# Patient Record
Sex: Female | Born: 1970 | Race: Black or African American | Hispanic: No | Marital: Single | Smoking: Never smoker
Health system: Southern US, Community
[De-identification: ages and names within clinical notes are randomized; demographics above are authoritative.]

## PROBLEM LIST (undated history)

## (undated) ENCOUNTER — Ambulatory Visit: Source: Home / Self Care

## (undated) DIAGNOSIS — I219 Acute myocardial infarction, unspecified: Secondary | ICD-10-CM

## (undated) DIAGNOSIS — C50911 Malignant neoplasm of unspecified site of right female breast: Secondary | ICD-10-CM

## (undated) DIAGNOSIS — Z803 Family history of malignant neoplasm of breast: Secondary | ICD-10-CM

## (undated) DIAGNOSIS — Z171 Estrogen receptor negative status [ER-]: Secondary | ICD-10-CM

## (undated) HISTORY — DX: Family history of malignant neoplasm of breast: Z80.3

## (undated) HISTORY — DX: Estrogen receptor negative status (ER-): Z17.1

## (undated) HISTORY — DX: Acute myocardial infarction, unspecified: I21.9

## (undated) HISTORY — DX: Malignant neoplasm of unspecified site of right female breast: C50.911

---

## 2006-04-20 ENCOUNTER — Emergency Department: Payer: Self-pay | Admitting: Internal Medicine

## 2009-11-18 ENCOUNTER — Emergency Department: Payer: Self-pay | Admitting: Unknown Physician Specialty

## 2015-08-19 DIAGNOSIS — C50911 Malignant neoplasm of unspecified site of right female breast: Secondary | ICD-10-CM

## 2015-08-19 HISTORY — DX: Malignant neoplasm of unspecified site of right female breast: C50.911

## 2017-05-19 ENCOUNTER — Ambulatory Visit
Admission: RE | Admit: 2017-05-19 | Discharge: 2017-05-19 | Disposition: A | Discharge status: Home / Self Care | Payer: Self-pay | Source: Ambulatory Visit | Attending: Oncology | Admitting: Oncology

## 2017-05-19 ENCOUNTER — Other Ambulatory Visit: Payer: Self-pay | Admitting: *Deleted

## 2017-05-19 ENCOUNTER — Ambulatory Visit: Payer: Self-pay | Attending: Oncology | Admitting: *Deleted

## 2017-05-19 ENCOUNTER — Encounter (INDEPENDENT_AMBULATORY_CARE_PROVIDER_SITE_OTHER): Payer: Self-pay

## 2017-05-19 ENCOUNTER — Encounter: Payer: Self-pay | Admitting: *Deleted

## 2017-05-19 VITALS — BP 117/68 | HR 72 | Temp 98.6°F | Ht 65.0 in | Wt 143.0 lb

## 2017-05-19 DIAGNOSIS — N63 Unspecified lump in unspecified breast: Secondary | ICD-10-CM

## 2017-05-19 NOTE — Progress Notes (Signed)
Subjective:     Patient ID: Kelsey Klein, female   DOB: 11/29/1970, 46 y.o.   MRN: [id number]  HPI   Review of Systems     Objective:   Physical Exam  Pulmonary/Chest: Right breast exhibits tenderness. Right breast exhibits no inverted nipple, no mass, no nipple discharge and no skin change. Left breast exhibits mass and tenderness. Left breast exhibits no inverted nipple, no nipple discharge and no skin change. Breasts are asymmetrical.         Assessment:     46 year old Black female referred to [facility] by Glenford Bayley, PA at the [facility] for further evaluation of a left breast mass.  Patient states she found the mass about 2 months ago on self exam and went to the clinic to have the area examined.  Denies any tenderness at the site of the lump, but states she has always had tender breast.  No family history of breast cancer.  On clinical breast exam bilateral breast are tender to palpation.  I can visually see a raised area at 9:00 left breast on visual inspection.  Bilateral breast are very dense to palpation.  I can palpate an approximate 2X2 cm non-mobile, non-tender nodule at 9:00 left breast. Taught self breast awareness.  Last pap on 03/25/17 was negative, with no HPV co-testing.  Next pap due in 3 years.  Patient has been screened for eligibility.  She does not have any insurance, Medicare or Medicaid.  She also meets financial eligibility.  Hand-out given on the Affordable Care Act.    Plan:     Bilateral diagnostic mammogram and ultrasound ordered.  If no finding on imaging will refer for surgical consultation.  Patient is agreeable to the plan.  Will follow-up per [facility] protocol.

## 2017-05-19 NOTE — Patient Instructions (Signed)
Gave patient hand-out, Women Staying Healthy, Active and Well from [facility], with education on breast health, pap smears, heart and colon health. 

## 2017-05-27 ENCOUNTER — Ambulatory Visit
Admission: RE | Admit: 2017-05-27 | Discharge: 2017-05-27 | Disposition: A | Discharge status: Home / Self Care | Payer: Self-pay | Source: Ambulatory Visit | Attending: Oncology | Admitting: Oncology

## 2017-05-27 ENCOUNTER — Other Ambulatory Visit: Payer: Self-pay | Admitting: *Deleted

## 2017-05-27 DIAGNOSIS — N63 Unspecified lump in unspecified breast: Secondary | ICD-10-CM

## 2017-05-27 HISTORY — PX: BREAST BIOPSY: SHX20

## 2017-05-28 ENCOUNTER — Telehealth: Payer: Self-pay | Admitting: *Deleted

## 2017-05-28 NOTE — Telephone Encounter (Signed)
Called patient and informed her biopsy results were in and that I'd like for her to come in tomorrow to discuss them.  She is scheduled to come in at 8:00 in the morning.  I will have her complete [facility] Medicaid application at that time.

## 2017-05-29 ENCOUNTER — Encounter: Payer: Self-pay | Admitting: *Deleted

## 2017-05-29 NOTE — Progress Notes (Signed)
  Oncology Nurse Navigator Documentation  Navigator Location: (P) [facility] (05/29/17 0900) Referral date to RadOnc/MedOnc: (P) 05/26/17 (05/29/17 0900) )Navigator Encounter Type: (P) Other (05/29/17 0900)   Abnormal Finding Date: (P) 05/19/17 (05/29/17 0900) Confirmed Diagnosis Date: (P) 05/27/17 (05/29/17 0900)                   Barriers/Navigation Needs: (P) Coordination of Care (05/29/17 0900)   Interventions: (P) Coordination of Care;Education (05/29/17 0900)   Coordination of Care: (P) Appts (05/29/17 0900) Education Method: (P) Verbal;Written (05/29/17 0900)                Time Spent with Patient: (P) 45 (05/29/17 0900)   Met with patient today to discuss her biopsy results.  Informed her of her positive results.  Offered support.  Gave patient breast cancer educational literature, "My Breast Cancer Treatment Handbook" by Josephine Igo, RN.  I have scheduled her to see Dr. Grayland Ormond on Monday 06/02/17 @ 10:00 and Dr. Bary Castilla at 1:45.  She is to call if she has any questions or needs.

## 2017-05-30 LAB — SURGICAL PATHOLOGY

## 2017-05-31 DIAGNOSIS — C50512 Malignant neoplasm of lower-outer quadrant of left female breast: Secondary | ICD-10-CM | POA: Insufficient documentation

## 2017-05-31 NOTE — Progress Notes (Signed)
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  INTERVAL HISTORY: Patient is a 46 year old female who palpated a lump in her breast approximately 3 months ago.  Patient noted it was not resolving and may be slightly larger and proceeded to get mammogram and ultrasound.  Subsequent biopsy revealed the above-stated breast cancer.  She is currently anxious, but otherwise feels well.  She has no neurologic complaints.  She denies any recent fevers.  She has a good appetite and denies weight loss.  She has no chest pain or shortness of breath.  She denies any nausea, vomiting, constipation, or diarrhea.  She has no urinary complaints.  Patient otherwise feels well and offers no further specific complaints.  REVIEW OF SYSTEMS:   Review of Systems  Constitutional: Negative.  Negative for fever, malaise/fatigue and weight loss.  Respiratory: Negative.  Negative for cough and shortness of breath.   Cardiovascular: Negative.  Negative for chest pain and leg swelling.  Gastrointestinal: Negative for abdominal pain.  Genitourinary: Negative.   Musculoskeletal: Negative.   Skin: Negative.   Neurological: Negative.  Negative for weakness.  Psychiatric/Behavioral: The patient is nervous/anxious.     As per HPI. Otherwise, a complete review of systems is negative.  PAST MEDICAL HISTORY: History reviewed. No pertinent past medical history.  PAST SURGICAL HISTORY: Past Surgical History:  Procedure Laterality Date  . BREAST BIOPSY Left 05/27/2017   U/S core Bx- path pending  . BREAST BIOPSY Left 05/27/2017   Affirm Bx- path pending    FAMILY HISTORY: Family History    Problem Relation Age of Onset  . Arthritis Mother   . Cirrhosis Father   . Hypertension Father   . Hypertension Sister   . Alzheimer's disease Maternal Grandmother   . Breast cancer Neg Hx     ADVANCED DIRECTIVES (Y/N):  N  HEALTH MAINTENANCE: Social History   Tobacco Use  . Smoking status: Never Smoker  . Smokeless tobacco: Never Used  Substance Use Topics  . Alcohol use: Yes  . Drug use: No     Colonoscopy:  PAP:  Bone density:  Lipid panel:  No Known Allergies  Current Outpatient Medications  Medication Sig Dispense Refill  . Norgestim-Eth Estrad Triphasic (ORTHO TRI-CYCLEN, 28, PO) Take by mouth.     No current facility-administered medications for this visit.     OBJECTIVE: Vitals:   06/02/17 1013  BP: 124/87  Pulse: 69  Resp: 18  Temp: 97.9 F (36.6 C)     Body mass index is 24.55 kg/m.    ECOG FS:0 - Asymptomatic  General: Well-developed, well-nourished, no acute distress. Eyes: Pink conjunctiva, anicteric sclera. HEENT: Normocephalic, moist mucous membranes, clear oropharnyx. Breast: Deferred today. Lungs: Clear to auscultation bilaterally. Heart: Regular rate and rhythm. No rubs, murmurs, or gallops. Abdomen: Soft, nontender, nondistended. No organomegaly noted, normoactive bowel sounds. Musculoskeletal: No edema, cyanosis, or clubbing. Neuro: Alert, answering all questions appropriately. Cranial nerves grossly intact. Skin: No rashes or petechiae noted. Psych: Normal affect. Lymphatics: No cervical, calvicular, axillary or inguinal LAD.   LAB RESULTS:  No results found for: NA, K, CL, CO2, GLUCOSE, BUN, CREATININE, CALCIUM, PROT, ALBUMIN, AST, ALT, ALKPHOS, BILITOT, GFRNONAA, GFRAA  No results  found for: WBC, NEUTROABS, HGB, HCT, MCV, PLT   STUDIES: US Breast Ltd Uni Left Inc Axilla  Result Date: 05/19/2017 CLINICAL DATA:  46 year old female with palpable left breast lump for approximately 2 months. EXAM: 2D DIGITAL DIAGNOSTIC  BILATERAL MAMMOGRAM WITH CAD AND ADJUNCT TOMO ULTRASOUND BILATERAL BREAST COMPARISON:  None. ACR Breast Density Category d: The breast tissue is extremely dense, which lowers the sensitivity of mammography. FINDINGS: A radiopaque BB was placed at the site of the patient's palpable abnormality in the lower inner left breast at middle depth. Pleomorphic calcifications in a linear distribution are seen deep to the radiopaque BB. They span a total of 5 cm posterior to anterior. Additionally, there is a spiculated mass and focal distortion on 3D views within the center of the calcifications. Further evaluation with ultrasound was performed. An asymmetry in the superior right breast at posterior depth on the MLO projection likely represents fibroglandular tissue on additional views. A precautionary ultrasound was performed. Coarse calcifications in the bilateral axillary lesions localized to the skin on tomosynthesis views. No other focal mammographic abnormalities are identified in either breast. Mammographic images were processed with CAD. On physical exam, I palpate an irregular, firm immobile lump in the medial left breast. Targeted ultrasound is performed, showing an irregular, spiculated hypoechoic mass with internal calcifications at the 9 o'clock position 6 cm from the nipple on the left. It measures 2.0 x 1.9 x 1.1 cm. Note is made of internal vascularity. Evaluation of the left axilla demonstrates a large vessel with slow but measurable blood flow. No suspicious lymph nodes are identified. Evaluation of the right upper outer quadrant demonstrates normal fibroglandular tissue without suspicious sonographic findings. IMPRESSION: 1. Highly suspicious left breast mass corresponding with the patient's palpable abnormality. Pleomorphic calcifications in a linear distribution extent anterior and posterior to the mass spanning approximately 5 cm. Recommendation is for a 2 area biopsy to demonstrate extent of disease. 2.  No suspicious left axillary lymphadenopathy. 3. No evidence of malignancy on the right. RECOMMENDATION: 1. Ultrasound-guided biopsy of the suspicious left breast mass at the 9 o'clock position. 2. Stereotactic biopsy of an additional group of anterior calcifications to document extent of disease. 3. If findings are consistent with malignancy, consider further evaluation with MRI for additional extent of disease given the patient's extreme breast density. If additional target is not found by MRI, and breast conservation therapy is considered, recommendation is to bracket the entire anterior and posterior extent of calcifications at the time of localization. Alternatively, additional stereotactic biopsy of posterior extent of calcifications can be considered. I have discussed the findings and recommendations with the patient. Results were also provided in writing at the conclusion of the visit. If applicable, a reminder letter will be sent to the patient regarding the next appointment. BI-RADS CATEGORY  5: Highly suggestive of malignancy. Electronically Signed   By: Kristopher Oppenheim M.D.   On: 05/19/2017 13:37   US Breast Ltd Uni Right Inc Axilla  Result Date: 05/19/2017 CLINICAL DATA:  46 year old female with palpable left breast lump for approximately 2 months. EXAM: 2D DIGITAL DIAGNOSTIC BILATERAL MAMMOGRAM WITH CAD AND ADJUNCT TOMO ULTRASOUND BILATERAL BREAST COMPARISON:  None. ACR Breast Density Category d: The breast tissue is extremely dense, which lowers the sensitivity of mammography. FINDINGS: A radiopaque BB was placed at the site of the patient's palpable abnormality in the lower inner left breast at middle depth. Pleomorphic calcifications in a linear distribution are seen deep to the radiopaque BB. They span a  total of 5 cm posterior to anterior. Additionally, there is a spiculated mass and focal distortion on 3D views within the center of the calcifications. Further evaluation with ultrasound was  performed. An asymmetry in the superior right breast at posterior depth on the MLO projection likely represents fibroglandular tissue on additional views. A precautionary ultrasound was performed. Coarse calcifications in the bilateral axillary lesions localized to the skin on tomosynthesis views. No other focal mammographic abnormalities are identified in either breast. Mammographic images were processed with CAD. On physical exam, I palpate an irregular, firm immobile lump in the medial left breast. Targeted ultrasound is performed, showing an irregular, spiculated hypoechoic mass with internal calcifications at the 9 o'clock position 6 cm from the nipple on the left. It measures 2.0 x 1.9 x 1.1 cm. Note is made of internal vascularity. Evaluation of the left axilla demonstrates a large vessel with slow but measurable blood flow. No suspicious lymph nodes are identified. Evaluation of the right upper outer quadrant demonstrates normal fibroglandular tissue without suspicious sonographic findings. IMPRESSION: 1. Highly suspicious left breast mass corresponding with the patient's palpable abnormality. Pleomorphic calcifications in a linear distribution extent anterior and posterior to the mass spanning approximately 5 cm. Recommendation is for a 2 area biopsy to demonstrate extent of disease. 2. No suspicious left axillary lymphadenopathy. 3. No evidence of malignancy on the right. RECOMMENDATION: 1. Ultrasound-guided biopsy of the suspicious left breast mass at the 9 o'clock position. 2. Stereotactic biopsy of an additional group of anterior calcifications to document extent of disease. 3. If findings are consistent with malignancy, consider further evaluation with MRI for additional extent of disease given the patient's extreme breast density. If additional target is not found by MRI, and breast conservation therapy is considered, recommendation is to bracket the entire anterior and posterior extent of  calcifications at the time of localization. Alternatively, additional stereotactic biopsy of posterior extent of calcifications can be considered. I have discussed the findings and recommendations with the patient. Results were also provided in writing at the conclusion of the visit. If applicable, a reminder letter will be sent to the patient regarding the next appointment. BI-RADS CATEGORY  5: Highly suggestive of malignancy. Electronically Signed   By: Kristopher Oppenheim M.D.   On: 05/19/2017 13:37   Ms Digital Diag Tomo Bilat  Result Date: 05/19/2017 CLINICAL DATA:  46 year old female with palpable left breast lump for approximately 2 months. EXAM: 2D DIGITAL DIAGNOSTIC BILATERAL MAMMOGRAM WITH CAD AND ADJUNCT TOMO ULTRASOUND BILATERAL BREAST COMPARISON:  None. ACR Breast Density Category d: The breast tissue is extremely dense, which lowers the sensitivity of mammography. FINDINGS: A radiopaque BB was placed at the site of the patient's palpable abnormality in the lower inner left breast at middle depth. Pleomorphic calcifications in a linear distribution are seen deep to the radiopaque BB. They span a total of 5 cm posterior to anterior. Additionally, there is a spiculated mass and focal distortion on 3D views within the center of the calcifications. Further evaluation with ultrasound was performed. An asymmetry in the superior right breast at posterior depth on the MLO projection likely represents fibroglandular tissue on additional views. A precautionary ultrasound was performed. Coarse calcifications in the bilateral axillary lesions localized to the skin on tomosynthesis views. No other focal mammographic abnormalities are identified in either breast. Mammographic images were processed with CAD. On physical exam, I palpate an irregular, firm immobile lump in the medial left breast. Targeted ultrasound is performed, showing an irregular, spiculated hypoechoic  mass with internal calcifications at the 9  o'clock position 6 cm from the nipple on the left. It measures 2.0 x 1.9 x 1.1 cm. Note is made of internal vascularity. Evaluation of the left axilla demonstrates a large vessel with slow but measurable blood flow. No suspicious lymph nodes are identified. Evaluation of the right upper outer quadrant demonstrates normal fibroglandular tissue without suspicious sonographic findings. IMPRESSION: 1. Highly suspicious left breast mass corresponding with the patient's palpable abnormality. Pleomorphic calcifications in a linear distribution extent anterior and posterior to the mass spanning approximately 5 cm. Recommendation is for a 2 area biopsy to demonstrate extent of disease. 2. No suspicious left axillary lymphadenopathy. 3. No evidence of malignancy on the right. RECOMMENDATION: 1. Ultrasound-guided biopsy of the suspicious left breast mass at the 9 o'clock position. 2. Stereotactic biopsy of an additional group of anterior calcifications to document extent of disease. 3. If findings are consistent with malignancy, consider further evaluation with MRI for additional extent of disease given the patient's extreme breast density. If additional target is not found by MRI, and breast conservation therapy is considered, recommendation is to bracket the entire anterior and posterior extent of calcifications at the time of localization. Alternatively, additional stereotactic biopsy of posterior extent of calcifications can be considered. I have discussed the findings and recommendations with the patient. Results were also provided in writing at the conclusion of the visit. If applicable, a reminder letter will be sent to the patient regarding the next appointment. BI-RADS CATEGORY  5: Highly suggestive of malignancy. Electronically Signed   By: Kristopher Oppenheim M.D.   On: 05/19/2017 13:37   Ms Digital Diag Uni Left  Result Date: 05/27/2017 CLINICAL DATA:  Post stereotactic and ultrasound-guided core needle biopsy of  left breast 9 o'clock mass and an area of calcifications in the lower inner quadrant of the left breast EXAM: DIAGNOSTIC LEFT MAMMOGRAM POST ULTRASOUND IN STEREOTACTIC CORE NEEDLE BIOPSY COMPARISON:  Previous exam(s). FINDINGS: Mammographic images were obtained following ultrasound-guided and stereotactic guided biopsy of left breast. Ultrasound-guided biopsy of left breast 9 o'clock mass containing calcifications was performed, with the wing shaped marker corresponding to the biopsy site. Stereotactic core needle biopsy of suspicious calcifications in the lower inner quadrant of the left breast was performed, corresponding to the X shaped marker. The 2 markers are 2.4 cm apart, with the biopsy sites representing the anterior and posterior aspect of malignant appearing process. There is however a separate group of indeterminate calcifications in the slightly upper inner left breast, posterior depth, which measures 1.9 cm in AP dimension. Stereotactic core needle biopsy for this group of calcifications is recommended, should breast conservation therapy is chosen. IMPRESSION: Expected post biopsy changes, post ultrasound-guided core needle biopsy of the left breast 9 o'clock mass -wing shaped marker, and a group of calcifications in the left breast lower inner quadrant -X shaped marker. A separate group of calcifications in the left breast slightly upper inner quadrant, posterior depth, for which stereotactic core needle biopsy is recommended, should breast conservation therapy is chosen. Final Assessment: Post Procedure Mammograms for Marker Placement Electronically Signed   By: Fidela Salisbury M.D.   On: 05/27/2017 14:43   Mm Lt Breast Bx W Loc Dev 1st Lesion Image Bx Spec Stereo Guide  Addendum Date: 05/30/2017   ADDENDUM REPORT: 05/29/2017 17:13 ADDENDUM: Pathology of the left breast stereotactic guided biopsy revealed B. 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 Comment: These findings were communicated to [place] in the [facility] on 05/28/17 at 2:27 PM. Read-back was performed. This was found to be concordant with Dr. Kateri Plummer impression and notes. Recommendation: Surgical and oncology referrals. Please note, clips are 2.4 cm apart, likely within the same malignancy, and represent the anterior and posterior portion of it. Bracketed NL will be needed if excision is chosen. There is a separate group of calcifications in the slightly UIQ of the left breast. A stereotactic biopsy for it is recommended, if breast conservation treatment is considered. Results were relayed to Tanya Nones, RN, nurse navigator and [facility] coordinator for [facility]. Results were given to the patient in person by Tanya Nones, RN. A surgical appointment was made for the patient with Dr. Bary Castilla for 06/02/17 at 2:00 PM and oncology appointment with Dr. Grayland Ormond for 06/02/17 at 10:00 AM. The patient was encouraged to contact Tanya Nones, RN with any further questions or concerns. Addendum by Jetta Lout, RRA on 05/29/17. Electronically Signed   By: Fidela Salisbury M.D.   On: 05/29/2017 17:13   Result Date: 05/30/2017 CLINICAL DATA:  Left breast lower inner quadrant group of calcifications. EXAM: LEFT BREAST STEREOTACTIC CORE NEEDLE BIOPSY COMPARISON:  Previous exams. FINDINGS: The patient and I discussed the procedure of stereotactic-guided biopsy including benefits and alternatives. We discussed the high likelihood of a successful procedure. We discussed the risks of the procedure including infection, bleeding, tissue injury, clip migration, and inadequate sampling. Informed written consent was given. The usual time out protocol was performed immediately prior to the procedure. Using sterile technique and 1% Lidocaine as  local anesthetic, under stereotactic guidance, a 9 gauge vacuum assisted device was used to perform core needle biopsy of calcifications in the lower inner quadrant of the left breast using a medial approach. Specimen radiograph was performed showing presence of calcifications. Specimens with calcifications are identified for pathology. Lesion quadrant: Lower inner quadrant At the conclusion of the procedure, a X shaped tissue marker clip was deployed into the biopsy cavity. Follow-up 2-view mammogram was performed and dictated separately. IMPRESSION: Stereotactic-guided biopsy of left breast lower inner quadrant calcifications. No apparent complications. Electronically Signed: By: Fidela Salisbury M.D. On: 05/27/2017 16:11   [place] Lt Breast Bx W Loc Dev 1st Lesion Img Bx Spec US Guide  Addendum Date: 05/30/2017   ADDENDUM REPORT: 05/29/2017 17:21 ADDENDUM: Pathology of the left breast ultrasound guided biopsy revealed A. BREAST, LEFT, 9 O'CLOCK 6 CM FROM NIPPLE; ULTRASOUND-GUIDED CORE BIOPSY: INVASIVE MAMMARY CARCINOMA, NO SPECIAL TYPE. Size of invasive carcinoma: 11 mm in this sample Histologic grade of invasive carcinoma: Grade 1. Ductal carcinoma in situ: Present, high nuclear grade with comedonecrosis and calcifications. Lymphovascular invasion: Not identified. ER/PR/HER2: Immunohistochemistry will be performed on block A1, with reflex to [place] for HER2 2+. The results will be reported in an addendum. Comment: These findings were communicated to [place] in the [facility] on 05/28/17 at 2:27 PM. Read-back was performed. This was found to be concordant with Dr. Kateri Plummer impression and notes. Recommendation: Surgical and oncology referrals. Please note, clips are 2.4 cm apart, likely within the same malignancy, and represent the anterior and posterior portion of it. Bracketed NL will be needed if excision is chosen. There is a separate group of calcifications in the slightly UIQ  of the left breast. A stereotactic biopsy for it is recommended, if breast conservation treatment is considered. Results were  relayed to Tanya Nones, RN, nurse navigator and [facility] coordinator. Results were given to the patient by Tanya Nones, RN in person on 05/29/17. A surgical referral was made with Dr. Bary Castilla on 06/02/17 at 2:00 PM and an oncology referral with Dr. Grayland Ormond on 06/02/17 at 10:00 AM by Tanya Nones, RN. The patient was encouraged to contact Tanya Nones, RN with any further questions or concerns. Addendum by Jetta Lout, RRA on 05/29/17. Electronically Signed   By: Fidela Salisbury M.D.   On: 05/29/2017 17:21   Result Date: 05/30/2017 CLINICAL DATA:  Left breast 9 o'clock suspicious calcifications containing mass. EXAM: ULTRASOUND GUIDED LEFT BREAST CORE NEEDLE BIOPSY COMPARISON:  Previous exam(s). FINDINGS: I met with the patient and we discussed the procedure of ultrasound-guided biopsy, including benefits and alternatives. We discussed the high likelihood of a successful procedure. We discussed the risks of the procedure, including infection, bleeding, tissue injury, clip migration, and inadequate sampling. Informed written consent was given. The usual time-out protocol was performed immediately prior to the procedure. Lesion quadrant: Lower inner quadrant. Using sterile technique and 1% Lidocaine as local anesthetic, under direct ultrasound visualization, a 14 gauge spring-loaded device was used to perform biopsy of left breast 9 o'clock mass using a inferior approach. At the conclusion of the procedure a wing shaped tissue marker clip was deployed into the biopsy cavity. Follow up 2 view mammogram was performed and dictated separately. IMPRESSION: Ultrasound guided biopsy of left breast mass. No apparent complications. Electronically Signed: By: Fidela Salisbury M.D. On: 05/27/2017 16:10    ASSESSMENT: Clinical stage Ib ER PR positive, HER-2 negative invasive lower  outer quadrant the left breast.  PLAN:    1. 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  Given the extent of DCIS, have ordered a breast MRI for further evaluation.  Patient has consultation with surgery later today to discuss treatment options, although have mentioned to the patient that she likely will require neoadjuvant chemotherapy with Adriamycin, Cytoxan, and Taxol.  Also briefly discussed possible plastic surgery for reconstruction if patient chooses mastectomy.  Prior to initiating treatment, she will require port placement.  Have also recommended genetic counseling, which patient wishes to defer until later date.  A MUGA scan has been ordered for pretreatment evaluation of her cardiac function.  Adjuvant XRT was briefly discussed today, but this will depend on whether she has a lumpectomy or mastectomy.  Finally, patient will benefit from tamoxifen for a total of 5-10 years at the completion of all her treatments.  No follow-up was scheduled today, will await surgical input as well as MRI results to determine if patient should receive adjuvant or neoadjuvant chemotherapy.  Approximately 60 minutes was spent in discussion of which greater than 50% was consultation.  Patient expressed understanding and was in agreement with this plan. She also understands that She can call clinic at any time with any questions, concerns, or complaints.   Cancer Staging Primary cancer of lower-outer quadrant of left breast [facility]) Staging form: Breast, AJCC 8th Edition - Clinical stage from 05/31/2017: Stage IB (cT2, cN0, cM0, G1, ER: Positive, PR: Positive, HER2: Negative) - Signed by Lloyd Huger, MD on 05/31/2017   Lloyd Huger, MD   06/02/2017 1:37 PM

## 2017-06-02 ENCOUNTER — Encounter: Payer: Self-pay | Admitting: Oncology

## 2017-06-02 ENCOUNTER — Telehealth: Payer: Self-pay | Admitting: *Deleted

## 2017-06-02 ENCOUNTER — Other Ambulatory Visit: Payer: Self-pay | Admitting: *Deleted

## 2017-06-02 ENCOUNTER — Encounter: Payer: Self-pay | Admitting: General Surgery

## 2017-06-02 ENCOUNTER — Inpatient Hospital Stay: Payer: Medicaid Other | Attending: Oncology | Admitting: Oncology

## 2017-06-02 ENCOUNTER — Ambulatory Visit (INDEPENDENT_AMBULATORY_CARE_PROVIDER_SITE_OTHER): Payer: Self-pay | Admitting: General Surgery

## 2017-06-02 VITALS — BP 128/72 | HR 78 | Resp 14 | Ht 64.0 in | Wt 141.0 lb

## 2017-06-02 DIAGNOSIS — Z170 Estrogen receptor positive status [ER+]: Secondary | ICD-10-CM

## 2017-06-02 DIAGNOSIS — Z793 Long term (current) use of hormonal contraceptives: Secondary | ICD-10-CM | POA: Diagnosis not present

## 2017-06-02 DIAGNOSIS — C50512 Malignant neoplasm of lower-outer quadrant of left female breast: Secondary | ICD-10-CM | POA: Insufficient documentation

## 2017-06-02 DIAGNOSIS — F419 Anxiety disorder, unspecified: Secondary | ICD-10-CM | POA: Diagnosis not present

## 2017-06-02 DIAGNOSIS — C50312 Malignant neoplasm of lower-inner quadrant of left female breast: Secondary | ICD-10-CM

## 2017-06-02 NOTE — Progress Notes (Signed)
Patient ID: Kelsey Klein, female   DOB: 09/19/1970, 46 y.o.   MRN: [id number]  Chief Complaint  Patient presents with  . Other    HPI Kelsey Klein is a 46 y.o. female who presents for a breast evaluation. The most recent mammogram was done on 05/19/2017 and biopsy on 05/27/2017.Patient does perform regular self breast checks. This was her first mammogram. She had felt a knot in her left breast 3 months prior to this. She states that the area has not changed. She denies any pain or nipple discharge. She is here today with her friend [name].    The patient is a hair stylist. She has a 42 year old daughter whose setting to be a Psychologist, sport and exercise at [employer].     HPI  History reviewed. No pertinent past medical history.  Past Surgical History:  Procedure Laterality Date  . BREAST BIOPSY Left 05/27/2017   U/S core Bx- path pending  . BREAST BIOPSY Left 05/27/2017   Affirm Bx- path pending    Family History  Problem Relation Age of Onset  . Arthritis Mother   . Cirrhosis Father   . Hypertension Father   . Hypertension Sister   . Alzheimer's disease Maternal Grandmother   . Breast cancer Neg Hx     Social History Social History   Tobacco Use  . Smoking status: Never Smoker  . Smokeless tobacco: Never Used  Substance Use Topics  . Alcohol use: Yes  . Drug use: No    No Known Allergies  Current Outpatient Medications  Medication Sig Dispense Refill  . Norgestim-Eth Estrad Triphasic (ORTHO TRI-CYCLEN, 28, PO) Take by mouth.     No current facility-administered medications for this visit.     Review of Systems Review of Systems  Constitutional: Negative.   Respiratory: Negative.   Cardiovascular: Negative.     Blood pressure 128/72, pulse 78, resp. rate 14, height 5' 4"  (1.626 m), weight 141 lb (64 kg), last menstrual period 05/06/2017.  Physical Exam Physical Exam  Constitutional: She is oriented to person, place, and time. She appears well-developed and  well-nourished.  Eyes: Conjunctivae are normal. No scleral icterus.  Neck: Neck supple.  Cardiovascular: Normal rate, regular rhythm and normal heart sounds.  Pulmonary/Chest: Effort normal and breath sounds normal. Right breast exhibits no inverted nipple, no mass, no nipple discharge, no skin change and no tenderness. Left breast exhibits mass (little thickening at 8:00 o'clk at the areola). Left breast exhibits no inverted nipple, no nipple discharge, no skin change and no tenderness.    The patient reports that her right breast is smaller than the left, "A" cup on the right, "B" cup on the left.  Lymphadenopathy:    She has no cervical adenopathy.    She has no axillary adenopathy.  Neurological: She is alert and oriented to person, place, and time.  Skin: Skin is warm and dry.  Psychiatric: She has a normal mood and affect.    Data Reviewed DIAGNOSIS:  A. BREAST, LEFT, 9 O'CLOCK 6 CM FROM NIPPLE; ULTRASOUND-GUIDED CORE  BIOPSY:  - INVASIVE MAMMARY CARCINOMA, NO SPECIAL TYPE.  Size of invasive carcinoma: 11 mm in this sample  Histologic grade of invasive carcinoma: Grade 1  Ductal carcinoma in situ: Present, high nuclear grade with  comedonecrosis and calcifications  Lymphovascular invasion: Not identified   B. BREAST, LEFT, LOWER INNER QUADRANT; STEREOTACTIC-GUIDED CORE BIOPSY:  - DUCTAL CARCINOMA IN SITU (DCIS), HIGH NUCLEAR GRADE WITH  COMEDONECROSIS AND CALCIFICATIONS, IN MULTIPLE CORE  SAMPLES.   BREAST BIOMARKER TEST RESULTS:  Estrogen Receptor (ER) Status: POSITIVE, >90% of cells with nuclear  positivity     Average intensity of staining: Moderate  Progesterone Receptor (PgR) Status: POSITIVE, >90% of cells with nuclear  positivity     Average intensity of staining: Strong  HER2 (by immunohistochemistry): NEGATIVE (Score 1+)   Mammograms an ultrasound of 05/19/2017, postprocedural mammogram of 05/27/2017 reviewed. There is however a separate group of  indeterminate calcifications in the slightly upper inner left breast, posterior depth, which measures 1.9 cm in AP dimension. Stereotactic core needle biopsy for this group of calcifications is recommended, should breast conservation therapy is chosen.  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    Plan     The majority of the visit was spent reviewing the options for breast cancer treatment. Breast conservation with lumpectomy and radiation therapy  was presented as equivalent to mastectomy for long-term control. The pros and cons of each treatment regimen were reviewed. The indications for additional therapy such as chemotherapy were touched on briefly,as the patient had met with medical oncology earlier today.  I spoke with Dr. Grayland Ormond after the patient's visit. MRIs not really required at this time as it will not necessarily change our options but would raise her likelihood of mastectomy.  The patient's tumor is just under 2 cm, and I'm not convinced that neoadjuvant chemotherapy would make her break the potential for breast conservation. I spoke with Dr. Grayland Ormond about whether Mammoprint or Oncotype testing would be appropriate, but he reported based on her age he would likely offer and encourage adjuvant chemotherapy regardless of any scoring platform.  The additional foci of microcalcifications can likely be included in the wide excision(if the patient chooses breast conservation) and I would mandate a separate biopsy of this area.  The potential for plastic surgery consultation should she choose simple mastectomy with or without immediate reconstruction was discussed.  The importance of taking adequate time to consider all of her options was reviewed. Several pertinent website addresses were provided if she is interested in pursuing that route.  The risks associated with central venous access including arterial, pulmonary and  venous injury were reviewed. The possible need for additional treatment if pulmonary injury occurs (chest tube placement) was discussed. This would be completed at the time of any surgical intervention if the patient was amenable to adjuvant chemotherapy. An informational brochure was provided.  The patient was encouraged to call should she have any questions.  The patient is presently using oral contraceptives, and this would  be contraindicated with an estrogen positive tumor.     HPI, Physical Exam, Assessment and Plan have been scribed under the direction and in the presence of Robert Bellow, MD  Concepcion Living, LPN  I have completed the exam and reviewed the above documentation for accuracy and completeness.  I agree with the above.  Haematologist has been used and any errors in dictation or transcription are unintentional.  Hervey Ard, M.D., F.A.C.S.  Robert Bellow 06/02/2017, 7:15 PM

## 2017-06-02 NOTE — Telephone Encounter (Signed)
Saw patient this morning during her initial medical oncology consult with Dr. Grayland Ormond.  He would like for the patient to have a breast MRI.  Since the patient does not have insurance or Medicaid at this time, I have called and faxed approval for breast MRI through our [employer].  I left the patient a message to return my call to inform her that [place] will call her to schedule the appointment.

## 2017-06-03 ENCOUNTER — Telehealth: Payer: Self-pay | Admitting: *Deleted

## 2017-06-03 NOTE — Telephone Encounter (Signed)
Left message for patient to call the office back

## 2017-06-03 NOTE — Telephone Encounter (Signed)
-----   Message from Robert Bellow, MD sent at 06/02/2017  7:26 PM EST ----- Please notify the patient that I spoke with Dr. Grayland Ormond, and a breast MRI is not required before any surgical intervention.

## 2017-06-03 NOTE — Telephone Encounter (Signed)
Patient called back and is aware that you spoke with Dr.Finnegan and a breast MRI is not required before any surgical intervention

## 2017-06-04 ENCOUNTER — Telehealth: Payer: Self-pay | Admitting: General Surgery

## 2017-06-04 NOTE — Telephone Encounter (Signed)
Contacted the patient see if she had any questions about the options for management of her recently diagnosed breast cancer after meeting with Dr. Grayland Ormond and myself on Monday. She was informed that an MRI was not necessary for medical or surgical management, and was no longer recommended.  Leaning toward breast conservation at present, but still considering mastectomy.  Opportunity to meet with plastic surgeon offered, even if she does not go in that direction.  Encouraged to call if she has any questions, or if she wants to move forward with surgery (partial w/ RT to follow) or mastectomy (w/o immediate reconstruction).

## 2017-06-11 ENCOUNTER — Encounter
Admission: RE | Admit: 2017-06-11 | Discharge: 2017-06-11 | Disposition: A | Discharge status: Home / Self Care | Payer: Medicaid Other | Source: Ambulatory Visit | Attending: Oncology | Admitting: Oncology

## 2017-06-11 DIAGNOSIS — C50512 Malignant neoplasm of lower-outer quadrant of left female breast: Secondary | ICD-10-CM | POA: Insufficient documentation

## 2017-06-11 MED ORDER — TECHNETIUM TC 99M-LABELED RED BLOOD CELLS IV KIT
25.0000 | PACK | Freq: Once | INTRAVENOUS | Status: AC | PRN
  Administered 2017-06-11: 22.513 via INTRAVENOUS

## 2017-06-18 ENCOUNTER — Encounter: Payer: Self-pay | Admitting: *Deleted

## 2017-06-18 NOTE — Progress Notes (Signed)
  Oncology Nurse Navigator Documentation  Navigator Location: [facility] (06/18/17 1000) Referral date to RadOnc/MedOnc: 06/18/17 (06/18/17 1000) )Navigator Encounter Type: Telephone (06/18/17 1000) Telephone: Lahoma Crocker Call (06/18/17 1000)                     Treatment Phase: Pre-Tx/Tx Discussion (06/18/17 1000)     Interventions: Referrals (06/18/17 1000)                      Time Spent with Patient: 30 (06/18/17 1000)   Patient called and wanted to discuss her birth control options.  States she needs to refill her birth control pills.  Referred to her her gyn to discuss birth control options.  I did inform her that her breast cancer is estrogen and progesterone positive and her cancer is fueled by those hormones.  Stressed importance of telling her gyn that information.  States she has a follow-up appointment with Dr. Bary Castilla tomorrow to discuss surgery options again.  She is to call if she has any questions or needs.

## 2017-06-19 ENCOUNTER — Encounter: Payer: Self-pay | Admitting: General Surgery

## 2017-06-19 ENCOUNTER — Other Ambulatory Visit: Payer: Self-pay | Admitting: *Deleted

## 2017-06-19 ENCOUNTER — Ambulatory Visit (INDEPENDENT_AMBULATORY_CARE_PROVIDER_SITE_OTHER): Payer: Self-pay | Admitting: General Surgery

## 2017-06-19 VITALS — BP 128/74 | HR 70 | Resp 12 | Ht 64.0 in | Wt 143.0 lb

## 2017-06-19 DIAGNOSIS — Z170 Estrogen receptor positive status [ER+]: Secondary | ICD-10-CM

## 2017-06-19 DIAGNOSIS — C50512 Malignant neoplasm of lower-outer quadrant of left female breast: Secondary | ICD-10-CM

## 2017-06-19 NOTE — Progress Notes (Signed)
Patient ID: Kelsey Klein, female   DOB: 1970/07/22, 47 y.o.   MRN: [id number]  Chief Complaint  Patient presents with  . Follow-up    HPI Kelsey Klein is a 47 y.o. female.  Here to discuss breast surgery. She is currently off her birth control but plans on going back on them at the end of the month.  She is here with her friend, Kelsey Klein.  HPI  No past medical history on file.  Past Surgical History:  Procedure Laterality Date  . BREAST BIOPSY Left 05/27/2017   U/S core Bx- path pending  . BREAST BIOPSY Left 05/27/2017   Affirm Bx- path pending    Family History  Problem Relation Age of Onset  . Arthritis Mother   . Cirrhosis Father   . Hypertension Father   . Hypertension Sister   . Alzheimer's disease Maternal Grandmother   . Breast cancer Neg Hx     Social History Social History   Tobacco Use  . Smoking status: Never Smoker  . Smokeless tobacco: Never Used  Substance Use Topics  . Alcohol use: Yes  . Drug use: No    No Known Allergies  No current outpatient medications on file.   No current facility-administered medications for this visit.     Review of Systems Review of Systems  Constitutional: Negative.   Respiratory: Negative.   Cardiovascular: Negative.     Blood pressure 128/74, pulse 70, resp. rate 12, height 5\' 4"  (1.626 m), weight 143 lb (64.9 kg), last menstrual period 06/05/2017.       Assessment    Left breast cancer    Plan    The patient open today's conversation bringing up the potential for bilateral mastectomy with immediate non-prosthetic-based reconstruction.  The impetus for contralateral mastectomy was based on conversation with friends and the perceived risk of contralateral cancer.  We discussed the recommendation from the American Society of breast surgeons that this does not result in prolonged life as the risk of contralateral cancer is modest at best.  We briefly touched on the complex the of the simple  mastectomy versus mastectomy with prosthetic reconstruction versus autologous reconstruction unilateral or bilateral.  Surgery risks are certainly increased with more extensive procedures.  When I spoke with Dr. Grayland Ormond for medical oncology he was planning to recommend and encourage adjuvant chemotherapy regardless of nodal status.  He has listed her as a T2 lesion based on the mammographic microcalcifications, which measured 5 cm, but the ultrasound abnormality measures only 2.0 cm and no distinct mammographic abnormality is appreciated.  This would still be a T1c lesion.  The patient and her friend raised the question if she had a mastectomy if this would remove the recommendation for chemotherapy.  I discussed with her the chemotherapy is designed to deal with micrometastatic disease that is not clinically detectable and the recommendation would be regardless of whether she chose breast conservation or mastectomy (unilateral or bilateral).  The potential for delayed initiation of chemotherapy if this were to be administered should she undertake major breast reconstruction was briefly touched upon.  The potential advantage of mastectomy as it would certainly include the tiny area of microcalcifications for the which the radiologist recommended biopsy prior to breast conservation, which I believe can be encompassed in a wide excision should she go this route.  The left breast is approximately 1/2-1 cup size larger than the right and would likely result in symmetry more than anything else.  I think she  would do well to meet with the plastic surgery service and arrangements will be made for her to speak with Audelia Hives, DO in regards to her surgical options for breast reconstruction.  The patient plans on contacting Dr. Grayland Ormond in regards to his recommendations for adjuvant chemotherapy as they were left with the impression that this was still an open question rather than a firm  recommendation.  The patient is asking the right questions and I anticipate she will do well after her meeting with plastic surgery coming up with the treatment plan..     We spent about 40 minutes reviewing surgical options.  Kelsey Klein 06/19/2017, 6:51 PM

## 2017-06-22 NOTE — Progress Notes (Signed)
[place]  [phone] [phone]  ID: [name] OB: [birth year]  MR#: [id number]  XTG#:[id number]  Patient Care Team: [facility] [place] as PCP - General (General Practice) Rico Junker, RN as Registered Nurse  CHIEF COMPLAINT: Clinical stage Ib ER PR positive, HER-2 negative invasive lower outer quadrant the left breast.  INTERVAL HISTORY: Patient returns to clinic today as an add-on to further discuss her treatment options.  She currently feels well and is asymptomatic. She has no neurologic complaints.  She denies any recent fevers.  She has a good appetite and denies weight loss.  She has no chest pain or shortness of breath.  She denies any nausea, vomiting, constipation, or diarrhea.  She has no urinary complaints.  Patient offers no specific complaints today.  REVIEW OF SYSTEMS:   Review of Systems  Constitutional: Negative.  Negative for fever, malaise/fatigue and weight loss.  Respiratory: Negative.  Negative for cough and shortness of breath.   Cardiovascular: Negative.  Negative for chest pain and leg swelling.  Gastrointestinal: Negative for abdominal pain.  Genitourinary: Negative.   Musculoskeletal: Negative.   Skin: Negative.   Neurological: Negative.  Negative for weakness.  Psychiatric/Behavioral: The patient is nervous/anxious.     As per HPI. Otherwise, a complete review of systems is negative.  PAST MEDICAL HISTORY: History reviewed. No pertinent past medical history.  PAST SURGICAL HISTORY: Past Surgical History:  Procedure Laterality Date  . BREAST BIOPSY Left 05/27/2017   U/S core Bx- path pending  . BREAST BIOPSY Left 05/27/2017   Affirm Bx- path pending    FAMILY HISTORY: Family History  Problem Relation Age of Onset  . Arthritis Mother   . Cirrhosis Father   . Hypertension Father   . Hypertension Sister   . Alzheimer's disease Maternal Grandmother   . Breast cancer Neg Hx      ADVANCED DIRECTIVES (Y/N):  N  HEALTH MAINTENANCE: Social History   Tobacco Use  . Smoking status: Never Smoker  . Smokeless tobacco: Never Used  Substance Use Topics  . Alcohol use: Yes  . Drug use: No     Colonoscopy:  PAP:  Bone density:  Lipid panel:  No Known Allergies  No current outpatient medications on file.   No current facility-administered medications for this visit.     OBJECTIVE: Vitals:   06/25/17 1207  BP: 121/81  Pulse: 71  Resp: 20  Temp: 98.6 F (37 C)     Body mass index is 24.48 kg/m.    ECOG FS:0 - Asymptomatic  General: Well-developed, well-nourished, no acute distress. Eyes: Pink conjunctiva, anicteric sclera. Breast: Deferred today. Lungs: Clear to auscultation bilaterally. Heart: Regular rate and rhythm. No rubs, murmurs, or gallops. Abdomen: Soft, nontender, nondistended. No organomegaly noted, normoactive bowel sounds. Musculoskeletal: No edema, cyanosis, or clubbing. Neuro: Alert, answering all questions appropriately. Cranial nerves grossly intact. Skin: No rashes or petechiae noted. Psych: Normal affect.   LAB RESULTS:  No results found for: NA, K, CL, CO2, GLUCOSE, BUN, CREATININE, CALCIUM, PROT, ALBUMIN, AST, ALT, ALKPHOS, BILITOT, GFRNONAA, GFRAA  No results found for: WBC, NEUTROABS, HGB, HCT, MCV, PLT   STUDIES: Nm Cardiac Muga Rest  Result Date: 06/11/2017 CLINICAL DATA:  LEFT breast cancer, pre cardiotoxic chemotherapy EXAM: NUCLEAR MEDICINE CARDIAC BLOOD POOL IMAGING (MUGA) TECHNIQUE: Cardiac multi-gated acquisition was performed at rest following intravenous injection of Tc-44mlabeled red blood cells. RADIOPHARMACEUTICALS:  22.513 mCi Tc-924mertechnetate in-vitro labeled red blood cells IV COMPARISON:  None FINDINGS: Calculated LEFT ventricular ejection fraction is 61.2%, which is within the normal range. Study was obtained at a cardiac rate of 76 beats per minute. Patient was rhythmic during imaging. No focal  wall motion abnormalities identified. IMPRESSION: Normal LEFT ventricular ejection fraction of 61.2%. Normal LV wall motion. Electronically Signed   By: Lavonia Dana M.D.   On: 06/11/2017 13:36    ASSESSMENT: Clinical stage Ib ER PR positive, HER-2 negative invasive lower outer quadrant the left breast.  PLAN:    1. 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  Given the extent of DCIS, have ordered a breast MRI for further evaluation although this is not been completed.  After discussion with her surgeon, patient does not wish to undergo neoadjuvant chemotherapy is likely going to proceed with lumpectomy or possible mastectomy with reconstruction.  Chemotherapy options were discussed once again.  Patient's final pathology is staged as a T1c, will send Oncotype to assess if chemotherapy is necessary.  If her final pathology was T2, will recommend adjuvant Adriamycin, Cytoxan, and Taxol.  Patient has already had a MUGA scan on June 11, 2017 revealed an EF of 61%.  Previously, genetic counseling was discussed, but patient wishes to defer this at this time.  If she has a lumpectomy, she also will require adjuvant XRT.  Finally, patient will benefit from tamoxifen for a total of 5-10 years at the completion of all her treatments.  Patient will follow-up 1-2 weeks after her surgery to discuss the final pathology results and treatment planning.  Approximately 30 minutes was spent in discussion of which greater than 50% was consultation.  Patient expressed understanding and was in agreement with this plan. She also understands that She can call clinic at any time with any questions, concerns, or complaints.   Cancer Staging Primary cancer of lower-outer quadrant of left breast [facility]) Staging form: Breast, AJCC 8th Edition - Clinical stage from 05/31/2017: Stage IB (cT2, cN0, cM0, G1, ER: Positive,  PR: Positive, HER2: Negative) - Signed by Lloyd Huger, MD on 05/31/2017   Lloyd Huger, MD   06/29/2017 8:18 AM

## 2017-06-25 ENCOUNTER — Other Ambulatory Visit: Payer: Self-pay

## 2017-06-25 ENCOUNTER — Encounter: Payer: Self-pay | Admitting: Oncology

## 2017-06-25 ENCOUNTER — Inpatient Hospital Stay: Payer: Medicaid Other | Attending: Oncology | Admitting: Oncology

## 2017-06-25 VITALS — BP 121/81 | HR 71 | Temp 98.6°F | Resp 20 | Wt 142.6 lb

## 2017-06-25 DIAGNOSIS — C50512 Malignant neoplasm of lower-outer quadrant of left female breast: Secondary | ICD-10-CM

## 2017-06-25 DIAGNOSIS — Z170 Estrogen receptor positive status [ER+]: Secondary | ICD-10-CM

## 2017-06-25 NOTE — Progress Notes (Signed)
Patient denies any concerns today.  

## 2017-06-27 ENCOUNTER — Telehealth: Payer: Self-pay | Admitting: Genetic Counselor

## 2017-06-27 NOTE — Telephone Encounter (Addendum)
Kelsey Klein called to schedule an appointment: Tuesday 07/08/17 @ 10:30am  -------------------------------------------------------------------------  Another message to schedule was left today, 07/01/17  -------------------------------------------------------------------------  Dr. Grayland Ormond is referring Kelsey Klein for genetic counseling due to a personal history of breast cancer. I left her a message to call and schedule this telegenetics visit to be done by phone at her convenience.   Steele Berg, [place], [place] Genetic Counselor Phone: [phone]

## 2017-07-01 DIAGNOSIS — C50812 Malignant neoplasm of overlapping sites of left female breast: Secondary | ICD-10-CM | POA: Insufficient documentation

## 2017-07-03 ENCOUNTER — Telehealth: Payer: Self-pay | Admitting: *Deleted

## 2017-07-03 NOTE — Progress Notes (Signed)
  Oncology Nurse Navigator Documentation  Navigator Location: [facility] (07/03/17 1000)   )Navigator Encounter Type: Telephone (07/03/17 1000) Telephone: Lahoma Crocker Call;Appt Confirmation/Clarification;Education(Research) (07/03/17 1000)                     Treatment Phase: Pre-Tx/Tx Discussion (07/03/17 1000) Barriers/Navigation Needs: Coordination of Care;Education (07/03/17 1000)   Interventions: Referrals;Coordination of Care (07/03/17 1000) Referrals: Genetics(Research) (07/03/17 1000) Coordination of Care: Appts (07/03/17 1000)                  Time Spent with Patient: 30 (07/03/17 1000)  Phoned patient to follow-up on treatment plan.  Patient states she spoke to Dr. Marla Roe regarding reconstruction.  Patient is phoning Dr. Dwyane Luo office today to coordinate.  She states she still needs to now about chemotherapy, and genetics.  Given number to [employer], [employer].  Explained Oncotype sent by Dr. Grayland Ormond.  Also introduced UPBEAT clinical trial, and patient would like for [name] to contact her to review this study.

## 2017-07-03 NOTE — Telephone Encounter (Signed)
T/C made to Kelsey Klein following a conversation with [place], who has spoken to the patient and found that the patient was interested in learning more about the UPBEAT study. Patient had also called and left a message for me voicing her interest. Kelsey Klein was given an overview of the study and reported she is interested, but is not sure whether she will be receiving chemotherapy for her breast cancer at this time. Patient states that she will call Dr. Bary Castilla this afternoon and schedule her next appointment, and that she plans to have surgery, and then determine with her physician whether she needs chemotherapy. I offered to email patient a copy of the informed consent form to review in the meantime, and she declined stating that she prefers that I touch base with her at her next [place] appointment following surgery.  [name], BSN, MHA, OCN 07/03/2017 2:10 PM

## 2017-07-08 ENCOUNTER — Encounter: Payer: Self-pay | Admitting: Genetic Counselor

## 2017-07-08 ENCOUNTER — Telehealth: Payer: Self-pay | Admitting: Genetic Counselor

## 2017-07-08 DIAGNOSIS — Z803 Family history of malignant neoplasm of breast: Secondary | ICD-10-CM

## 2017-07-08 NOTE — Telephone Encounter (Signed)
error 

## 2017-07-08 NOTE — Telephone Encounter (Signed)
Cancer Genetics            Telegenetics Initial Visit    Patient Name: Kelsey Klein Patient DOB: Nov 12, 1970 Patient Age: 47 y.o. Phone Call Date: 07/08/2017  Referring Provider: Delight Hoh, MD  Reason for Visit: Evaluate for hereditary susceptibility to cancer    Assessment and Plan:  . Kelsey Klein history is not highly suggestive of a hereditary predisposition to cancer, but given her age at diagnosis and new information about 2 paternal aunts with breast cancer, a genetics evaluation is warranted.   . Testing is recommended to determine whether she has a pathogenic mutation that will impact her screening and risk-reduction for cancer. A negative result will be reassuring.  . Kelsey Klein wished to pursue genetic testing and will schedule a lab visit for a blood sample. Analysis will include the 47 genes on [facility]'s Common Cancers panel (APC, ATM, AXIN2, BARD1, BMPR1A, BRCA1, BRCA2, BRIP1, CDH1, CDK4, CDKN2A, CHEK2, CTNNA1, DICER1, EPCAM, GREM1, HOXB13, KIT, MEN1, MLH1, MSH2, MSH3, MSH6, MUTYH, NBN, NF1, NTHL1, PALB2, PDGFRA, PMS2, POLD1, POLE, PTEN, RAD50, RAD51C, RAD51D, SDHA, SDHB, SDHC, SDHD, SMAD4, SMARCA4, STK11, TP53, TSC1, TSC2, VHL).   . Results should be available in approximately 2-3 weeks, at which point we will contact her and address implications for her as well as address genetic testing for at-risk family members, if needed.     Dr. Grayland Ormond was available for questions concerning this case. Total time spent by counseling by phone was approximately 25 minutes.   _____________________________________________________________________   History of Present Illness: Kelsey Klein, a 47 y.o. female, was referred for genetic counseling to discuss the possibility of a hereditary predisposition to cancer and discuss whether genetic testing is warranted. This was a telegenetics visit via phone.  Kelsey Klein was diagnosed with breast cancer  at the age of 63. She stated that she will be pursuing bilateral mastectomies, but that the surgery has not yet been scheduled.   Past Medical History:  Diagnosis Date  . Family history of breast cancer     Past Surgical History:  Procedure Laterality Date  . BREAST BIOPSY Left 05/27/2017   U/S core Bx- path pending  . BREAST BIOPSY Left 05/27/2017   Affirm Bx- path pending    Family History: Significant diagnoses include the following:  Family History  Problem Relation Age of Onset  . Arthritis Mother   . Cirrhosis Father        deceased 72; adopted  . Hypertension Father   . Hypertension Sister   . Alzheimer's disease Maternal Grandmother   . Breast cancer Paternal Aunt        age at dx unknown  . Breast cancer Paternal Aunt        age at dx unknown    Additionally, Kelsey Klein has one daughter (age 78). She has one full sister (age 44) and one maternal half-brother. Her mother (age 72) is cancer-free. Her mother had 2 brothers and 3 sisters. Her father (noted above) was adopted so she has limited family history information. She did find out, however, that he had 2 sisters with breast cancer.  Kelsey Klein ancestry is African American. There is no known Jewish ancestry and no consanguinity.  Discussion: We reviewed the characteristics, features and inheritance patterns of hereditary cancer syndromes. We discussed her risk of harboring a mutation in the context of her personal and family history. We discussed that her  somewhat unknown paternal family makes risk assessment challenging. We discussed the process of genetic testing, insurance coverage and implications of results: positive, negative and variant of unknown significance (VUS).   Kelsey Klein questions were answered to her satisfaction today and she is welcome to call with any additional questions or concerns. Thank you for the referral and allowing [place] to share in the care of your patient.    Steele Berg, MS,  [place] Certified Genetic Counselor phone: [phone]

## 2017-07-09 ENCOUNTER — Ambulatory Visit: Payer: Self-pay | Admitting: General Surgery

## 2017-07-15 ENCOUNTER — Inpatient Hospital Stay: Payer: Medicaid Other

## 2017-07-15 ENCOUNTER — Encounter: Payer: Self-pay | Admitting: *Deleted

## 2017-07-15 ENCOUNTER — Encounter: Payer: Self-pay | Admitting: Oncology

## 2017-07-15 ENCOUNTER — Ambulatory Visit: Payer: Medicaid Other | Admitting: General Surgery

## 2017-07-15 VITALS — BP 124/68 | HR 80 | Resp 12 | Ht 64.0 in | Wt 143.0 lb

## 2017-07-15 DIAGNOSIS — Z170 Estrogen receptor positive status [ER+]: Secondary | ICD-10-CM

## 2017-07-15 DIAGNOSIS — C50512 Malignant neoplasm of lower-outer quadrant of left female breast: Secondary | ICD-10-CM

## 2017-07-15 NOTE — Progress Notes (Signed)
Patient ID: Kelsey Klein, female   DOB: 1971-06-13, 47 y.o.   MRN: [id number]  Chief Complaint  Patient presents with  . Follow-up    HPI Kelsey Klein is a 47 y.o. female.  Here for follow up left breast cancer and discuss surgery. She did consult with Dr Kelsey Klein but she is not a candidate for what she wanted, she originally did not want implants. She has decided on having bilateral mastectomy with implants. She has seen Dr Kelsey Klein but she does not want chemotherapy. She is recovering from a cold and still has a cough. The patient is a hair stylist. She has had the same boyfriend, Kelsey Klein since she was 50 years old.  HPI  Past Medical History:  Diagnosis Date  . Family history of breast cancer     Past Surgical History:  Procedure Laterality Date  . 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 BREAST BIOPSY Left 05/27/2017   Affirm Bx- DCIS    Family History  Problem Relation Age of Onset  . Arthritis Mother   . Cirrhosis Father        deceased 32; adopted  . Hypertension Father   . Hypertension Sister   . Alzheimer's disease Maternal Grandmother   . Breast cancer Paternal Aunt        age at dx unknown  . Breast cancer Paternal Aunt        age at dx unknown    Social History Social History   Tobacco Use  . Smoking status: Never Smoker  . Smokeless tobacco: Never Used  Substance Use Topics  . Alcohol use: Yes  . Drug use: No    No Known Allergies  No current outpatient medications on file.   No current facility-administered medications for this visit.     Review of Systems Review of Systems  Constitutional: Negative.   Respiratory: Positive for cough.   Cardiovascular: Negative.     Blood pressure 124/68, pulse 80, resp. rate 12, height 5' 4"  (1.626 m), weight 143 lb (64.9 kg), last menstrual period 07/01/2017.  Physical Exam Physical Exam  Constitutional: She is oriented to person, place,  and time. She appears well-developed and well-nourished.  HENT:  Mouth/Throat: Oropharynx is clear and moist.  Eyes: Conjunctivae are normal. No scleral icterus.  Neck: Neck supple.  Cardiovascular: Normal rate, regular rhythm and normal heart sounds.  Pulmonary/Chest: Effort normal and breath sounds normal. Right breast exhibits no inverted nipple, no mass, no nipple discharge, no skin change and no tenderness. Left breast exhibits mass. Left breast exhibits no inverted nipple, no nipple discharge, no skin change and no tenderness.  Lymphadenopathy:    She has no cervical adenopathy.    She has no axillary adenopathy.  Neurological: She is alert and oriented to person, place, and time.  Skin: Skin is warm and dry.  Psychiatric: Her behavior is normal.    Data Reviewed Plastic surgery consultation note of July 01, 2017 reviewed.  The patient's body habitus may not be one that is readily amenable for autologous tissue reconstruction.  A follow-up with plastic surgery had been recommended when the patient's mother could attend. Genetic testing was completed July 08, 2017.  Results are pending at this time. Assessment    Left breast cancer.    Plan    We once again discussed recommendations against prophylactic contralateral mastectomy, but if the patient is strongly motivated toward this procedure we can move  ahead.  The tumor in the lower inner quadrant of the left breast extends into the retroareolar area near the base of the nipple.  Preservation of the nipple areolar complex on the side may not be advisable or possible.  Options for management here include frozen section analysis with preservation of the nipple areolar complex if negative recognizing that it might require a return back to the operating room if permanent sections were positive versus sacrificing the left NAC with plans for tattoo reconstruction later.  I see no contraindication to preservation of the right NAC, but  would defer to plastics in this regard.  The fact that she will have several drains after surgery, likely experienced an overnight observation and probably would be out of work for a few weeks (as a hairstylist/cosmetologist) was discussed.    HPI, Physical Exam, Assessment and Plan have been scribed under the direction and in the presence of Kelsey Bellow, MD. Kelsey Fetch, RN  I have completed the exam and reviewed the above documentation for accuracy and completeness.  I agree with the above.  Haematologist has been used and any errors in dictation or transcription are unintentional.  Kelsey Klein, M.D., F.A.C.S.   Kelsey Klein Kelsey Klein 07/16/2017, 11:26 AM

## 2017-07-15 NOTE — Patient Instructions (Signed)
The patient is aware to call back for any questions or new concerns.  

## 2017-07-16 ENCOUNTER — Telehealth: Payer: Self-pay

## 2017-07-16 NOTE — Telephone Encounter (Signed)
Spoke with the patient about scheduling her surgery with Dr Bary Castilla and Dr Marla Roe for 08/18/17. She is amendable to this. She will pre admit by phone. We will call her with an arrival time and location as well as go over her pre surgery instructions once her surgery has been posted.

## 2017-07-16 NOTE — Telephone Encounter (Signed)
Message left for the patient to call back to discuss surgery date.

## 2017-07-17 ENCOUNTER — Encounter: Payer: Self-pay | Admitting: Oncology

## 2017-07-22 ENCOUNTER — Other Ambulatory Visit: Payer: Self-pay | Admitting: General Surgery

## 2017-07-22 ENCOUNTER — Telehealth: Payer: Self-pay

## 2017-07-22 DIAGNOSIS — C50012 Malignant neoplasm of nipple and areola, left female breast: Secondary | ICD-10-CM

## 2017-07-22 DIAGNOSIS — C50912 Malignant neoplasm of unspecified site of left female breast: Secondary | ICD-10-CM

## 2017-07-22 NOTE — Telephone Encounter (Signed)
Spoke with patient and notified her to arrive at the Radiology desk at [facility] on 08/18/17 at 7:45 am the morning of her surgery. Surgery instructions reviewed with patient.

## 2017-07-23 ENCOUNTER — Telehealth: Payer: Self-pay | Admitting: Genetic Counselor

## 2017-07-23 NOTE — Telephone Encounter (Addendum)
                     Cancer Genetics             Telegenetics Results Disclosure   Patient Name: Kelsey Klein Patient DOB: 12/18/1970 Patient Age: 47 y.o. Phone Call Date: 07/23/2017  Referring Provider: Zhou Yu, MD    Kelsey Klein was called today to discuss genetic test results. Please see the Genetics telephone note from 07/01/17 for a detailed discussion of her personal and family histories and the recommendations provided.  Genetic Testing: At the time of Kelsey Klein's telegenetics visit, she decided to pursue genetic testing of multiple genes associated with hereditary susceptibility to cancer. Testing included sequencing and deletion/duplication analysis. 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  A copy of the genetic test report will be scanned into Epic under the Media tab.  The genes analyzed were the 47 genes on [facility]'s Common Cancers panel (APC, ATM, AXIN2, BARD1, BMPR1A, BRCA1, BRCA2, BRIP1, CDH1, CDK4, CDKN2A, CHEK2, CTNNA1, DICER1, EPCAM, GREM1, HOXB13, KIT, MEN1, MLH1, MSH2, MSH3, MSH6, MUTYH, NBN, NF1, NTHL1, PALB2, PDGFRA, PMS2, POLD1, POLE, PTEN, RAD50, RAD51C, RAD51D, SDHA, SDHB, SDHC, SDHD, SMAD4, SMARCA4, STK11, TP53, TSC1, TSC2, VHL).   MUTYH Risks: We discussed 1-2% of individuals of [place] European descent are carriers of a MUTYH mutation. There is conflicting data regarding whether a single MUTYH mutation confers a moderate increased risk (up to 2-fold) for colorectal cancer. If an individual inherits two pathogenic mutations, they have a recessive form of hereditary colonic polyposis. We discussed that this result does not explain Kelsey Klein's breast cancer diagnosis and should not be over interpreted.  Medical Management: We emphasized that being a carrier of a single MUTYH mutation is not thought to significantly increase cancer risk, especially when there is no family history of colorectal cancer. Kelsey Klein  is recommended to follow cancer screenings recommended by her providers. The National Comprehensive Cancer Network Genetic/Familial High Risk Assessment: Colorectal (V1.2018) recommends the following in those with a single MUTYH mutation:  - Beginning at age 40 or 10 years younger than the earliest diagnosis of colorectal cancer in a parent, sibling, or child (whichever is earlier): Colonoscopy every 5 years. If there is no family history of colorectal cancer, data are uncertain if specialized screening is warranted.  - These recommendations may change if an individual has polyps, colorectal cancer, inflammatory bowel disease (IBD), or family history of colorectal cancer.  Family Members: It is important that Kelsey Klein informs her relatives of these results. They are recommended to speak with a genetic counselor prior to any testing. A genetic counselor can be located at www.nsgc.org.  Kelsey Klein's sister and daughter each has a 50% chance to have inherited this mutation. However, this is of no consequence to her daughter at this point. We discussed that one option would be for her daughter's father to pursue testing to see whether he is a carrier of a MUTYH mutation as well. Only then would her daughter be at risk of having MUTYH-associated polyposis.  Any relative who had cancer at a young age or had a particularly rare cancer may also wish to pursue genetic testing. Genetic counselors can be located in other cities, by visiting the website of the National Society of Genetic Counselors (www.nsgc.org) and searching for a genetic counselor by zip code.    Lastly, cancer genetics is a rapidly advancing field and it is possible that   new genetic tests will be appropriate for Kelsey Klein in the future. We encourage Kelsey Klein to remain in contact with Genetics on an annual basis so her personal and family histories can be updated.    Kelsey Leitner, MS, CGC Certified Genetic Counselor phone:  [phone]  

## 2017-07-30 ENCOUNTER — Encounter: Payer: Self-pay | Admitting: Oncology

## 2017-08-11 ENCOUNTER — Encounter
Admission: RE | Admit: 2017-08-11 | Discharge: 2017-08-11 | Disposition: A | Discharge status: Home / Self Care | Payer: Medicaid Other | Source: Ambulatory Visit | Attending: General Surgery | Admitting: General Surgery

## 2017-08-11 ENCOUNTER — Other Ambulatory Visit: Payer: Self-pay

## 2017-08-11 NOTE — Patient Instructions (Signed)
Your procedure is scheduled on: 08-18-17 Report to [facility] ON RIGHT  Remember: Instructions that are not followed completely may result in serious medical risk, up to and including death, or upon the discretion of your surgeon and anesthesiologist your surgery may need to be rescheduled.    _x___ 1. Do not eat food after midnight the night before your procedure. NO GUM OR CANDY AFTER MIDNIGHT.  You may drink clear liquids up to 2 hours before you are scheduled to arrive at the hospital for your procedure.  Do not drink clear liquids within 2 hours of your scheduled arrival to the hospital.  Clear liquids include  --Water or Apple juice without pulp  --Clear carbohydrate beverage such as ClearFast or Gatorade  --Black Coffee or Clear Tea (No milk, no creamers, do not add anything to the coffee or Tea    __x__ 2. No Alcohol for 24 hours before or after surgery.   __x__3. No Smoking or e-cigarettes for 24 prior to surgery.  Do not use any chewable tobacco products for at least 6 hour prior to surgery   ____  4. Bring all medications with you on the day of surgery if instructed.    __x__ 5. Notify your doctor if there is any change in your medical condition     (cold, fever, infections).    x___6. On the morning of surgery brush your teeth with toothpaste and water.  You may rinse your mouth with mouth wash if you wish.  Do not swallow any toothpaste or mouthwash.   Do not wear jewelry, make-up, hairpins, clips or nail polish.  Do not wear lotions, powders, or perfumes. You may wear deodorant.  Do not shave 48 hours prior to surgery. Men may shave face and neck.  Do not bring valuables to the hospital.    [facility] is not responsible for any belongings or valuables.               Contacts, dentures or bridgework may not be worn into surgery.  Leave your suitcase in the car. After surgery it may be brought to your room.  For patients admitted to the  hospital, discharge time is determined by your  treatment team.  _  Patients discharged the day of surgery will not be allowed to drive home.  You will need someone to drive you home and stay with you the night of your procedure.    Please read over the following fact sheets that you were given:   [facility] Preparing for Surgery and or MRSA Information   ____ Take anti-hypertensive listed below, cardiac, seizure, asthma,     anti-reflux and psychiatric medicines. These include:  1. NONE  2.  3.  4.  5.  6.  ____Fleets enema or Magnesium Citrate as directed.   ____ Use CHG Soap or sage wipes as directed on instruction sheet   ____ Use inhalers on the day of surgery and bring to hospital day of surgery  ____ Stop Metformin and Janumet 2 days prior to surgery.    ____ Take 1/2 of usual insulin dose the night before surgery and none on the morning surgery.   ____ Follow recommendations from Cardiologist, Pulmonologist or PCP regarding stopping Aspirin, Coumadin, Plavix ,Eliquis, Effient, or Pradaxa, and Pletal.  X____Stop Anti-inflammatories such as Advil, Aleve, Ibuprofen, Motrin, Naproxen, Naprosyn, Goodies powders or aspirin products NOW-OK to take Tylenol    ____ Stop supplements until after surgery.  ____ Bring C-Pap to the hospital.

## 2017-08-14 ENCOUNTER — Ambulatory Visit: Payer: Self-pay | Admitting: Plastic Surgery

## 2017-08-14 DIAGNOSIS — C50919 Malignant neoplasm of unspecified site of unspecified female breast: Secondary | ICD-10-CM

## 2017-08-17 MED ORDER — CEFAZOLIN SODIUM-DEXTROSE 2-4 GM/100ML-% IV SOLN
2.0000 g | INTRAVENOUS | Status: AC
  Administered 2017-08-18: 2 g via INTRAVENOUS

## 2017-08-18 ENCOUNTER — Ambulatory Visit: Payer: Medicaid Other | Admitting: Anesthesiology

## 2017-08-18 ENCOUNTER — Other Ambulatory Visit: Payer: Self-pay

## 2017-08-18 ENCOUNTER — Encounter: Payer: Self-pay | Admitting: *Deleted

## 2017-08-18 ENCOUNTER — Ambulatory Visit
Admission: RE | Admit: 2017-08-18 | Discharge: 2017-08-18 | Disposition: A | Discharge status: Home / Self Care | Payer: Medicaid Other | Source: Ambulatory Visit | Attending: General Surgery | Admitting: General Surgery

## 2017-08-18 ENCOUNTER — Observation Stay
Admission: RE | Admit: 2017-08-18 | Discharge: 2017-08-19 | Disposition: A | Discharge status: Home / Self Care | Payer: Medicaid Other | Source: Ambulatory Visit | Attending: General Surgery | Admitting: General Surgery

## 2017-08-18 ENCOUNTER — Encounter
Admission: RE | Disposition: A | Discharge status: Home / Self Care | Payer: Self-pay | Source: Ambulatory Visit | Attending: General Surgery

## 2017-08-18 DIAGNOSIS — Z803 Family history of malignant neoplasm of breast: Secondary | ICD-10-CM | POA: Insufficient documentation

## 2017-08-18 DIAGNOSIS — C50512 Malignant neoplasm of lower-outer quadrant of left female breast: Secondary | ICD-10-CM

## 2017-08-18 DIAGNOSIS — C50912 Malignant neoplasm of unspecified site of left female breast: Secondary | ICD-10-CM

## 2017-08-18 DIAGNOSIS — Z170 Estrogen receptor positive status [ER+]: Principal | ICD-10-CM

## 2017-08-18 DIAGNOSIS — N6091 Unspecified benign mammary dysplasia of right breast: Secondary | ICD-10-CM | POA: Diagnosis not present

## 2017-08-18 DIAGNOSIS — C50919 Malignant neoplasm of unspecified site of unspecified female breast: Secondary | ICD-10-CM | POA: Diagnosis present

## 2017-08-18 HISTORY — PX: BREAST RECONSTRUCTION WITH PLACEMENT OF TISSUE EXPANDER AND FLEX HD (ACELLULAR HYDRATED DERMIS): SHX6295

## 2017-08-18 HISTORY — PX: MASTECTOMY W/ SENTINEL NODE BIOPSY: SHX2001

## 2017-08-18 LAB — POCT PREGNANCY, URINE: PREG TEST UR: NEGATIVE

## 2017-08-18 SURGERY — MASTECTOMY WITH SENTINEL LYMPH NODE BIOPSY
Anesthesia: General | Laterality: Bilateral | Wound class: Clean Contaminated

## 2017-08-18 MED ORDER — LIDOCAINE HCL (PF) 2 % IJ SOLN
INTRAMUSCULAR | Status: AC
  Filled 2017-08-18: qty 10

## 2017-08-18 MED ORDER — ONDANSETRON HCL 4 MG/2ML IJ SOLN
INTRAMUSCULAR | Status: AC
  Filled 2017-08-18: qty 2

## 2017-08-18 MED ORDER — FENTANYL CITRATE (PF) 100 MCG/2ML IJ SOLN
INTRAMUSCULAR | Status: AC
  Administered 2017-08-18: 25 ug via INTRAVENOUS
  Filled 2017-08-18: qty 2

## 2017-08-18 MED ORDER — PROMETHAZINE HCL 25 MG/ML IJ SOLN: 12.5000 mg | INTRAMUSCULAR | Status: DC | PRN

## 2017-08-18 MED ORDER — FENTANYL CITRATE (PF) 100 MCG/2ML IJ SOLN
INTRAMUSCULAR | Status: AC
Start: 2017-08-18 — End: 2017-08-18
  Administered 2017-08-18: 25 ug via INTRAVENOUS
  Filled 2017-08-18: qty 2

## 2017-08-18 MED ORDER — MIDAZOLAM HCL 2 MG/2ML IJ SOLN
INTRAMUSCULAR | Status: DC | PRN
  Administered 2017-08-18: 2 mg via INTRAVENOUS

## 2017-08-18 MED ORDER — METHOCARBAMOL 750 MG PO TABS
750.0000 mg | ORAL_TABLET | Freq: Three times a day (TID) | ORAL | Status: DC
  Administered 2017-08-18 – 2017-08-19 (×2): 750 mg via ORAL
  Filled 2017-08-18 (×4): qty 1

## 2017-08-18 MED ORDER — MEPERIDINE HCL 50 MG/ML IJ SOLN: 6.2500 mg | INTRAMUSCULAR | Status: DC | PRN

## 2017-08-18 MED ORDER — CEFAZOLIN SODIUM-DEXTROSE 1-4 GM/50ML-% IV SOLN
1.0000 g | Freq: Three times a day (TID) | INTRAVENOUS | Status: DC
  Administered 2017-08-18 – 2017-08-19 (×2): 1 g via INTRAVENOUS
  Filled 2017-08-18 (×4): qty 50

## 2017-08-18 MED ORDER — PROMETHAZINE HCL 25 MG/ML IJ SOLN: 6.2500 mg | INTRAMUSCULAR | Status: DC | PRN

## 2017-08-18 MED ORDER — DEXAMETHASONE SODIUM PHOSPHATE 10 MG/ML IJ SOLN
INTRAMUSCULAR | Status: DC | PRN
  Administered 2017-08-18: 5 mg via INTRAVENOUS

## 2017-08-18 MED ORDER — FENTANYL CITRATE (PF) 100 MCG/2ML IJ SOLN
25.0000 ug | INTRAMUSCULAR | Status: AC | PRN
  Administered 2017-08-18 (×6): 25 ug via INTRAVENOUS

## 2017-08-18 MED ORDER — FAMOTIDINE 20 MG PO TABS
20.0000 mg | ORAL_TABLET | Freq: Once | ORAL | Status: AC
  Administered 2017-08-18: 20 mg via ORAL

## 2017-08-18 MED ORDER — ONDANSETRON HCL 4 MG/2ML IJ SOLN
INTRAMUSCULAR | Status: DC | PRN
  Administered 2017-08-18: 4 mg via INTRAVENOUS

## 2017-08-18 MED ORDER — LACTATED RINGERS IV SOLN
INTRAVENOUS | Status: DC
  Administered 2017-08-18 (×2): via INTRAVENOUS

## 2017-08-18 MED ORDER — DEXAMETHASONE SODIUM PHOSPHATE 10 MG/ML IJ SOLN
INTRAMUSCULAR | Status: AC
  Filled 2017-08-18: qty 1

## 2017-08-18 MED ORDER — METHYLENE BLUE 1 % INJ SOLN
INTRAMUSCULAR | Status: AC
  Filled 2017-08-18: qty 10

## 2017-08-18 MED ORDER — OXYCODONE HCL 5 MG PO TABS
5.0000 mg | ORAL_TABLET | ORAL | Status: DC | PRN
  Administered 2017-08-18 – 2017-08-19 (×2): 5 mg via ORAL
  Filled 2017-08-18 (×2): qty 1

## 2017-08-18 MED ORDER — NEOMYCIN-POLYMYXIN B GU 40-200000 IR SOLN
Status: DC | PRN
  Administered 2017-08-18: 2 mL

## 2017-08-18 MED ORDER — TECHNETIUM TC 99M SULFUR COLLOID FILTERED
1.1500 | Freq: Once | INTRAVENOUS | Status: AC | PRN
  Administered 2017-08-18: 1.15 via INTRADERMAL

## 2017-08-18 MED ORDER — METHYLENE BLUE 0.5 % INJ SOLN
INTRAVENOUS | Status: DC | PRN
  Administered 2017-08-18: 5 mL via SUBMUCOSAL

## 2017-08-18 MED ORDER — HYDROMORPHONE HCL 1 MG/ML IJ SOLN: 0.2500 mg | INTRAMUSCULAR | Status: DC | PRN

## 2017-08-18 MED ORDER — ROCURONIUM BROMIDE 50 MG/5ML IV SOLN
INTRAVENOUS | Status: AC
  Filled 2017-08-18: qty 2

## 2017-08-18 MED ORDER — PROPOFOL 10 MG/ML IV BOLUS
INTRAVENOUS | Status: DC | PRN
  Administered 2017-08-18: 120 mg via INTRAVENOUS

## 2017-08-18 MED ORDER — ACETAMINOPHEN 10 MG/ML IV SOLN
1000.0000 mg | Freq: Four times a day (QID) | INTRAVENOUS | Status: AC
  Administered 2017-08-18 – 2017-08-19 (×4): 1000 mg via INTRAVENOUS
  Filled 2017-08-18 (×4): qty 100

## 2017-08-18 MED ORDER — OXYCODONE HCL 5 MG PO TABS: 5.0000 mg | ORAL_TABLET | Freq: Once | ORAL | Status: DC | PRN

## 2017-08-18 MED ORDER — BUPIVACAINE-EPINEPHRINE (PF) 0.25% -1:200000 IJ SOLN
INTRAMUSCULAR | Status: AC
  Filled 2017-08-18: qty 30

## 2017-08-18 MED ORDER — ACETAMINOPHEN 10 MG/ML IV SOLN
INTRAVENOUS | Status: DC | PRN
  Administered 2017-08-18: 1000 mg via INTRAVENOUS

## 2017-08-18 MED ORDER — DIAZEPAM 5 MG PO TABS: 5.0000 mg | ORAL_TABLET | Freq: Four times a day (QID) | ORAL | Status: DC | PRN

## 2017-08-18 MED ORDER — ROCURONIUM BROMIDE 100 MG/10ML IV SOLN
INTRAVENOUS | Status: DC | PRN
  Administered 2017-08-18: 50 mg via INTRAVENOUS
  Administered 2017-08-18: 20 mg via INTRAVENOUS

## 2017-08-18 MED ORDER — SUGAMMADEX SODIUM 200 MG/2ML IV SOLN
INTRAVENOUS | Status: AC
  Filled 2017-08-18: qty 2

## 2017-08-18 MED ORDER — FENTANYL CITRATE (PF) 250 MCG/5ML IJ SOLN
INTRAMUSCULAR | Status: AC
  Filled 2017-08-18: qty 5

## 2017-08-18 MED ORDER — SUGAMMADEX SODIUM 200 MG/2ML IV SOLN
INTRAVENOUS | Status: DC | PRN
  Administered 2017-08-18: 150 mg via INTRAVENOUS

## 2017-08-18 MED ORDER — FENTANYL CITRATE (PF) 100 MCG/2ML IJ SOLN
INTRAMUSCULAR | Status: DC | PRN
  Administered 2017-08-18 (×4): 50 ug via INTRAVENOUS

## 2017-08-18 MED ORDER — MIDAZOLAM HCL 2 MG/2ML IJ SOLN
INTRAMUSCULAR | Status: AC
  Filled 2017-08-18: qty 2

## 2017-08-18 MED ORDER — GABAPENTIN 300 MG PO CAPS
300.0000 mg | ORAL_CAPSULE | ORAL | Status: AC
  Administered 2017-08-18: 300 mg via ORAL

## 2017-08-18 MED ORDER — MORPHINE SULFATE (PF) 2 MG/ML IV SOLN
2.0000 mg | INTRAVENOUS | Status: DC | PRN
  Administered 2017-08-18 – 2017-08-19 (×3): 2 mg via INTRAVENOUS
  Filled 2017-08-18 (×3): qty 1

## 2017-08-18 MED ORDER — LIDOCAINE HCL (CARDIAC) 20 MG/ML IV SOLN
INTRAVENOUS | Status: DC | PRN
  Administered 2017-08-18: 80 mg via INTRAVENOUS

## 2017-08-18 MED ORDER — NEOMYCIN-POLYMYXIN B GU 40-200000 IR SOLN
Status: AC
  Filled 2017-08-18: qty 4

## 2017-08-18 MED ORDER — KETOROLAC TROMETHAMINE 30 MG/ML IJ SOLN
INTRAMUSCULAR | Status: AC
  Filled 2017-08-18: qty 1

## 2017-08-18 MED ORDER — CEFAZOLIN SODIUM-DEXTROSE 1-4 GM/50ML-% IV SOLN
INTRAVENOUS | Status: DC | PRN
  Administered 2017-08-18: 1 g via INTRAVENOUS

## 2017-08-18 MED ORDER — PHENYLEPHRINE HCL 10 MG/ML IJ SOLN
INTRAMUSCULAR | Status: DC | PRN
  Administered 2017-08-18: 50 ug via INTRAVENOUS

## 2017-08-18 MED ORDER — FAMOTIDINE 20 MG PO TABS
ORAL_TABLET | ORAL | Status: AC
  Filled 2017-08-18: qty 1

## 2017-08-18 MED ORDER — OXYCODONE HCL 5 MG/5ML PO SOLN: 5.0000 mg | Freq: Once | ORAL | Status: DC | PRN

## 2017-08-18 MED ORDER — GABAPENTIN 300 MG PO CAPS
ORAL_CAPSULE | ORAL | Status: AC
  Filled 2017-08-18: qty 1

## 2017-08-18 MED ORDER — DEXTROSE IN LACTATED RINGERS 5 % IV SOLN
INTRAVENOUS | Status: DC
  Administered 2017-08-18: 17:00:00 via INTRAVENOUS

## 2017-08-18 MED ORDER — CEFAZOLIN SODIUM 1 G IJ SOLR
INTRAMUSCULAR | Status: AC
  Filled 2017-08-18: qty 10

## 2017-08-18 MED ORDER — ACETAMINOPHEN 10 MG/ML IV SOLN
INTRAVENOUS | Status: AC
  Filled 2017-08-18: qty 100

## 2017-08-18 SURGICAL SUPPLY — 90 items
APPLIER CLIP 11 MED OPEN (CLIP)
APPLIER CLIP 13 LRG OPEN (CLIP)
BAG DECANTER FOR FLEXI CONT (MISCELLANEOUS) ×3 IMPLANT
BANDAGE ELASTIC 6 LF NS (GAUZE/BANDAGES/DRESSINGS) IMPLANT
BINDER BREAST LRG (GAUZE/BANDAGES/DRESSINGS) ×3 IMPLANT
BINDER BREAST MEDIUM (GAUZE/BANDAGES/DRESSINGS) IMPLANT
BINDER BREAST XLRG (GAUZE/BANDAGES/DRESSINGS) IMPLANT
BINDER BREAST XXLRG (GAUZE/BANDAGES/DRESSINGS) IMPLANT
BLADE BOVIE TIP EXT 4 (BLADE) ×3 IMPLANT
BLADE PHOTON ILLUMINATED (MISCELLANEOUS) ×3 IMPLANT
BLADE SURG 15 STRL LF DISP TIS (BLADE) ×1 IMPLANT
BLADE SURG 15 STRL SS (BLADE) ×2
BLADE SURG 15 STRL SS SAFETY (BLADE) ×3 IMPLANT
BNDG GAUZE 4.5X4.1 6PLY STRL (MISCELLANEOUS) IMPLANT
BULB RESERV EVAC DRAIN JP 100C (MISCELLANEOUS) ×6 IMPLANT
CANISTER SUCT 1200ML W/VALVE (MISCELLANEOUS) ×6 IMPLANT
CHLORAPREP W/TINT 26ML (MISCELLANEOUS) ×9 IMPLANT
CLIP APPLIE 11 MED OPEN (CLIP) IMPLANT
CLIP APPLIE 13 LRG OPEN (CLIP) IMPLANT
CLOSURE WOUND 1/2 X4 (GAUZE/BANDAGES/DRESSINGS)
CNTNR SPEC 2.5X3XGRAD LEK (MISCELLANEOUS)
CONT SPEC 4OZ STER OR WHT (MISCELLANEOUS)
CONTAINER SPEC 2.5X3XGRAD LEK (MISCELLANEOUS) IMPLANT
DECANTER SPIKE VIAL GLASS SM (MISCELLANEOUS) IMPLANT
DERMABOND ADVANCED (GAUZE/BANDAGES/DRESSINGS) ×6
DERMABOND ADVANCED .7 DNX12 (GAUZE/BANDAGES/DRESSINGS) ×3 IMPLANT
DEVICE DUBIN SPECIMEN MAMMOGRA (MISCELLANEOUS) IMPLANT
DRAIN CHANNEL 19F RND (DRAIN) ×6 IMPLANT
DRAIN CHANNEL JP 15F RND 16 (MISCELLANEOUS) IMPLANT
DRAPE LAPAROTOMY TRNSV 106X77 (MISCELLANEOUS) ×3 IMPLANT
DRSG TELFA 3X8 NADH (GAUZE/BANDAGES/DRESSINGS) IMPLANT
ELECT CAUTERY BLADE 6.4 (BLADE) IMPLANT
ELECT CAUTERY BLADE TIP 2.5 (TIP) ×6
ELECT REM PT RETURN 9FT ADLT (ELECTROSURGICAL) ×3
ELECTRODE CAUTERY BLDE TIP 2.5 (TIP) ×2 IMPLANT
ELECTRODE REM PT RTRN 9FT ADLT (ELECTROSURGICAL) ×1 IMPLANT
GAUZE FLUFF 18X24 1PLY STRL (GAUZE/BANDAGES/DRESSINGS) IMPLANT
GAUZE SPONGE 4X4 12PLY STRL (GAUZE/BANDAGES/DRESSINGS) ×3 IMPLANT
GLOVE BIO SURGEON STRL SZ 6.5 (GLOVE) ×4 IMPLANT
GLOVE BIO SURGEON STRL SZ7.5 (GLOVE) ×3 IMPLANT
GLOVE BIO SURGEONS STRL SZ 6.5 (GLOVE) ×2
GLOVE INDICATOR 8.0 STRL GRN (GLOVE) ×3 IMPLANT
GOWN STRL REUS W/ TWL LRG LVL3 (GOWN DISPOSABLE) ×4 IMPLANT
GOWN STRL REUS W/TWL LRG LVL3 (GOWN DISPOSABLE) ×8
GRAFT FLEX HD 4X16 THICK (Tissue Mesh) ×6 IMPLANT
IMPL EXPANDER BREAST 375CC (Breast) ×2 IMPLANT
IMPLANT BREAST 375CC (Breast) ×4 IMPLANT
IMPLANT EXPANDER BREAST 375CC (Breast) ×2 IMPLANT
IV NS 1000ML (IV SOLUTION) ×2
IV NS 1000ML BAXH (IV SOLUTION) ×1 IMPLANT
IV NS 500ML (IV SOLUTION) ×2
IV NS 500ML BAXH (IV SOLUTION) ×1 IMPLANT
LABEL OR SOLS (LABEL) ×3 IMPLANT
NEEDLE 21 GA WING INFUSION (NEEDLE) IMPLANT
NEEDLE HYPO 25X1 1.5 SAFETY (NEEDLE) ×3 IMPLANT
PACK BASIN MAJOR ARMC (MISCELLANEOUS) ×3 IMPLANT
PACK BASIN MINOR ARMC (MISCELLANEOUS) ×3 IMPLANT
PACK UNIVERSAL (MISCELLANEOUS) ×3 IMPLANT
PAD ABD DERMACEA PRESS 5X9 (GAUZE/BANDAGES/DRESSINGS) ×12 IMPLANT
PAD PREP 24X41 OB/GYN DISP (PERSONAL CARE ITEMS) IMPLANT
PIN SAFETY STRL (MISCELLANEOUS) ×6 IMPLANT
SET ASEPTIC TRANSFER (MISCELLANEOUS) ×3 IMPLANT
SHEARS FOC LG CVD HARMONIC 17C (MISCELLANEOUS) IMPLANT
SLEVE PROBE SENORX GAMMA FIND (MISCELLANEOUS) ×3 IMPLANT
SPONGE LAP 18X18 5 PK (GAUZE/BANDAGES/DRESSINGS) ×3 IMPLANT
STRIP CLOSURE SKIN 1/2X4 (GAUZE/BANDAGES/DRESSINGS) IMPLANT
SUT ETHILON 3-0 FS-10 30 BLK (SUTURE) ×3
SUT MNCRL 3-0 UNDYED SH (SUTURE) ×2 IMPLANT
SUT MNCRL AB 4-0 PS2 18 (SUTURE) ×6 IMPLANT
SUT MNCRL+ 5-0 UNDYED PC-3 (SUTURE) ×2 IMPLANT
SUT MONOCRYL 3-0 UNDYED (SUTURE) ×4
SUT MONOCRYL 5-0 (SUTURE) ×4
SUT PDS AB 1 CT1 27 (SUTURE) IMPLANT
SUT PDS AB 2-0 CT1 27 (SUTURE) ×21 IMPLANT
SUT SILK 0 (SUTURE) ×2
SUT SILK 0 30XBRD TIE 6 (SUTURE) ×1 IMPLANT
SUT SILK 3 0 (SUTURE) ×2
SUT SILK 3 0 SH 30 (SUTURE) ×6 IMPLANT
SUT SILK 3-0 18XBRD TIE 12 (SUTURE) ×1 IMPLANT
SUT VIC AB 2-0 CT1 27 (SUTURE) ×8
SUT VIC AB 2-0 CT1 TAPERPNT 27 (SUTURE) ×4 IMPLANT
SUT VIC AB 3-0 SH 27 (SUTURE) ×2
SUT VIC AB 3-0 SH 27X BRD (SUTURE) ×1 IMPLANT
SUT VICRYL+ 3-0 144IN (SUTURE) ×3 IMPLANT
SUTURE EHLN 3-0 FS-10 30 BLK (SUTURE) ×1 IMPLANT
SWABSTK COMLB BENZOIN TINCTURE (MISCELLANEOUS) IMPLANT
SYR BULB IRRIG 60ML STRL (SYRINGE) IMPLANT
SYR CONTROL 10ML (SYRINGE) ×3 IMPLANT
TAPE TRANSPORE STRL 2 31045 (GAUZE/BANDAGES/DRESSINGS) ×3 IMPLANT
TOWEL OR 17X26 4PK STRL BLUE (TOWEL DISPOSABLE) ×3 IMPLANT

## 2017-08-18 NOTE — Transfer of Care (Signed)
Immediate Anesthesia Transfer of Care Note  Patient: VAIDEHI BRADDY  Procedure(s) Performed: MASTECTOMY WITH SENTINEL LYMPH NODE BIOPSY,FROZEN SECTION (Bilateral ) BREAST RECONSTRUCTION WITH PLACEMENT OF TISSUE EXPANDER AND FLEX HD (ACELLULAR HYDRATED DERMIS) (Bilateral )  Patient Location: PACU  Anesthesia Type:General  Level of Consciousness: awake, sedated and patient cooperative  Airway & Oxygen Therapy: Patient Spontanous Breathing  Post-op Assessment: Report given to RN, Post -op Vital signs reviewed and stable and Patient moving all extremities  Post vital signs: Reviewed and stable  Last Vitals:  Vitals:   08/18/17 0722 08/18/17 1425  BP: 120/80 115/76  Pulse: 69 93  Resp: 12 20  Temp: (!) 36.4 C (!) 36.3 C  SpO2: 100% 100%    Last Pain:  Vitals:   08/18/17 0722  TempSrc: Tympanic  PainSc: 6          Complications: No apparent anesthesia complications

## 2017-08-18 NOTE — Progress Notes (Signed)
Pt states that her pain level is a seven out or ten  Visual  Sleeping with no grimacing or painful expression

## 2017-08-18 NOTE — H&P (Signed)
ODYSSEY VASBINDER [id number] 03-30-1971     HPI: 47 y/o with left breast cancer.  She desires bilateral mastectomy. For immediate implant based reconstruction.  Feeling well since last visit.   Medications Prior to Admission  Medication Sig Dispense Refill Last Dose  . acetaminophen (TYLENOL) 500 MG tablet Take 500-1,000 mg by mouth daily as needed for moderate pain or headache.   Past Week at Unknown time   No Known Allergies Past Medical History:  Diagnosis Date  . Arthritis    BOTH KNEES  . Cancer ([place])   . Family history of breast cancer    Past Surgical History:  Procedure Laterality Date  . 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 BREAST BIOPSY Left 05/27/2017   Affirm Bx- DCIS   Social History   Socioeconomic History  . Marital status: Single    Spouse name: Not on file  . Number of children: Not on file  . Years of education: Not on file  . Highest education level: Not on file  Social Needs  . Financial resource strain: Not on file  . Food insecurity - worry: Not on file  . Food insecurity - inability: Not on file  . Transportation needs - medical: Not on file  . Transportation needs - non-medical: Not on file  Occupational History  . Not on file  Tobacco Use  . Smoking status: Never Smoker  . Smokeless tobacco: Never Used  Substance and Sexual Activity  . Alcohol use: Yes    Comment: OCC  . Drug use: No  . Sexual activity: Yes  Other Topics Concern  . Not on file  Social History Narrative  . Not on file   Social History   Social History Narrative  . Not on file     ROS: Negative.     PE: HEENT: Negative. Lungs: Clear. Cardio: RR. Assessment/Plan:  Proceed with planned endoscopy.  Forest Gleason [facility] 08/18/2017

## 2017-08-18 NOTE — Anesthesia Procedure Notes (Signed)
Procedure Name: Intubation Date/Time: 08/18/2017 11:01 AM Performed by: Lowry Bowl, CRNA Pre-anesthesia Checklist: Patient identified, Emergency Drugs available, Suction available and Patient being monitored Patient Re-evaluated:Patient Re-evaluated prior to induction Oxygen Delivery Method: Circle system utilized Preoxygenation: Pre-oxygenation with 100% oxygen Induction Type: IV induction Ventilation: Mask ventilation without difficulty Laryngoscope Size: Mac and 3 Grade View: Grade II Tube type: Oral Tube size: 7.0 mm Number of attempts: 1 Airway Equipment and Method: Stylet Placement Confirmation: ETT inserted through vocal cords under direct vision,  positive ETCO2 and breath sounds checked- equal and bilateral Secured at: 21 cm Tube secured with: Tape Dental Injury: Teeth and Oropharynx as per pre-operative assessment

## 2017-08-18 NOTE — H&P (Signed)
Kelsey Klein is an 47 y.o. female.   Chief Complaint: Breast cancer HPI:  The patient is a 47 yrs old bf here for treatment and reconstruction for breast cancer.  She was diagnosed with invasive lower outer quadrant of the LEFT breast.  She is clinical stage ib, ER / PR positive and HER-2 negative.  She is 5 feet 4 inches tall and weight is 142 pounds.  Her preop bra is 34 B.  After a long discussion she decided on an implant based reconstruction.  She is planning on bilateral mastectomies.   Past Medical History:  Diagnosis Date  . Arthritis    BOTH KNEES  . Cancer ([place])   . Family history of breast cancer     Past Surgical History:  Procedure Laterality Date  . 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 BREAST BIOPSY Left 05/27/2017   Affirm Bx- DCIS    Family History  Problem Relation Age of Onset  . Arthritis Mother   . Cirrhosis Father        deceased 48; adopted  . Hypertension Father   . Hypertension Sister   . Alzheimer's disease Maternal Grandmother   . Breast cancer Paternal Aunt        age at dx unknown  . Breast cancer Paternal Aunt        age at dx unknown   Social History:  reports that  has never smoked. she has never used smokeless tobacco. She reports that she drinks alcohol. She reports that she does not use drugs.  Allergies: No Known Allergies  Medications Prior to Admission  Medication Sig Dispense Refill  . acetaminophen (TYLENOL) 500 MG tablet Take 500-1,000 mg by mouth daily as needed for moderate pain or headache.      Results for orders placed or performed during the hospital encounter of 08/18/17 (from the past 48 hour(s))  Pregnancy, urine POC     Status: None   Collection Time: 08/18/17  6:59 AM  Result Value Ref Range   Preg Test, Ur NEGATIVE NEGATIVE    Comment:        THE SENSITIVITY OF THIS METHODOLOGY IS >24 mIU/mL    Nm Sentinel Node Injection  Result Date:  08/18/2017 CLINICAL DATA:  Left breast cancer. EXAM: NUCLEAR MEDICINE BREAST LYMPHOSCINTIGRAPHY TECHNIQUE: Intradermal injection of radiopharmaceutical was performed at the 12 o'clock, 3 o'clock, 6 o'clock, and 9 o'clock positions around the left nipple. The patient was then sent to the operating room where the sentinel node(s) were identified and removed by the surgeon. RADIOPHARMACEUTICALS:  Total of 1 mCi Millipore-filtered Technetium-25msulfur colloid, injected in four aliquots of 0.25 mCi each. IMPRESSION: Uncomplicated intradermal injection of a total of 1 mCi Technetium-959mulfur colloid for purposes of sentinel node identification. Electronically Signed   By: GlAletta Edouard.D.   On: 08/18/2017 09:05    Review of Systems  Constitutional: Negative.   HENT: Negative.   Eyes: Negative.   Respiratory: Negative.   Cardiovascular: Negative.   Gastrointestinal: Negative.   Genitourinary: Negative.   Musculoskeletal: Negative.   Skin: Negative.   Psychiatric/Behavioral: Negative.     Blood pressure 120/80, pulse 69, temperature (!) 97.5 F (36.4 C), temperature source Tympanic, resp. rate 12, height _0  (1.626 m), weight 64.4 kg (142 lb), last menstrual period 07/28/2017, SpO2 100 %. Physical Exam  Constitutional: She is oriented to person, place, and time. She appears well-developed and well-nourished.  HENT:  Head: Normocephalic and atraumatic.  Eyes: Conjunctivae and EOM are normal. Pupils are equal, round, and reactive to light.  Cardiovascular: Normal rate.  Respiratory: Effort normal.  Neurological: She is alert and oriented to person, place, and time.  Skin: Skin is warm.  Psychiatric: She has a normal mood and affect. Her behavior is normal. Judgment and thought content normal.     Assessment/Plan Plan for bilateral breast reconstruction with expander and FlexHD placement.    [place], DO 08/18/2017, 11:12 AM

## 2017-08-18 NOTE — Anesthesia Preprocedure Evaluation (Addendum)
Anesthesia Evaluation  Patient identified by MRN, date of birth, ID band Patient awake    Reviewed: Allergy & Precautions, NPO status , Patient's Chart, lab work & pertinent test results  History of Anesthesia Complications Negative for: history of anesthetic complications  Airway Mallampati: II  TM Distance: >3 FB Neck ROM: Full    Dental no notable dental hx.    Pulmonary neg pulmonary ROS, neg sleep apnea, neg COPD,    breath sounds clear to auscultation- rhonchi (-) wheezing      Cardiovascular Exercise Tolerance: Good (-) hypertension(-) CAD, (-) Past MI, (-) Cardiac Stents and (-) CABG  Rhythm:Regular Rate:Normal - Systolic murmurs and - Diastolic murmurs    Neuro/Psych negative neurological ROS  negative psych ROS   GI/Hepatic negative GI ROS, Neg liver ROS,   Endo/Other  negative endocrine ROSneg diabetes  Renal/GU negative Renal ROS     Musculoskeletal  (+) Arthritis ,   Abdominal (+) - obese,   Peds  Hematology negative hematology ROS (+)   Anesthesia Other Findings Past Medical History: No date: Arthritis     Comment:  BOTH KNEES No date: Cancer ([place]) No date: Family history of breast cancer   Reproductive/Obstetrics                             Anesthesia Physical Anesthesia Plan  ASA: II  Anesthesia Plan: General   Post-op Pain Management:    Induction: Intravenous  PONV Risk Score and Plan: 2 and Ondansetron, Dexamethasone and Midazolam  Airway Management Planned: Oral ETT  Additional Equipment:   Intra-op Plan:   Post-operative Plan: Extubation in OR  Informed Consent: I have reviewed the patients History and Physical, chart, labs and discussed the procedure including the risks, benefits and alternatives for the proposed anesthesia with the patient or authorized representative who has indicated his/her understanding and acceptance.   Dental advisory  given  Plan Discussed with: CRNA and Anesthesiologist  Anesthesia Plan Comments:        Anesthesia Quick Evaluation

## 2017-08-18 NOTE — Anesthesia Post-op Follow-up Note (Signed)
Anesthesia QCDR form completed.        

## 2017-08-18 NOTE — Op Note (Signed)
Preoperative diagnosis: Left breast cancer, desire for bilateral mastectomy with immediate reconstruction.  Postop diagnosis: Same.  Operative procedure: Left simple mastectomy with sentinel node biopsy; right simple mastectomy.  Bilateral expander placement.  Operating surgeons: Hervey Ard, MD.; Audelia Hives, DO.  Anesthesia: General endotracheal.  Estimated blood loss: Less than 75 cc.  Clinical note: This 47 year old woman was identified with a left breast cancer on core biopsy.  Given her options for management she desired to proceed to bilateral mastectomy.  The patient received Kefzol prior to the procedure.  Operative note: The patient underwent general endotracheal anesthesia without difficulty.  The breast chest and neck was cleansed with ChloraPrep and draped after the  introduction of 5 cc of 0.5% methylene blue in the left subareolar plexus.  The patient had previously undergone technetium sulfur colloid injection.  Attention was turned to the left breast after draping.  An elliptical incision was made staying about 3 mm away from the edge of the areola and a good distance away from the palpable tumor at the 9 o'clock position.  Flaps were raised to the sternum medially, 1 fingerbreadth below the clavicle superiorly, serratus muscle laterally and the rectus fascia inferiorly.  The breast was elevated off the underlying pectoralis muscle taking the fascia of that muscle with the specimen.  After assuring good hemostasis the axillary envelope was opened and a total of 6 sentinel nodes were identified for blue, 5 hot.  These were sent en masse for routine histology.  Good hemostasis was noted and reconstructed procedure was turned over to Dr. Marla Roe for the left breast expander placement at this time.  Attention was turned to the right breast.  A more modest elliptical incision was made on the side to preserve more skin.  Flaps were again raised as noted above.  The breast  was elevated off the underlying pectoralis muscle and the fascia of that muscle taken with the specimen.  After assuring good hemostasis the remaining procedure was turned over to Dr. Marla Roe for expander placement.  The portions of the procedure related to breast reconstruction will be dictated separately.  The patient tolerated the procedure well and was taken to the recovery room in stable condition.

## 2017-08-18 NOTE — Progress Notes (Signed)
Called Dr. Bary Castilla regarding pain medication per patient request.  Appropriate orders were placed.  Christene Slates  08/18/2017 7:44 PM

## 2017-08-18 NOTE — Op Note (Signed)
Op report    DATE OF OPERATION:  08/18/2017  LOCATION: [facility]  SURGICAL DIVISION: Plastic Surgery  PREOPERATIVE DIAGNOSES:  1. Breast cancer.    POSTOPERATIVE DIAGNOSES:  1. Breast cancer.   PROCEDURE:  1. Bilateral immediate breast reconstruction with placement of Acellular Dermal Matrix and tissue expanders.  SURGEON: Claire Sanger Dillingham, DO  ANESTHESIA:  General.   COMPLICATIONS: None.   IMPLANTS: Left - Mentor [id number]. 375 cc.  Serial Number [id number], 50 cc of saline placed Right - Mentor [id number]. 375 cc.  Serial Number [id number], 50 cc of saline placed Acellular Dermal Matrix 4 x 16 cm on each side  INDICATIONS FOR PROCEDURE:  The patient, Kelsey Klein, is a 47 y.o. female born on Jul 19, 1970, is here for  immediate first stage breast reconstruction with placement of bilateral tissue expander and Acellular dermal matrix. MRN: [id number]  CONSENT:  Informed consent was obtained directly from the patient. Risks, benefits and alternatives were fully discussed. Specific risks including but not limited to bleeding, infection, hematoma, seroma, scarring, pain, implant infection, implant extrusion, capsular contracture, asymmetry, wound healing problems, and need for further surgery were all discussed. The patient did have an ample opportunity to have her questions answered to her satisfaction.   DESCRIPTION OF PROCEDURE:  The patient was taken to the operating room by the general surgery team. SCDs were placed and IV antibiotics were given. The patient's chest was prepped and draped in a sterile fashion. A time out was performed and the implants to be used were identified.  Bilateral mastectomies were performed.  Once the general surgery team had completed their portion of the case the patient was rendered to the plastic and reconstructive surgery team.  Left:  The pectoralis major muscle was lifted from the chest wall with release of the lateral  edge and lateral inframammary fold.  The pocket was irrigated with antibiotic solution and hemostasis was achieved with electrocautery.  The ADM was then prepared according to the manufacture guidelines and slits placed to help with postoperative fluid management.  The ADM was then sutured to the inferior and lateral edge of the inframammary fold with 2-0 PDS starting with an interrupted stitch and then a running stitch.  The lateral portion was sutured to with interrupted sutures after the expander was placed.  The expander was prepared according to the manufacture guidelines, the air evacuated and then it was placed under the ADM and pectoralis major muscle.  The inferior and lateral tabs were used to secure the expander to the chest wall with 2-0 PDS.  The drain was placed at the inframammary fold over the ADM and secured to the skin with 3-0 Silk.  The deep layers were closed with 3-0 Monocryl followed by 4-0 Monocryl.  The skin was closed with 5-0 Monocryl.    Right: The pectoralis major muscle was lifted from the chest wall with release of the lateral edge and lateral inframammary fold.  The pocket was irrigated with antibiotic solution and hemostasis was achieved with electrocautery.  The ADM was then prepared according to the manufacture guidelines and slits placed to help with postoperative fluid management.  The ADM was then sutured to the inferior and lateral edge of the inframammary fold with 2-0 PDS starting with an interrupted stitch and then a running stitch.  The lateral portion was sutured to with interrupted sutures after the expander was placed.  The expander was prepared according to the manufacture guidelines, the air evacuated and then it was placed  under the ADM and pectoralis major muscle.  The inferior and lateral tabs were used to secure the expander to the chest wall with 2-0 PDS.  The drain was placed at the inframammary fold over the ADM and secured to the skin with 3-0 Silk.    The  deep layers were closed with 3-0 Monocryl followed by 4-0 Monocryl.  The skin was closed with 5-0 Monocryl and then dermabond was applied.  The ABDs and breast binder were placed.  The patient tolerated the procedure well and there were no complications.  The patient was allowed to wake from anesthesia and taken to the recovery room in satisfactory condition.

## 2017-08-19 ENCOUNTER — Encounter: Payer: Self-pay | Admitting: General Surgery

## 2017-08-19 DIAGNOSIS — C50512 Malignant neoplasm of lower-outer quadrant of left female breast: Secondary | ICD-10-CM | POA: Diagnosis not present

## 2017-08-19 MED ORDER — HYDROCODONE-ACETAMINOPHEN 7.5-325 MG PO TABS: 1.0000 | ORAL_TABLET | ORAL | 0 refills | Status: DC | PRN

## 2017-08-19 MED ORDER — DIAZEPAM 5 MG PO TABS: 5.0000 mg | ORAL_TABLET | Freq: Four times a day (QID) | ORAL | 0 refills | Status: DC | PRN

## 2017-08-19 MED ORDER — METHOCARBAMOL 750 MG PO TABS: 750.0000 mg | ORAL_TABLET | Freq: Three times a day (TID) | ORAL | 0 refills | Status: DC

## 2017-08-19 NOTE — Plan of Care (Signed)
Patient is tolerating soft diet and ambulated 160 ft around hallway.  Pain is better controlled as well.  Kelsey Klein

## 2017-08-19 NOTE — Progress Notes (Signed)
AVSS. Much better pain control. Nausea without vomiting. Has tolerated some diet. Drainage: Sero sang. Lungs: Clear. IMP: Doing well. Plan: Ambulate, probable d/c later today.

## 2017-08-19 NOTE — Progress Notes (Signed)
Discharge instructions with patient and patient's family with patient's permission. Patient and family instructed with demonstrated teachback on how to empty's and maintain patient's two JP chest drains and educated on the importance of keeping accurate drainage documentation log. Patient and family verbalized understanding. Rx at pharmacy- patient reminded to pick them up. Sedative drug warning given to patient and family. Patient reminded of follow up appointments. To be escorted to vehicle via wheelchair, family to drive patient home.

## 2017-08-20 ENCOUNTER — Telehealth: Payer: Self-pay | Admitting: *Deleted

## 2017-08-20 NOTE — Anesthesia Postprocedure Evaluation (Signed)
Anesthesia Post Note  Patient: Kelsey Klein  Procedure(s) Performed: MASTECTOMY WITH SENTINEL LYMPH NODE BIOPSY,FROZEN SECTION (Bilateral ) BREAST RECONSTRUCTION WITH PLACEMENT OF TISSUE EXPANDER AND FLEX HD (ACELLULAR HYDRATED DERMIS) (Bilateral )  Patient location during evaluation: PACU Anesthesia Type: General Level of consciousness: awake and alert Pain management: pain level controlled Vital Signs Assessment: post-procedure vital signs reviewed and stable Respiratory status: spontaneous breathing, nonlabored ventilation, respiratory function stable and patient connected to nasal cannula oxygen Cardiovascular status: blood pressure returned to baseline and stable Postop Assessment: no apparent nausea or vomiting Anesthetic complications: no     Last Vitals:  Vitals:   08/18/17 1545 08/18/17 1555  BP: 113/79 106/72  Pulse: 76 70  Resp: 13 12  Temp:  36.7 C  SpO2: 99% 100%    Last Pain:  Vitals:   08/19/17 1104  TempSrc:   PainSc: 6                  Precious Haws Almarosa Bohac

## 2017-08-20 NOTE — Telephone Encounter (Signed)
She called to ask for a stronger pain medication. She states the Norco is "not helping at all". She states that the drains are working well, left > right,  left drain 30-40 ml and the right drain at least 10 ml each time emptied. She states she could not sleep well due to the pain. She is aware that Dr Bary Castilla is not in the office until this afternoon. She uses [address].

## 2017-08-20 NOTE — Telephone Encounter (Signed)
I called her back and she states she had already called Dr Eusebio Friendly office and she was going to write for her some different pain medication but she has not picked up the prescription as of yet. Aware of follow up appointment. Appreciates call.

## 2017-08-21 LAB — SURGICAL PATHOLOGY

## 2017-08-23 ENCOUNTER — Telehealth: Payer: Self-pay | Admitting: General Surgery

## 2017-08-23 NOTE — Telephone Encounter (Signed)
Patient called to report that she noted some swelling anterior to the axilla on the left side this morning.  Describes it as the size of a small lemon.  Drains are working well.  No redness.  No particular pain.  She reports that her pain is much improved and she is been more active over the last day or 2.  Patient did have a significant number of lymph glands removed (6), and this may just be a small collection of undrained fluid.  Unless it worsens, she will not need to be seen over the weekend.  She was encouraged to call if she became symptomatic with pain, none at present in this locale, otherwise she will follow-up with Dr. Marla Roe as scheduled on March 12.

## 2017-08-26 NOTE — Telephone Encounter (Signed)
Thank you :)

## 2017-08-31 NOTE — Progress Notes (Deleted)
[place]  [phone] [phone]) [phone]  ID: Kelsey Klein OB: 23-Sep-1970  MR#: [id number]  QDI#:[id number]  Patient Care Team: [facility], [place] as PCP - General (General Practice) Rico Junker, RN as Registered Nurse  CHIEF COMPLAINT: Clinical stage Ib ER PR positive, HER-2 negative invasive lower outer quadrant the left breast.  INTERVAL HISTORY: Patient returns to clinic today as an add-on to further discuss her treatment options.  She currently feels well and is asymptomatic. She has no neurologic complaints.  She denies any recent fevers.  She has a good appetite and denies weight loss.  She has no chest pain or shortness of breath.  She denies any nausea, vomiting, constipation, or diarrhea.  She has no urinary complaints.  Patient offers no specific complaints today.  REVIEW OF SYSTEMS:   Review of Systems  Constitutional: Negative.  Negative for fever, malaise/fatigue and weight loss.  Respiratory: Negative.  Negative for cough and shortness of breath.   Cardiovascular: Negative.  Negative for chest pain and leg swelling.  Gastrointestinal: Negative for abdominal pain.  Genitourinary: Negative.   Musculoskeletal: Negative.   Skin: Negative.   Neurological: Negative.  Negative for weakness.  Psychiatric/Behavioral: The patient is nervous/anxious.     As per HPI. Otherwise, a complete review of systems is negative.  PAST MEDICAL HISTORY: Past Medical History:  Diagnosis Date  . Arthritis    BOTH KNEES  . Cancer ([place])   . Family history of breast cancer     PAST SURGICAL HISTORY: Past Surgical History:  Procedure Laterality Date  . 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 BREAST BIOPSY Left 05/27/2017   Affirm Bx- DCIS  . BREAST RECONSTRUCTION WITH PLACEMENT OF TISSUE EXPANDER AND FLEX HD (ACELLULAR HYDRATED DERMIS) Bilateral 08/18/2017   Procedure:  BREAST RECONSTRUCTION WITH PLACEMENT OF TISSUE EXPANDER AND FLEX HD (ACELLULAR HYDRATED DERMIS);  Surgeon: Wallace Going, DO;  Location: [facility] ORS;  Service: Plastics;  Laterality: Bilateral;  . MASTECTOMY W/ SENTINEL NODE BIOPSY Bilateral 08/18/2017   Procedure: MASTECTOMY WITH SENTINEL LYMPH NODE BIOPSY,FROZEN SECTION;  Surgeon: Robert Bellow, MD;  Location: [facility] ORS;  Service: General;  Laterality: Bilateral;    FAMILY HISTORY: Family History  Problem Relation Age of Onset  . Arthritis Mother   . Cirrhosis Father        deceased 59; adopted  . Hypertension Father   . Hypertension Sister   . Alzheimer's disease Maternal Grandmother   . Breast cancer Paternal Aunt        age at dx unknown  . Breast cancer Paternal Aunt        age at dx unknown    ADVANCED DIRECTIVES (Y/N):  N  HEALTH MAINTENANCE: Social History   Tobacco Use  . Smoking status: Never Smoker  . Smokeless tobacco: Never Used  Substance Use Topics  . Alcohol use: Yes    Comment: OCC  . Drug use: No     Colonoscopy:  PAP:  Bone density:  Lipid panel:  No Known Allergies  Current Outpatient Medications  Medication Sig Dispense Refill  . acetaminophen (TYLENOL) 500 MG tablet Take 500-1,000 mg by mouth daily as needed for moderate pain or headache.    . diazepam (VALIUM) 5 MG tablet Take 1 tablet (5 mg total) by mouth every 6 (six) hours as needed for muscle spasms. 15 tablet 0  . HYDROcodone-acetaminophen (NORCO) 7.5-325 MG tablet Take 1  tablet by mouth every 4 (four) hours as needed for moderate pain. 40 tablet 0  . methocarbamol (ROBAXIN) 750 MG tablet Take 1 tablet (750 mg total) by mouth 3 (three) times daily. 30 tablet 0   No current facility-administered medications for this visit.     OBJECTIVE: There were no vitals filed for this visit.   There is no height or weight on file to calculate BMI.    ECOG FS:0 - Asymptomatic  General: Well-developed, well-nourished, no acute  distress. Eyes: Pink conjunctiva, anicteric sclera. Breast: Deferred today. Lungs: Clear to auscultation bilaterally. Heart: Regular rate and rhythm. No rubs, murmurs, or gallops. Abdomen: Soft, nontender, nondistended. No organomegaly noted, normoactive bowel sounds. Musculoskeletal: No edema, cyanosis, or clubbing. Neuro: Alert, answering all questions appropriately. Cranial nerves grossly intact. Skin: No rashes or petechiae noted. Psych: Normal affect.   LAB RESULTS:  No results found for: NA, K, CL, CO2, GLUCOSE, BUN, CREATININE, CALCIUM, PROT, ALBUMIN, AST, ALT, ALKPHOS, BILITOT, GFRNONAA, GFRAA  No results found for: WBC, NEUTROABS, HGB, HCT, MCV, PLT   STUDIES: Nm Sentinel Node Injection  Result Date: 08/18/2017 CLINICAL DATA:  Left breast cancer. EXAM: NUCLEAR MEDICINE BREAST LYMPHOSCINTIGRAPHY TECHNIQUE: Intradermal injection of radiopharmaceutical was performed at the 12 o'clock, 3 o'clock, 6 o'clock, and 9 o'clock positions around the left nipple. The patient was then sent to the operating room where the sentinel node(s) were identified and removed by the surgeon. RADIOPHARMACEUTICALS:  Total of 1 mCi Millipore-filtered Technetium-61msulfur colloid, injected in four aliquots of 0.25 mCi each. IMPRESSION: Uncomplicated intradermal injection of a total of 1 mCi Technetium-934mulfur colloid for purposes of sentinel node identification. Electronically Signed   By: GlAletta Edouard.D.   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  PLAN:    1. 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  Given the extent of DCIS, have ordered a breast MRI for further evaluation although this is not been completed.  After discussion with her surgeon, patient does not wish to undergo neoadjuvant  chemotherapy is likely going to proceed with lumpectomy or possible mastectomy with reconstruction.  Chemotherapy options were discussed once again.  Patient's final pathology is staged as a T1c, will send Oncotype to assess if chemotherapy is necessary.  If her final pathology was T2, will recommend adjuvant Adriamycin, Cytoxan, and Taxol.  Patient has already had a MUGA scan on June 11, 2017 revealed an EF of 61%.  Previously, genetic counseling was discussed, but patient wishes to defer this at this time.  If she has a lumpectomy, she also will require adjuvant XRT.  Finally, patient will benefit from tamoxifen for a total of 5-10 years at the completion of all her treatments.  Patient will follow-up 1-2 weeks after her surgery to discuss the final pathology results and treatment planning.  Approximately 30 minutes was spent in discussion of which greater than 50% was consultation.  Patient expressed understanding and was in agreement with this plan. She also understands that She can call clinic at any time with any questions, concerns, or complaints.   Cancer Staging Primary cancer of lower-outer quadrant of left breast ([facility] Center[facility]aging form: Breast, AJCC 8th Edition - Clinical stage from 05/31/2017: Stage IB (cT2, cN0, cM0, G1, ER: Positive, PR: Positive, HER2: Negative) - Signed by FiLloyd Huger  MD on 05/31/2017   Lloyd Huger, MD   08/31/2017 10:12 AM

## 2017-09-02 ENCOUNTER — Ambulatory Visit (INDEPENDENT_AMBULATORY_CARE_PROVIDER_SITE_OTHER): Payer: Medicaid Other | Admitting: General Surgery

## 2017-09-02 ENCOUNTER — Encounter: Payer: Self-pay | Admitting: General Surgery

## 2017-09-02 VITALS — BP 102/76 | HR 86 | Resp 11 | Ht 64.0 in | Wt 148.0 lb

## 2017-09-02 DIAGNOSIS — Z170 Estrogen receptor positive status [ER+]: Secondary | ICD-10-CM

## 2017-09-02 DIAGNOSIS — C50512 Malignant neoplasm of lower-outer quadrant of left female breast: Secondary | ICD-10-CM

## 2017-09-02 NOTE — Progress Notes (Signed)
Patient ID: Kelsey Klein, female   DOB: 12-Aug-1970, 47 y.o.   MRN: [id number]  Chief Complaint  Patient presents with  . Routine Post Op    HPI Kelsey Klein is a 47 y.o. female.  Here for postoperative visit, bilateral mastectomy with reconstruction by Dr Marla Roe on 08-18-17. She states she has an appointment with her next week. She states she is having some sharp shooting pains, that come and go. She stats the pain has gradually improved. Drains remain in place. She states the right side hurts worse than the left.  She is here with her mother, Kelsey Klein.  HPI  Past Medical History:  Diagnosis Date  . Arthritis    BOTH KNEES  . Breast cancer, stage 1, estrogen receptor negative, right ([place]) 08/19/2015    18 mm; pT1c  pN0 (0/6nodes positive). ER/PR +; Her 2 neu not overexpressed  . Family history of breast cancer     Past Surgical History:  Procedure Laterality Date  . 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 BREAST BIOPSY Left 05/27/2017   Affirm Bx- DCIS  . BREAST RECONSTRUCTION WITH PLACEMENT OF TISSUE EXPANDER AND FLEX HD (ACELLULAR HYDRATED DERMIS) Bilateral 08/18/2017   Procedure: BREAST RECONSTRUCTION WITH PLACEMENT OF TISSUE EXPANDER AND FLEX HD (ACELLULAR HYDRATED DERMIS);  Surgeon: Wallace Going, DO;  Location: [facility] ORS;  Service: Plastics;  Laterality: Bilateral;  . MASTECTOMY W/ SENTINEL NODE BIOPSY Bilateral 08/18/2017   Procedure: MASTECTOMY WITH SENTINEL LYMPH NODE BIOPSY,FROZEN SECTION;  Surgeon: Robert Bellow, MD;  Location: [facility] ORS;  Service: General;  Laterality: Bilateral;    Family History  Problem Relation Age of Onset  . Arthritis Mother   . Cirrhosis Father        deceased 62; adopted  . Hypertension Father   . Hypertension Sister   . Alzheimer's disease Maternal Grandmother   . Breast cancer Paternal Aunt        age at dx unknown  . Breast cancer Paternal Aunt        age  at dx unknown    Social History Social History   Tobacco Use  . Smoking status: Never Smoker  . Smokeless tobacco: Never Used  Substance Use Topics  . Alcohol use: Yes    Comment: OCC  . Drug use: No    No Known Allergies  Current Outpatient Medications  Medication Sig Dispense Refill  . acetaminophen (TYLENOL) 500 MG tablet Take 500-1,000 mg by mouth daily as needed for moderate pain or headache.    . diazepam (VALIUM) 5 MG tablet Take 1 tablet (5 mg total) by mouth every 6 (six) hours as needed for muscle spasms. 15 tablet 0  . HYDROcodone-acetaminophen (NORCO) 7.5-325 MG tablet Take 1 tablet by mouth every 4 (four) hours as needed for moderate pain. 40 tablet 0  . methocarbamol (ROBAXIN) 750 MG tablet Take 1 tablet (750 mg total) by mouth 3 (three) times daily. 30 tablet 0  . oxyCODONE (OXY IR/ROXICODONE) 5 MG immediate release tablet take 1 tablet by mouth every 4 hours if needed for UP TO 5 DAYS FOR MODERATE or severe pain  0   No current facility-administered medications for this visit.     Review of Systems Review of Systems  Constitutional: Positive for chills.  Respiratory: Negative.   Cardiovascular: Negative.     Blood pressure 102/76, pulse 86, resp. rate 11, height 5' 4"  (1.626 m), weight 148 lb (  67.1 kg), last menstrual period 08/16/2017, SpO2 96 %.  Physical Exam Physical Exam  Constitutional: She is oriented to person, place, and time. She appears well-developed and well-nourished.  Pulmonary/Chest:    bilateral mastectomy incisions clean. Muscle tenderness left axilla.  Neurological: She is alert and oriented to person, place, and time.  Skin: Skin is warm and dry.  Psychiatric: Her behavior is normal.    Data Reviewed Pathology reviewed. T1c malignancy on the left, fibrocystic changes on the right.  Assessment    Doing well post bilateral mastectomy and immediate implant expander placement.    Plan    The patient is aware to use a heating  pad as needed for comfort. Follow up in 1 month     HPI, Physical Exam, Assessment and Plan have been scribed under the direction and in the presence of Robert Bellow, MD. Karie Fetch, RN  I have completed the exam and reviewed the above documentation for accuracy and completeness.  I agree with the above.  Haematologist has been used and any errors in dictation or transcription are unintentional.  Hervey Ard, M.D., F.A.C.S.  Forest Gleason Byrnett 09/03/2017, 12:12 PM

## 2017-09-02 NOTE — Patient Instructions (Addendum)
The patient is aware to call back for any questions or concerns. The patient is aware to use a heating pad as needed for comfort.  

## 2017-09-03 ENCOUNTER — Encounter: Payer: Self-pay | Admitting: General Surgery

## 2017-09-03 ENCOUNTER — Inpatient Hospital Stay: Payer: Medicaid Other | Admitting: Oncology

## 2017-09-07 NOTE — Progress Notes (Deleted)
[name]  Telephone:([phone] [phone]) [phone]  ID: Kelsey Klein OB: April 10, 1971  MR#: [id number]  TDD#:[id number]  Patient Care Team: Center, [facility] as PCP - General (General Practice) Rico Junker, RN as Registered Nurse  CHIEF COMPLAINT: Clinical stage Ib ER PR positive, HER-2 negative invasive lower outer quadrant the left breast.  INTERVAL HISTORY: Patient returns to clinic today as an add-on to further discuss her treatment options.  She currently feels well and is asymptomatic. She has no neurologic complaints.  She denies any recent fevers.  She has a good appetite and denies weight loss.  She has no chest pain or shortness of breath.  She denies any nausea, vomiting, constipation, or diarrhea.  She has no urinary complaints.  Patient offers no specific complaints today.  REVIEW OF SYSTEMS:   Review of Systems  Constitutional: Negative.  Negative for fever, malaise/fatigue and weight loss.  Respiratory: Negative.  Negative for cough and shortness of breath.   Cardiovascular: Negative.  Negative for chest pain and leg swelling.  Gastrointestinal: Negative for abdominal pain.  Genitourinary: Negative.   Musculoskeletal: Negative.   Skin: Negative.   Neurological: Negative.  Negative for weakness.  Psychiatric/Behavioral: The patient is nervous/anxious.     As per HPI. Otherwise, a complete review of systems is negative.  PAST MEDICAL HISTORY: Past Medical History:  Diagnosis Date  . Arthritis    BOTH KNEES  . Breast cancer, stage 1, estrogen receptor negative, right ([place]) 08/19/2015    18 mm; pT1c  pN0 (0/6nodes positive). ER/PR +; Her 2 neu not overexpressed  . Family history of breast cancer     PAST SURGICAL HISTORY: Past Surgical History:  Procedure Laterality Date  . 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 BREAST BIOPSY Left 05/27/2017   Affirm Bx-  DCIS  . BREAST RECONSTRUCTION WITH PLACEMENT OF TISSUE EXPANDER AND FLEX HD (ACELLULAR HYDRATED DERMIS) Bilateral 08/18/2017   Procedure: BREAST RECONSTRUCTION WITH PLACEMENT OF TISSUE EXPANDER AND FLEX HD (ACELLULAR HYDRATED DERMIS);  Surgeon: Wallace Going, DO;  Location: [facility] ORS;  Service: Plastics;  Laterality: Bilateral;  . MASTECTOMY W/ SENTINEL NODE BIOPSY Bilateral 08/18/2017   Procedure: MASTECTOMY WITH SENTINEL LYMPH NODE BIOPSY,FROZEN SECTION;  Surgeon: Robert Bellow, MD;  Location: [facility] ORS;  Service: General;  Laterality: Bilateral;    FAMILY HISTORY: Family History  Problem Relation Age of Onset  . Arthritis Mother   . Cirrhosis Father        deceased 47; adopted  . Hypertension Father   . Hypertension Sister   . Alzheimer's disease Maternal Grandmother   . Breast cancer Paternal Aunt        age at dx unknown  . Breast cancer Paternal Aunt        age at dx unknown    ADVANCED DIRECTIVES (Y/N):  N  HEALTH MAINTENANCE: Social History   Tobacco Use  . Smoking status: Never Smoker  . Smokeless tobacco: Never Used  Substance Use Topics  . Alcohol use: Yes    Comment: OCC  . Drug use: No     Colonoscopy:  PAP:  Bone density:  Lipid panel:  No Known Allergies  Current Outpatient Medications  Medication Sig Dispense Refill  . acetaminophen (TYLENOL) 500 MG tablet Take 500-1,000 mg by mouth daily as needed for moderate pain or headache.    . diazepam (VALIUM) 5 MG tablet Take 1 tablet (5 mg  total) by mouth every 6 (six) hours as needed for muscle spasms. 15 tablet 0  . HYDROcodone-acetaminophen (NORCO) 7.5-325 MG tablet Take 1 tablet by mouth every 4 (four) hours as needed for moderate pain. 40 tablet 0  . methocarbamol (ROBAXIN) 750 MG tablet Take 1 tablet (750 mg total) by mouth 3 (three) times daily. 30 tablet 0  . oxyCODONE (OXY IR/ROXICODONE) 5 MG immediate release tablet take 1 tablet by mouth every 4 hours if needed for UP TO 5 DAYS FOR MODERATE  or severe pain  0   No current facility-administered medications for this visit.     OBJECTIVE: There were no vitals filed for this visit.   There is no height or weight on file to calculate BMI.    ECOG FS:0 - Asymptomatic  General: Well-developed, well-nourished, no acute distress. Eyes: Pink conjunctiva, anicteric sclera. Breast: Deferred today. Lungs: Clear to auscultation bilaterally. Heart: Regular rate and rhythm. No rubs, murmurs, or gallops. Abdomen: Soft, nontender, nondistended. No organomegaly noted, normoactive bowel sounds. Musculoskeletal: No edema, cyanosis, or clubbing. Neuro: Alert, answering all questions appropriately. Cranial nerves grossly intact. Skin: No rashes or petechiae noted. Psych: Normal affect.   LAB RESULTS:  No results found for: NA, K, CL, CO2, GLUCOSE, BUN, CREATININE, CALCIUM, PROT, ALBUMIN, AST, ALT, ALKPHOS, BILITOT, GFRNONAA, GFRAA  No results found for: WBC, NEUTROABS, HGB, HCT, MCV, PLT   STUDIES: Nm Sentinel Node Injection  Result Date: 08/18/2017 CLINICAL DATA:  Left breast cancer. EXAM: NUCLEAR MEDICINE BREAST LYMPHOSCINTIGRAPHY TECHNIQUE: Intradermal injection of radiopharmaceutical was performed at the 12 o'clock, 3 o'clock, 6 o'clock, and 9 o'clock positions around the left nipple. The patient was then sent to the operating room where the sentinel node(s) were identified and removed by the surgeon. RADIOPHARMACEUTICALS:  Total of 1 mCi Millipore-filtered Technetium-69msulfur colloid, injected in four aliquots of 0.25 mCi each. IMPRESSION: Uncomplicated intradermal injection of a total of 1 mCi Technetium-92mulfur colloid for purposes of sentinel node identification. Electronically Signed   By: GlAletta Edouard.D.   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  PLAN:    1. 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  Given the extent of DCIS, have ordered a breast MRI for further evaluation although this is not been completed.  After discussion with her surgeon, patient does not wish to undergo neoadjuvant chemotherapy is likely going to proceed with lumpectomy or possible mastectomy with reconstruction.  Chemotherapy options were discussed once again.  Patient's final pathology is staged as a T1c, will send Oncotype to assess if chemotherapy is necessary.  If her final pathology was T2, will recommend adjuvant Adriamycin, Cytoxan, and Taxol.  Patient has already had a MUGA scan on June 11, 2017 revealed an EF of 61%.  Previously, genetic counseling was discussed, but patient wishes to defer this at this time.  If she has a lumpectomy, she also will require adjuvant XRT.  Finally, patient will benefit from tamoxifen for a total of 5-10 years at the completion of all her treatments.  Patient will follow-up 1-2 weeks after her surgery to discuss the final pathology results and treatment planning.  Approximately 30 minutes was spent in discussion of which greater than 50% was consultation.  Patient expressed understanding and was in agreement with this plan. She also understands  that She can call clinic at any time with any questions, concerns, or complaints.   Cancer Staging Primary cancer of lower-outer quadrant of left breast [facility]) Staging form: Breast, AJCC 8th Edition - Clinical stage from 05/31/2017: Stage IB (cT2, cN0, cM0, G1, ER: Positive, PR: Positive, HER2: Negative) - Signed by Lloyd Huger, MD on 05/31/2017   Lloyd Huger, MD   09/07/2017 10:19 PM

## 2017-09-08 DIAGNOSIS — Z9013 Acquired absence of bilateral breasts and nipples: Secondary | ICD-10-CM | POA: Insufficient documentation

## 2017-09-09 ENCOUNTER — Encounter: Payer: Self-pay | Admitting: *Deleted

## 2017-09-10 ENCOUNTER — Inpatient Hospital Stay: Payer: Medicaid Other | Admitting: Oncology

## 2017-09-21 NOTE — Progress Notes (Signed)
[facility]  [phone] [phone]  ID: Kelsey Klein OB: 11/07/1970  MR#: [id number]  CSN#:[id number]  Patient Care Team: Center, [facility] as PCP - General (General Practice) Lambert, Sheena M, RN as Registered Nurse  CHIEF COMPLAINT: Pathologic stage Ia ER-PR positive, HER-2 negative invasive lower outer quadrant the left breast.  INTERVAL HISTORY: Patient has now undergone bilateral mastectomy with reconstruction and tolerated the procedure well.  She continues to have follow-up with plastic surgery.  She continues to have surgical tenderness. She has no neurologic complaints.  She denies any recent fevers or illnesses.  She has a good appetite and denies weight loss.  She has no chest pain or shortness of breath.  She denies any nausea, vomiting, constipation, or diarrhea.  She has no urinary complaints.  Patient offers no specific complaints today.  REVIEW OF SYSTEMS:   Review of Systems  Constitutional: Negative.  Negative for fever, malaise/fatigue and weight loss.  Respiratory: Negative.  Negative for cough and shortness of breath.   Cardiovascular: Negative.  Negative for chest pain and leg swelling.  Gastrointestinal: Negative.  Negative for abdominal pain.  Genitourinary: Negative.  Negative for dysuria.  Musculoskeletal: Negative.   Skin: Negative.  Negative for rash.  Neurological: Negative.  Negative for weakness.  Psychiatric/Behavioral: Negative.  The patient is not nervous/anxious.     As per HPI. Otherwise, a complete review of systems is negative.  PAST MEDICAL HISTORY: Past Medical History:  Diagnosis Date  . Arthritis    BOTH KNEES  . Breast cancer, stage 1, estrogen receptor negative, right ([facility]) 08/19/2015    18 mm; pT1c  pN0 (0/6nodes positive). ER/PR +; Her 2 neu not overexpressed  . Family history of breast cancer     PAST SURGICAL HISTORY: Past Surgical History:  Procedure Laterality  Date  . 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 BREAST BIOPSY Left 05/27/2017   Affirm Bx- DCIS  . BREAST RECONSTRUCTION WITH PLACEMENT OF TISSUE EXPANDER AND FLEX HD (ACELLULAR HYDRATED DERMIS) Bilateral 08/18/2017   Procedure: BREAST RECONSTRUCTION WITH PLACEMENT OF TISSUE EXPANDER AND FLEX HD (ACELLULAR HYDRATED DERMIS);  Surgeon: Dillingham, Claire S, DO;  Location: [facility] ORS;  Service: Plastics;  Laterality: Bilateral;  . MASTECTOMY W/ SENTINEL NODE BIOPSY Bilateral 08/18/2017   Procedure: MASTECTOMY WITH SENTINEL LYMPH NODE BIOPSY,FROZEN SECTION;  Surgeon: Byrnett, Jeffrey W, MD;  Location: [facility] ORS;  Service: General;  Laterality: Bilateral;    FAMILY HISTORY: Family History  Problem Relation Age of Onset  . Arthritis Mother   . Cirrhosis Father        deceased 55; adopted  . Hypertension Father   . Hypertension Sister   . Alzheimer's disease Maternal Grandmother   . Breast cancer Paternal Aunt        age at dx unknown  . Breast cancer Paternal Aunt        age at dx unknown    ADVANCED DIRECTIVES (Y/N):  N  HEALTH MAINTENANCE: Social History   Tobacco Use  . Smoking status: Never Smoker  . Smokeless tobacco: Never Used  Substance Use Topics  . Alcohol use: Yes    Comment: OCC  . Drug use: No     Colonoscopy:  PAP:  Bone density:  Lipid panel:  No Known Allergies  Current Outpatient Medications  Medication Sig Dispense Refill  . aspirin 325 MG tablet Take 325 mg by mouth daily.    .   methocarbamol (ROBAXIN) 750 MG tablet Take 1 tablet (750 mg total) by mouth 3 (three) times daily. 30 tablet 0  . oxyCODONE (OXY IR/ROXICODONE) 5 MG immediate release tablet take 1 tablet by mouth every 4 hours if needed for UP TO 5 DAYS FOR MODERATE or severe pain  0  . acetaminophen (TYLENOL) 500 MG tablet Take 500-1,000 mg by mouth daily as needed for moderate pain or headache.    . tamoxifen (NOLVADEX) 20 MG tablet Take 1  tablet (20 mg total) by mouth daily. 90 tablet 2   No current facility-administered medications for this visit.     OBJECTIVE: Vitals:   09/24/17 1045  BP: 116/76  Pulse: 83  Resp: 18  Temp: (!) 96.3 F (35.7 C)     Body mass index is 25.2 kg/m.    ECOG FS:0 - Asymptomatic  General: Well-developed, well-nourished, no acute distress. Eyes: Pink conjunctiva, anicteric sclera. Breast: Bilateral mastectomy with reconstruction, exam deferred today. Lungs: Clear to auscultation bilaterally. Heart: Regular rate and rhythm. No rubs, murmurs, or gallops. Abdomen: Soft, nontender, nondistended. No organomegaly noted, normoactive bowel sounds. Musculoskeletal: No edema, cyanosis, or clubbing. Neuro: Alert, answering all questions appropriately. Cranial nerves grossly intact. Skin: No rashes or petechiae noted. Psych: Normal affect.  LAB RESULTS:  No results found for: NA, K, CL, CO2, GLUCOSE, BUN, CREATININE, CALCIUM, PROT, ALBUMIN, AST, ALT, ALKPHOS, BILITOT, GFRNONAA, GFRAA  No results found for: WBC, NEUTROABS, HGB, HCT, MCV, PLT   STUDIES: No results found.  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  Oncotype DX score 13, low risk of recurrence.  PLAN:    1.  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  Because of this she did not require adjuvant XRT.  Because of her low risk Oncotype score, she does not require chemotherapy.  Patient reports she is premenopausal therefore was given a prescription for tamoxifen which she will be required to take for a minimum of 5 years completing in April 2024.  No further interventions are needed at this time.  Return to clinic in 3 months for further evaluation. 2.  Breast reconstruction: Continue treatments and follow-up with plastic surgery as  indicated.  Approximately 30 minutes was spent in discussion and chart review of which greater than 50% was consultation.  Patient expressed understanding and was in agreement with this plan. She also understands that She can call clinic at any time with any questions, concerns, or complaints.   Cancer Staging Primary cancer of lower-outer quadrant of left breast [name]) Staging form: Breast, AJCC 8th Edition - Clinical stage from 05/31/2017: Stage IB (cT2, cN0, cM0, G1, ER: Positive, PR: Positive, HER2: Negative) - Signed by Lloyd Huger, MD on 05/31/2017 - Pathologic stage from 09/26/2017: Stage IA (pT1c, pN0, cM0, G2, ER+, PR+, HER2-, Oncotype DX score: 13) - Signed by Lloyd Huger, MD on 09/26/2017   Lloyd Huger, MD   09/26/2017 1:10 PM

## 2017-09-24 ENCOUNTER — Other Ambulatory Visit: Payer: Self-pay

## 2017-09-24 ENCOUNTER — Inpatient Hospital Stay: Payer: Medicaid Other | Attending: Oncology | Admitting: Oncology

## 2017-09-24 VITALS — BP 116/76 | HR 83 | Temp 96.3°F | Resp 18 | Wt 146.8 lb

## 2017-09-24 DIAGNOSIS — Z9013 Acquired absence of bilateral breasts and nipples: Secondary | ICD-10-CM | POA: Diagnosis not present

## 2017-09-24 DIAGNOSIS — Z170 Estrogen receptor positive status [ER+]: Secondary | ICD-10-CM | POA: Diagnosis not present

## 2017-09-24 DIAGNOSIS — C50512 Malignant neoplasm of lower-outer quadrant of left female breast: Secondary | ICD-10-CM | POA: Diagnosis not present

## 2017-09-24 MED ORDER — TAMOXIFEN CITRATE 20 MG PO TABS: 20.0000 mg | ORAL_TABLET | Freq: Every day | ORAL | 2 refills | Status: DC

## 2017-09-24 NOTE — Progress Notes (Signed)
Here for follow up.stated she is " doing just fine"

## 2017-09-30 ENCOUNTER — Ambulatory Visit: Payer: Medicaid Other | Admitting: General Surgery

## 2017-10-13 ENCOUNTER — Encounter: Payer: Self-pay | Admitting: General Surgery

## 2017-11-25 ENCOUNTER — Ambulatory Visit: Payer: Self-pay | Admitting: Plastic Surgery

## 2017-11-25 DIAGNOSIS — C50812 Malignant neoplasm of overlapping sites of left female breast: Secondary | ICD-10-CM

## 2017-11-25 NOTE — H&P (Signed)
Kelsey Klein is an 47 y.o. female.   Chief Complaint: Left breast cancer HPI:The patient is a 47 y.o. yrs old bf here bilateral breast reconstruction with removal of bilateral expanders and placement of implants.  She was diagnosed with Invasive lower outer quadrant LEFT breast cancer, clinical stage Ib, ER / PR positive, HER-2 negative.  She noted a lump in the breast ~ 4 months ago and it was getting larger.  She underwent a mammogram, ultrasound and then biopsy.  She is 5 feet 4 inches tall, weight is 142 pounds and preop bra 34 B.  She has A looking breast with very little upper poll fullness.  She has one child and did not breast feed.  She is thinking about having a left mastectomy.  She has had a hard time making a decision about treatment or reconstruction.  She started of with not wanting any implant based reconstruction.   Past Medical History:  Diagnosis Date  . Arthritis    BOTH KNEES  . Breast cancer, stage 1, estrogen receptor negative, right ([place]) 08/19/2015    18 mm; pT1c  pN0 (0/6nodes positive). ER/PR +; Her 2 neu not overexpressed  . Family history of breast cancer     Past Surgical History:  Procedure Laterality Date  . 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 BREAST BIOPSY Left 05/27/2017   Affirm Bx- DCIS  . BREAST RECONSTRUCTION WITH PLACEMENT OF TISSUE EXPANDER AND FLEX HD (ACELLULAR HYDRATED DERMIS) Bilateral 08/18/2017   Procedure: BREAST RECONSTRUCTION WITH PLACEMENT OF TISSUE EXPANDER AND FLEX HD (ACELLULAR HYDRATED DERMIS);  Surgeon: Wallace Going, DO;  Location: [facility] ORS;  Service: Plastics;  Laterality: Bilateral;  . MASTECTOMY W/ SENTINEL NODE BIOPSY Bilateral 08/18/2017   Procedure: MASTECTOMY WITH SENTINEL LYMPH NODE BIOPSY,FROZEN SECTION;  Surgeon: Robert Bellow, MD;  Location: [facility] ORS;  Service: General;  Laterality: Bilateral;    Family History  Problem Relation Age of Onset  .  Arthritis Mother   . Cirrhosis Father        deceased 5; adopted  . Hypertension Father   . Hypertension Sister   . Alzheimer's disease Maternal Grandmother   . Breast cancer Paternal Aunt        age at dx unknown  . Breast cancer Paternal Aunt        age at dx unknown   Social History:  reports that she has never smoked. She has never used smokeless tobacco. She reports that she drinks alcohol. She reports that she does not use drugs.  Allergies: No Known Allergies   (Not in a hospital admission)  No results found for this or any previous visit (from the past 48 hour(s)). No results found.  Review of Systems  Constitutional: Negative.   HENT: Negative.   Eyes: Negative.   Respiratory: Negative.   Cardiovascular: Negative.   Gastrointestinal: Negative.   Genitourinary: Negative.   Musculoskeletal: Negative.   Skin: Negative.   Neurological: Negative.   Psychiatric/Behavioral: Negative.     There were no vitals taken for this visit. Physical Exam  Constitutional: She is oriented to person, place, and time. She appears well-developed and well-nourished.  HENT:  Head: Normocephalic and atraumatic.  Eyes: Pupils are equal, round, and reactive to light. EOM are normal.  Cardiovascular: Normal rate.  Respiratory: Effort normal.  GI: Soft.  Musculoskeletal: Normal range of motion.  Neurological: She is alert and oriented to person, place, and time.  Skin: Skin is warm. No erythema.  Psychiatric: She has a normal mood and affect. Her behavior is normal. Judgment and thought content normal.     Assessment/Plan Plan for removal of expanders and placement of implants bilateraly. The risks that can be encountered with and after placement of a breast expander placement were discussed.  [place], DO 11/25/2017, 9:30 AM

## 2017-11-26 ENCOUNTER — Encounter: Payer: Self-pay | Admitting: *Deleted

## 2017-12-03 ENCOUNTER — Encounter (HOSPITAL_BASED_OUTPATIENT_CLINIC_OR_DEPARTMENT_OTHER): Payer: Self-pay | Admitting: *Deleted

## 2017-12-03 ENCOUNTER — Other Ambulatory Visit: Payer: Self-pay

## 2017-12-05 ENCOUNTER — Ambulatory Visit: Payer: Self-pay | Admitting: Physician Assistant

## 2017-12-10 ENCOUNTER — Ambulatory Visit (HOSPITAL_BASED_OUTPATIENT_CLINIC_OR_DEPARTMENT_OTHER): Payer: Medicaid Other | Admitting: Anesthesiology

## 2017-12-10 ENCOUNTER — Other Ambulatory Visit: Payer: Self-pay

## 2017-12-10 ENCOUNTER — Ambulatory Visit (HOSPITAL_BASED_OUTPATIENT_CLINIC_OR_DEPARTMENT_OTHER)
Admission: RE | Admit: 2017-12-10 | Discharge: 2017-12-10 | Disposition: A | Discharge status: Home / Self Care | Payer: Medicaid Other | Source: Ambulatory Visit | Attending: Plastic Surgery | Admitting: Plastic Surgery

## 2017-12-10 ENCOUNTER — Encounter (HOSPITAL_BASED_OUTPATIENT_CLINIC_OR_DEPARTMENT_OTHER)
Admission: RE | Disposition: A | Discharge status: Home / Self Care | Payer: Self-pay | Source: Ambulatory Visit | Attending: Plastic Surgery

## 2017-12-10 ENCOUNTER — Encounter (HOSPITAL_BASED_OUTPATIENT_CLINIC_OR_DEPARTMENT_OTHER): Payer: Self-pay | Admitting: Plastic Surgery

## 2017-12-10 DIAGNOSIS — Z4431 Encounter for fitting and adjustment of external right breast prosthesis: Secondary | ICD-10-CM | POA: Diagnosis present

## 2017-12-10 DIAGNOSIS — Z9013 Acquired absence of bilateral breasts and nipples: Secondary | ICD-10-CM | POA: Insufficient documentation

## 2017-12-10 DIAGNOSIS — Z803 Family history of malignant neoplasm of breast: Secondary | ICD-10-CM | POA: Insufficient documentation

## 2017-12-10 DIAGNOSIS — Z853 Personal history of malignant neoplasm of breast: Secondary | ICD-10-CM | POA: Diagnosis not present

## 2017-12-10 DIAGNOSIS — C50812 Malignant neoplasm of overlapping sites of left female breast: Secondary | ICD-10-CM

## 2017-12-10 DIAGNOSIS — Z4432 Encounter for fitting and adjustment of external left breast prosthesis: Secondary | ICD-10-CM | POA: Diagnosis not present

## 2017-12-10 HISTORY — PX: REMOVAL OF TISSUE EXPANDER AND PLACEMENT OF IMPLANT: SHX6457

## 2017-12-10 SURGERY — REMOVAL, TISSUE EXPANDER, BREAST, WITH IMPLANT INSERTION
Anesthesia: General | Site: Breast | Laterality: Bilateral

## 2017-12-10 MED ORDER — ONDANSETRON HCL 4 MG/2ML IJ SOLN
INTRAMUSCULAR | Status: DC | PRN
  Administered 2017-12-10: 4 mg via INTRAVENOUS

## 2017-12-10 MED ORDER — OXYCODONE HCL 5 MG PO TABS: 5.0000 mg | ORAL_TABLET | ORAL | Status: DC | PRN

## 2017-12-10 MED ORDER — EPHEDRINE SULFATE 50 MG/ML IJ SOLN
INTRAMUSCULAR | Status: AC
  Filled 2017-12-10: qty 1

## 2017-12-10 MED ORDER — SODIUM CHLORIDE 0.9% FLUSH: 3.0000 mL | Freq: Two times a day (BID) | INTRAVENOUS | Status: DC

## 2017-12-10 MED ORDER — ONDANSETRON HCL 4 MG/2ML IJ SOLN
INTRAMUSCULAR | Status: AC
  Filled 2017-12-10: qty 2

## 2017-12-10 MED ORDER — CEFAZOLIN SODIUM-DEXTROSE 2-4 GM/100ML-% IV SOLN
INTRAVENOUS | Status: AC
  Filled 2017-12-10: qty 100

## 2017-12-10 MED ORDER — ACETAMINOPHEN 650 MG RE SUPP: 650.0000 mg | RECTAL | Status: DC | PRN

## 2017-12-10 MED ORDER — FENTANYL CITRATE (PF) 100 MCG/2ML IJ SOLN
INTRAMUSCULAR | Status: AC
  Filled 2017-12-10: qty 2

## 2017-12-10 MED ORDER — MIDAZOLAM HCL 2 MG/2ML IJ SOLN
1.0000 mg | INTRAMUSCULAR | Status: DC | PRN
  Administered 2017-12-10: 2 mg via INTRAVENOUS

## 2017-12-10 MED ORDER — HYDROMORPHONE HCL 1 MG/ML IJ SOLN: 0.2500 mg | INTRAMUSCULAR | Status: DC | PRN

## 2017-12-10 MED ORDER — DEXAMETHASONE SODIUM PHOSPHATE 4 MG/ML IJ SOLN
INTRAMUSCULAR | Status: DC | PRN
  Administered 2017-12-10: 10 mg via INTRAVENOUS

## 2017-12-10 MED ORDER — CELECOXIB 200 MG PO CAPS
ORAL_CAPSULE | ORAL | Status: AC
Start: 2017-12-10 — End: ?
  Filled 2017-12-10: qty 1

## 2017-12-10 MED ORDER — PROMETHAZINE HCL 25 MG/ML IJ SOLN: 6.2500 mg | INTRAMUSCULAR | Status: DC | PRN

## 2017-12-10 MED ORDER — LACTATED RINGERS IV SOLN
INTRAVENOUS | Status: DC
  Administered 2017-12-10: 07:00:00 via INTRAVENOUS

## 2017-12-10 MED ORDER — BUPIVACAINE-EPINEPHRINE (PF) 0.25% -1:200000 IJ SOLN
INTRAMUSCULAR | Status: AC
Start: 2017-12-10 — End: ?
  Filled 2017-12-10: qty 60

## 2017-12-10 MED ORDER — SCOPOLAMINE 1 MG/3DAYS TD PT72
1.0000 | MEDICATED_PATCH | Freq: Once | TRANSDERMAL | Status: DC | PRN
  Administered 2017-12-10: 1.5 mg via TRANSDERMAL

## 2017-12-10 MED ORDER — EPHEDRINE SULFATE 50 MG/ML IJ SOLN
INTRAMUSCULAR | Status: DC | PRN
  Administered 2017-12-10: 10 mg via INTRAVENOUS

## 2017-12-10 MED ORDER — CELECOXIB 200 MG PO CAPS
200.0000 mg | ORAL_CAPSULE | Freq: Once | ORAL | Status: AC | PRN
  Administered 2017-12-10: 200 mg via ORAL

## 2017-12-10 MED ORDER — ACETAMINOPHEN 500 MG PO TABS: 500.0000 mg | ORAL_TABLET | ORAL | Status: DC | PRN

## 2017-12-10 MED ORDER — MIDAZOLAM HCL 2 MG/2ML IJ SOLN
INTRAMUSCULAR | Status: AC
  Filled 2017-12-10: qty 2

## 2017-12-10 MED ORDER — ACETAMINOPHEN 160 MG/5ML PO SOLN: 960.0000 mg | Freq: Once | ORAL | Status: AC

## 2017-12-10 MED ORDER — PROPOFOL 10 MG/ML IV BOLUS
INTRAVENOUS | Status: DC | PRN
  Administered 2017-12-10: 200 mg via INTRAVENOUS

## 2017-12-10 MED ORDER — CEFAZOLIN SODIUM-DEXTROSE 2-4 GM/100ML-% IV SOLN
2.0000 g | INTRAVENOUS | Status: AC
  Administered 2017-12-10 (×2): 2 g via INTRAVENOUS

## 2017-12-10 MED ORDER — SCOPOLAMINE 1 MG/3DAYS TD PT72
MEDICATED_PATCH | TRANSDERMAL | Status: AC
  Filled 2017-12-10: qty 1

## 2017-12-10 MED ORDER — FENTANYL CITRATE (PF) 100 MCG/2ML IJ SOLN
50.0000 ug | INTRAMUSCULAR | Status: AC | PRN
  Administered 2017-12-10: 50 ug via INTRAVENOUS
  Administered 2017-12-10: 100 ug via INTRAVENOUS
  Administered 2017-12-10: 50 ug via INTRAVENOUS

## 2017-12-10 MED ORDER — SODIUM CHLORIDE 0.9% FLUSH: 3.0000 mL | INTRAVENOUS | Status: DC | PRN

## 2017-12-10 MED ORDER — LIDOCAINE HCL (CARDIAC) PF 100 MG/5ML IV SOSY
PREFILLED_SYRINGE | INTRAVENOUS | Status: DC | PRN
  Administered 2017-12-10: 75 mg via INTRAVENOUS

## 2017-12-10 MED ORDER — SODIUM CHLORIDE 0.9 % IV SOLN
INTRAVENOUS | Status: DC | PRN
  Administered 2017-12-10: 500 mL

## 2017-12-10 MED ORDER — LIDOCAINE HCL (PF) 1 % IJ SOLN
INTRAMUSCULAR | Status: AC
  Filled 2017-12-10: qty 60

## 2017-12-10 MED ORDER — LIDOCAINE HCL (CARDIAC) PF 100 MG/5ML IV SOSY
PREFILLED_SYRINGE | INTRAVENOUS | Status: AC
Start: 2017-12-10 — End: ?
  Filled 2017-12-10: qty 5

## 2017-12-10 MED ORDER — BUPIVACAINE-EPINEPHRINE 0.25% -1:200000 IJ SOLN
INTRAMUSCULAR | Status: DC | PRN
  Administered 2017-12-10: 11 mL

## 2017-12-10 MED ORDER — FAMOTIDINE 20 MG PO TABS
ORAL_TABLET | ORAL | Status: AC
  Filled 2017-12-10: qty 1

## 2017-12-10 MED ORDER — SUCCINYLCHOLINE CHLORIDE 200 MG/10ML IV SOSY
PREFILLED_SYRINGE | INTRAVENOUS | Status: AC
  Filled 2017-12-10: qty 10

## 2017-12-10 MED ORDER — PHENYLEPHRINE 40 MCG/ML (10ML) SYRINGE FOR IV PUSH (FOR BLOOD PRESSURE SUPPORT)
PREFILLED_SYRINGE | INTRAVENOUS | Status: AC
  Filled 2017-12-10: qty 10

## 2017-12-10 MED ORDER — SODIUM CHLORIDE 0.9 % IV SOLN
250.0000 mL | INTRAVENOUS | Status: DC | PRN
Start: 2017-12-10 — End: 2017-12-10

## 2017-12-10 MED ORDER — ACETAMINOPHEN 500 MG PO TABS
ORAL_TABLET | ORAL | Status: AC
  Filled 2017-12-10: qty 2

## 2017-12-10 MED ORDER — FAMOTIDINE 20 MG PO TABS
20.0000 mg | ORAL_TABLET | Freq: Once | ORAL | Status: AC
  Administered 2017-12-10: 20 mg via ORAL

## 2017-12-10 MED ORDER — ACETAMINOPHEN 500 MG PO TABS
1000.0000 mg | ORAL_TABLET | Freq: Once | ORAL | Status: AC
  Administered 2017-12-10: 1000 mg via ORAL

## 2017-12-10 MED ORDER — OXYCODONE HCL 5 MG/5ML PO SOLN: 5.0000 mg | Freq: Once | ORAL | Status: DC | PRN

## 2017-12-10 MED ORDER — DEXAMETHASONE SODIUM PHOSPHATE 10 MG/ML IJ SOLN
INTRAMUSCULAR | Status: AC
  Filled 2017-12-10: qty 1

## 2017-12-10 MED ORDER — EPINEPHRINE 30 MG/30ML IJ SOLN
INTRAMUSCULAR | Status: AC
  Filled 2017-12-10: qty 1

## 2017-12-10 MED ORDER — LIDOCAINE HCL 2 % IJ SOLN
INTRAMUSCULAR | Status: AC
  Filled 2017-12-10: qty 40

## 2017-12-10 MED ORDER — OXYCODONE HCL 5 MG PO TABS: 5.0000 mg | ORAL_TABLET | Freq: Once | ORAL | Status: DC | PRN

## 2017-12-10 MED ORDER — LIDOCAINE-EPINEPHRINE 1 %-1:100000 IJ SOLN
INTRAMUSCULAR | Status: AC
  Filled 2017-12-10: qty 1

## 2017-12-10 SURGICAL SUPPLY — 70 items
BAG DECANTER FOR FLEXI CONT (MISCELLANEOUS) ×3 IMPLANT
BINDER BREAST LRG (GAUZE/BANDAGES/DRESSINGS) IMPLANT
BINDER BREAST MEDIUM (GAUZE/BANDAGES/DRESSINGS) ×3 IMPLANT
BINDER BREAST XLRG (GAUZE/BANDAGES/DRESSINGS) IMPLANT
BINDER BREAST XXLRG (GAUZE/BANDAGES/DRESSINGS) IMPLANT
BIOPATCH RED 1 DISK 7.0 (GAUZE/BANDAGES/DRESSINGS) IMPLANT
BIOPATCH RED 1IN DISK 7.0MM (GAUZE/BANDAGES/DRESSINGS)
BLADE SURG 15 STRL LF DISP TIS (BLADE) ×2 IMPLANT
BLADE SURG 15 STRL SS (BLADE) ×4
BNDG GAUZE ELAST 4 BULKY (GAUZE/BANDAGES/DRESSINGS) IMPLANT
CANISTER SUCT 1200ML W/VALVE (MISCELLANEOUS) ×3 IMPLANT
CHLORAPREP W/TINT 26ML (MISCELLANEOUS) ×3 IMPLANT
COVER BACK TABLE 60X90IN (DRAPES) ×3 IMPLANT
COVER MAYO STAND STRL (DRAPES) ×3 IMPLANT
DECANTER SPIKE VIAL GLASS SM (MISCELLANEOUS) IMPLANT
DERMABOND ADVANCED (GAUZE/BANDAGES/DRESSINGS) ×4
DERMABOND ADVANCED .7 DNX12 (GAUZE/BANDAGES/DRESSINGS) ×2 IMPLANT
DRAIN CHANNEL 19F RND (DRAIN) IMPLANT
DRAPE LAPAROSCOPIC ABDOMINAL (DRAPES) ×3 IMPLANT
DRSG PAD ABDOMINAL 8X10 ST (GAUZE/BANDAGES/DRESSINGS) ×6 IMPLANT
ELECT BLADE 4.0 EZ CLEAN MEGAD (MISCELLANEOUS) ×3
ELECT COATED BLADE 2.86 ST (ELECTRODE) ×3 IMPLANT
ELECT REM PT RETURN 9FT ADLT (ELECTROSURGICAL) ×3
ELECTRODE BLDE 4.0 EZ CLN MEGD (MISCELLANEOUS) ×1 IMPLANT
ELECTRODE REM PT RTRN 9FT ADLT (ELECTROSURGICAL) ×1 IMPLANT
EVACUATOR SILICONE 100CC (DRAIN) IMPLANT
GAUZE SPONGE 4X4 12PLY STRL LF (GAUZE/BANDAGES/DRESSINGS) IMPLANT
GLOVE BIO SURGEON STRL SZ 6.5 (GLOVE) ×6 IMPLANT
GLOVE BIO SURGEON STRL SZ7 (GLOVE) ×3 IMPLANT
GLOVE BIO SURGEONS STRL SZ 6.5 (GLOVE) ×3
GOWN STRL REUS W/ TWL LRG LVL3 (GOWN DISPOSABLE) ×1 IMPLANT
GOWN STRL REUS W/ TWL XL LVL3 (GOWN DISPOSABLE) ×1 IMPLANT
GOWN STRL REUS W/TWL LRG LVL3 (GOWN DISPOSABLE) ×2
GOWN STRL REUS W/TWL XL LVL3 (GOWN DISPOSABLE) ×2
IMPL BREAST P4.3-5XMDRT 300 (Breast) ×2 IMPLANT
IMPL BRST P4.3-5XMDRT 300CC (Breast) ×2 IMPLANT
IMPLANT BREAST SALINE 300CC (Breast) ×4 IMPLANT
IV NS 1000ML (IV SOLUTION) ×2
IV NS 1000ML BAXH (IV SOLUTION) ×1 IMPLANT
KIT FILL SYSTEM UNIVERSAL (SET/KITS/TRAYS/PACK) IMPLANT
NDL SAFETY ECLIPSE 18X1.5 (NEEDLE) ×1 IMPLANT
NEEDLE HYPO 18GX1.5 SHARP (NEEDLE) ×2
NEEDLE HYPO 25X1 1.5 SAFETY (NEEDLE) ×3 IMPLANT
PACK BASIN DAY SURGERY FS (CUSTOM PROCEDURE TRAY) ×3 IMPLANT
PENCIL BUTTON HOLSTER BLD 10FT (ELECTRODE) ×3 IMPLANT
PIN SAFETY STERILE (MISCELLANEOUS) IMPLANT
SIZER BREAST SAL SGL USE 300CC (SIZER) ×3
SIZER BRST SAL SGL USE 300CC (SIZER) ×1 IMPLANT
SLEEVE SCD COMPRESS KNEE MED (MISCELLANEOUS) ×3 IMPLANT
SPONGE LAP 18X18 RF (DISPOSABLE) ×6 IMPLANT
SUT MNCRL AB 3-0 PS2 18 (SUTURE) IMPLANT
SUT MNCRL AB 4-0 PS2 18 (SUTURE) ×9 IMPLANT
SUT MON AB 3-0 SH 27 (SUTURE) ×6
SUT MON AB 3-0 SH27 (SUTURE) ×3 IMPLANT
SUT MON AB 5-0 PS2 18 (SUTURE) ×6 IMPLANT
SUT PDS 3-0 CT2 (SUTURE)
SUT PDS AB 2-0 CT2 27 (SUTURE) IMPLANT
SUT PDS II 3-0 CT2 27 ABS (SUTURE) IMPLANT
SUT SILK 3 0 PS 1 (SUTURE) IMPLANT
SUT VIC AB 3-0 SH 27 (SUTURE)
SUT VIC AB 3-0 SH 27X BRD (SUTURE) IMPLANT
SUT VICRYL 4-0 PS2 18IN ABS (SUTURE) IMPLANT
SYR 50ML LL SCALE MARK (SYRINGE) ×3 IMPLANT
SYR BULB IRRIGATION 50ML (SYRINGE) ×3 IMPLANT
SYR CONTROL 10ML LL (SYRINGE) ×3 IMPLANT
TOWEL GREEN STERILE FF (TOWEL DISPOSABLE) ×6 IMPLANT
TUBE CONNECTING 20'X1/4 (TUBING) ×1
TUBE CONNECTING 20X1/4 (TUBING) ×2 IMPLANT
UNDERPAD 30X30 (UNDERPADS AND DIAPERS) ×6 IMPLANT
YANKAUER SUCT BULB TIP NO VENT (SUCTIONS) ×3 IMPLANT

## 2017-12-10 NOTE — Discharge Instructions (Signed)
No heavy lifting May shower tomorrow Continue binder or sports bra    Tylenol 1,000mg  given today at 0730.     Post Anesthesia Home Care Instructions  Activity: Get plenty of rest for the remainder of the day. A responsible individual must stay with you for 24 hours following the procedure.  For the next 24 hours, DO NOT: -Drive a car -Paediatric nurse -Drink alcoholic beverages -Take any medication unless instructed by your physician -Make any legal decisions or sign important papers.  Meals: Start with liquid foods such as gelatin or soup. Progress to regular foods as tolerated. Avoid greasy, spicy, heavy foods. If nausea and/or vomiting occur, drink only clear liquids until the nausea and/or vomiting subsides. Call your physician if vomiting continues.  Special Instructions/Symptoms: Your throat may feel dry or sore from the anesthesia or the breathing tube placed in your throat during surgery. If this causes discomfort, gargle with warm salt water. The discomfort should disappear within 24 hours.  If you had a scopolamine patch placed behind your ear for the management of post- operative nausea and/or vomiting:  1. The medication in the patch is effective for 72 hours, after which it should be removed.  Wrap patch in a tissue and discard in the trash. Wash hands thoroughly with soap and water. 2. You may remove the patch earlier than 72 hours if you experience unpleasant side effects which may include dry mouth, dizziness or visual disturbances. 3. Avoid touching the patch. Wash your hands with soap and water after contact with the patch.

## 2017-12-10 NOTE — Transfer of Care (Signed)
Immediate Anesthesia Transfer of Care Note  Patient: Kelsey Klein  Procedure(s) Performed: REMOVAL OF TISSUE EXPANDERS AND PLACEMENT OF IMPLANTS (Bilateral Breast)  Patient Location: PACU  Anesthesia Type:General  Level of Consciousness: sedated  Airway & Oxygen Therapy: Patient Spontanous Breathing and Patient connected to face mask oxygen  Post-op Assessment: Report given to RN and Post -op Vital signs reviewed and stable  Post vital signs: Reviewed and stable  Last Vitals:  Vitals Value Taken Time  BP 108/75 12/10/2017  9:52 AM  Temp    Pulse 65 12/10/2017  9:53 AM  Resp 13 12/10/2017  9:53 AM  SpO2 100 % 12/10/2017  9:53 AM  Vitals shown include unvalidated device data.  Last Pain:  Vitals:   12/10/17 0650  TempSrc: Oral         Complications: No apparent anesthesia complications

## 2017-12-10 NOTE — Anesthesia Preprocedure Evaluation (Addendum)
Anesthesia Evaluation  Patient identified by MRN, date of birth, ID band Patient awake    Reviewed: Allergy & Precautions, NPO status , Patient's Chart, lab work & pertinent test results  Airway Mallampati: II  TM Distance: >3 FB Neck ROM: Full    Dental no notable dental hx.    Pulmonary neg pulmonary ROS,    Pulmonary exam normal breath sounds clear to auscultation       Cardiovascular negative cardio ROS Normal cardiovascular exam Rhythm:Regular Rate:Normal     Neuro/Psych negative neurological ROS  negative psych ROS   GI/Hepatic negative GI ROS, Neg liver ROS,   Endo/Other  negative endocrine ROS  Renal/GU negative Renal ROS     Musculoskeletal negative musculoskeletal ROS (+)   Abdominal   Peds  Hematology negative hematology ROS (+)   Anesthesia Other Findings Breast cancer  Reproductive/Obstetrics hcg negative                            Anesthesia Physical Anesthesia Plan  ASA: II  Anesthesia Plan: General   Post-op Pain Management:    Induction: Intravenous  PONV Risk Score and Plan: 3 and Ondansetron, Dexamethasone, Midazolam, Treatment may vary due to age or medical condition and Scopolamine patch - Pre-op  Airway Management Planned: LMA  Additional Equipment:   Intra-op Plan:   Post-operative Plan: Extubation in OR  Informed Consent: I have reviewed the patients History and Physical, chart, labs and discussed the procedure including the risks, benefits and alternatives for the proposed anesthesia with the patient or authorized representative who has indicated his/her understanding and acceptance.   Dental advisory given  Plan Discussed with: CRNA  Anesthesia Plan Comments:        Anesthesia Quick Evaluation

## 2017-12-10 NOTE — Anesthesia Procedure Notes (Signed)
Procedure Name: LMA Insertion Date/Time: 12/10/2017 8:40 AM Performed by: Willa Frater, CRNA Pre-anesthesia Checklist: Patient identified, Emergency Drugs available, Suction available and Patient being monitored Patient Re-evaluated:Patient Re-evaluated prior to induction Oxygen Delivery Method: Circle system utilized Preoxygenation: Pre-oxygenation with 100% oxygen Induction Type: IV induction Ventilation: Mask ventilation without difficulty LMA: LMA inserted LMA Size: 4.0 Number of attempts: 1 Airway Equipment and Method: Bite block Placement Confirmation: positive ETCO2 Tube secured with: Tape Dental Injury: Teeth and Oropharynx as per pre-operative assessment

## 2017-12-10 NOTE — Op Note (Signed)
Op report Bilateral Exchange   DATE OF OPERATION: 12/10/2017  LOCATION: [place]  SURGICAL DIVISION: Plastic Surgery  PREOPERATIVE DIAGNOSES:  1. History of breast cancer.  2. Acquired absence of bilateral breast.   POSTOPERATIVE DIAGNOSES:  1. History of breast cancer.  2. Acquired absence of bilateral breast.   PROCEDURE:  1. Bilateral exchange of tissue expanders for implants.  2. Bilateral capsulotomies for implant respositioning.  SURGEON: Ivori Storr Sanger Audreyanna Butkiewicz, DO  ANESTHESIA:  General.   COMPLICATIONS: None.   IMPLANTS: Left - Mentor Smooth Round Moderate Plus Profile Saline 300cc. Ref #[id number].  Serial Number [id number], 350 cc of saline placed Right - Mentor Smooth Round Moderate Plus Profile Saline 300cc. Ref #[id number].  Serial Number [id number], 350 cc of saline placed  INDICATIONS FOR PROCEDURE:  The patient, Kelsey Klein, is a 47 y.o. female born on [birth year], is here for treatment after bilateral mastectomies.  She had tissue expanders placed at the time of mastectomies. She now presents for exchange of her expanders for saline implants.  She requires capsulotomies to better position the implants. MRN: [id number]  CONSENT:  Informed consent was obtained directly from the patient. Risks, benefits and alternatives were fully discussed. Specific risks including but not limited to bleeding, infection, hematoma, seroma, scarring, pain, implant infection, implant extrusion, capsular contracture, asymmetry, wound healing problems, and need for further surgery were all discussed. The patient did have an ample opportunity to have her questions answered to her satisfaction.   DESCRIPTION OF PROCEDURE:  The patient was taken to the operating room. SCDs were placed and IV antibiotics were given. The patient's chest was prepped and draped in a sterile fashion. A time out was performed and the implants to be used were identified.    On the  right breast: One percent Lidocaine with epinephrine was used to infiltrate at the incision site. The old mastectomy scar was excised.  The mastectomy flaps from the superior and inferior flaps were raised over the pectoralis major muscle for several centimeters to minimize tension for the closure. The pectoralis was split inferior to the skin incision to expose and remove the tissue expander.  Inspection of the pocket showed a normal healthy capsule and good integration of the biologic matrix.  The pocket was irrigated with antibiotic solution.  Circumferential capsulotomies were performed to allow for breast pocket expansion.  Measurements were made and a sizer used to confirm adequate pocket size for the implant dimensions.  Hemostasis was ensured with electrocautery. New gloves were placed. The implant was soaked in antibiotic solution and then placed in the pocket and oriented appropriately. The implant was filled with injectable saline and all the air was removed.  The tab was connected into the port site.  The pectoralis major muscle and capsule on the anterior surface were re-closed with a 3-0 Monocryl suture. The remaining skin was closed with 4-0 Monocryl deep dermal and 5-0 Monocryl subcuticular stitches.   On the left breast: The old mastectomy scar was excised.  The mastectomy flaps from the superior and inferior flaps were raised over the pectoralis major muscle for several centimeters to minimize tension for the closure. The pectoralis was split inferior to the skin incision to expose and remove the tissue expander.  Inspection of the pocket showed a normal healthy capsule and good integration of the biologic matrix.   Circumferential capsulotomies were performed to allow for breast pocket expansion.  Measurements were made and a sizer utilized to confirm adequate pocket size for  the implant dimensions.  Hemostasis was ensured with the electrocautery.  New gloves were applied. The implant was  soaked in antibiotic solution and placed in the pocket and oriented appropriately. The implant was filled with injectable saline and all the air was removed. The tab was connected into the port site. The pectoralis major muscle and capsule on the anterior surface were re-closed with a 3-0 Monocryl suture. The remaining skin was closed with 4-0 Monocryl deep dermal and 5-0 Monocryl subcuticular stitches.  Dermabond was applied to the incision site. A breast binder and ABDs were placed.  The patient was allowed to wake from anesthesia and taken to the recovery room in satisfactory condition.

## 2017-12-11 ENCOUNTER — Encounter (HOSPITAL_BASED_OUTPATIENT_CLINIC_OR_DEPARTMENT_OTHER): Payer: Self-pay | Admitting: Plastic Surgery

## 2017-12-11 NOTE — Anesthesia Postprocedure Evaluation (Signed)
Anesthesia Post Note  Patient: Kelsey Klein  Procedure(s) Performed: REMOVAL OF TISSUE EXPANDERS AND PLACEMENT OF IMPLANTS (Bilateral Breast)     Patient location during evaluation: PACU Anesthesia Type: General Level of consciousness: awake and alert Pain management: pain level controlled Vital Signs Assessment: post-procedure vital signs reviewed and stable Respiratory status: spontaneous breathing, nonlabored ventilation, respiratory function stable and patient connected to nasal cannula oxygen Cardiovascular status: blood pressure returned to baseline and stable Postop Assessment: no apparent nausea or vomiting Anesthetic complications: no    Last Vitals:  Vitals:   12/10/17 1030 12/10/17 1115  BP: 126/89 (!) 127/93  Pulse: 72 72  Resp: 19 18  Temp:  36.5 C  SpO2: 98% 100%    Last Pain:  Vitals:   12/10/17 1115  TempSrc:   PainSc: 0-No pain                 Ryan P Ellender

## 2017-12-28 NOTE — Progress Notes (Signed)
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  INTERVAL HISTORY: Patient returns to clinic today for routine six-month follow-up.  She has now completed her bilateral mastectomy with reconstruction.  She continues to tolerate tamoxifen without significant side effects.  She has no neurologic complaints.  She denies any recent fevers or illnesses.  She has a good appetite and denies weight loss.  She has no chest pain or shortness of breath.  She denies any nausea, vomiting, constipation, or diarrhea.  She has no urinary complaints.  Patient offers no specific complaints today.  REVIEW OF SYSTEMS:   Review of Systems  Constitutional: Negative.  Negative for fever, malaise/fatigue and weight loss.  Respiratory: Negative.  Negative for cough and shortness of breath.   Cardiovascular: Negative.  Negative for chest pain and leg swelling.  Gastrointestinal: Negative.  Negative for abdominal pain and constipation.  Genitourinary: Negative.  Negative for dysuria.  Musculoskeletal: Negative.  Negative for back pain.  Skin: Negative.  Negative for rash.  Neurological: Negative.  Negative for focal weakness, weakness and headaches.  Psychiatric/Behavioral: Negative.  The patient is not nervous/anxious.     As per HPI. Otherwise, a complete review of systems is negative.  PAST MEDICAL HISTORY: Past Medical History:  Diagnosis Date  . Breast cancer, stage 1, estrogen receptor negative, right ([place]) 08/19/2015    18 mm; pT1c  pN0 (0/6nodes positive). ER/PR +; Her 2 neu not overexpressed  . Family history of breast cancer     PAST SURGICAL  HISTORY: Past Surgical History:  Procedure Laterality Date  . 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 BREAST BIOPSY Left 05/27/2017   Affirm Bx- DCIS  . BREAST RECONSTRUCTION WITH PLACEMENT OF TISSUE EXPANDER AND FLEX HD (ACELLULAR HYDRATED DERMIS) Bilateral 08/18/2017   Procedure: BREAST RECONSTRUCTION WITH PLACEMENT OF TISSUE EXPANDER AND FLEX HD (ACELLULAR HYDRATED DERMIS);  Surgeon: Wallace Going, DO;  Location: [facility];  Service: Plastics;  Laterality: Bilateral;  . MASTECTOMY W/ SENTINEL NODE BIOPSY Bilateral 08/18/2017   Procedure: MASTECTOMY WITH SENTINEL LYMPH NODE BIOPSY,FROZEN SECTION;  Surgeon: Robert Bellow, MD;  Location: [facility];  Service: General;  Laterality: Bilateral;  . REMOVAL OF TISSUE EXPANDER AND PLACEMENT OF IMPLANT Bilateral 12/10/2017   Procedure: REMOVAL OF TISSUE EXPANDERS AND PLACEMENT OF IMPLANTS;  Surgeon: Wallace Going, DO;  Location: [place];  Service: Plastics;  Laterality: Bilateral;    FAMILY HISTORY: Family History  Problem Relation Age of Onset  . Arthritis Mother   . Cirrhosis Father        deceased 32; adopted  . Hypertension Father   . Hypertension Sister   . Alzheimer's disease Maternal Grandmother   . Breast cancer Paternal Aunt        age at dx unknown  . Breast cancer Paternal Aunt        age at dx unknown    ADVANCED DIRECTIVES (Y/N):  N  HEALTH MAINTENANCE: Social History   Tobacco Use  . Smoking status: Never Smoker  . Smokeless tobacco: Never Used  Substance Use Topics  . Alcohol use: Not Currently  . Drug use: No  Colonoscopy:  PAP:  Bone density:  Lipid panel:  No Known Allergies  Current Outpatient Medications  Medication Sig Dispense Refill  . Collagen Hydrolysate POWD by Does not apply route daily. Pt takes collagen protein powder     . oxycodone (OXY-IR) 5 MG capsule Take 5 mg by mouth every 4 (four) hours as needed.     . tamoxifen (NOLVADEX) 20 MG tablet Take 1 tablet (20 mg total) by mouth daily. 90 tablet 2   No current facility-administered medications for this visit.     OBJECTIVE: There were no vitals filed for this visit.   Body mass index is 24.71 kg/m.    ECOG FS:0 - Asymptomatic  General: Well-developed, well-nourished, no acute distress. Eyes: Pink conjunctiva, anicteric sclera. HEENT: Normocephalic, moist mucous membranes. Breast: Bilateral mastectomy with reconstruction.  Exam deferred today. Lungs: Clear to auscultation bilaterally. Heart: Regular rate and rhythm. No rubs, murmurs, or gallops. Abdomen: Soft, nontender, nondistended. No organomegaly noted, normoactive bowel sounds. Musculoskeletal: No edema, cyanosis, or clubbing. Neuro: Alert, answering all questions appropriately. Cranial nerves grossly intact. Skin: No rashes or petechiae noted. Psych: Normal affect.  LAB RESULTS:  No results found for: NA, K, CL, CO2, GLUCOSE, BUN, CREATININE, CALCIUM, PROT, ALBUMIN, AST, ALT, ALKPHOS, BILITOT, GFRNONAA, GFRAA  No results found for: WBC, NEUTROABS, HGB, HCT, MCV, PLT   STUDIES: No results found.  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  Oncotype DX score 13, low risk of recurrence.  PLAN:    1.  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  Because of this she did not require adjuvant XRT.  Because of her low risk Oncotype score, she did not require chemotherapy.  Continue tamoxifen for a total of 5 years completing in April 2024.  Return to clinic in 6 months for routine evaluation.   2.  Breast reconstruction: Continue treatments and follow-up with plastic surgery as indicated.  I spent a total of 20 minutes face-to-face with the patient of which greater than 50% of the visit was spent  in counseling and coordination of care as detailed above.   Patient expressed understanding and was in agreement with this plan. She also understands that She can call clinic at any time with any questions, concerns, or complaints.   Cancer Staging Primary cancer of lower-outer quadrant of left breast [facility]) Staging form: Breast, AJCC 8th Edition - Clinical stage from 05/31/2017: Stage IB (cT2, cN0, cM0, G1, ER: Positive, PR: Positive, HER2: Negative) - Signed by Lloyd Huger, MD on 05/31/2017 - Pathologic stage from 09/26/2017: Stage IA (pT1c, pN0, cM0, G2, ER+, PR+, HER2-, Oncotype DX score: 13) - Signed by Lloyd Huger, MD on 09/26/2017   Lloyd Huger, MD   01/04/2018 10:45 AM

## 2017-12-29 ENCOUNTER — Ambulatory Visit: Payer: Medicaid Other | Admitting: Oncology

## 2017-12-31 ENCOUNTER — Inpatient Hospital Stay: Payer: Medicaid Other | Attending: Oncology | Admitting: Oncology

## 2017-12-31 ENCOUNTER — Encounter: Payer: Self-pay | Admitting: Oncology

## 2017-12-31 VITALS — Ht 63.0 in | Wt 139.5 lb

## 2017-12-31 DIAGNOSIS — Z79810 Long term (current) use of selective estrogen receptor modulators (SERMs): Secondary | ICD-10-CM | POA: Insufficient documentation

## 2017-12-31 DIAGNOSIS — C50512 Malignant neoplasm of lower-outer quadrant of left female breast: Secondary | ICD-10-CM | POA: Diagnosis not present

## 2017-12-31 DIAGNOSIS — Z803 Family history of malignant neoplasm of breast: Secondary | ICD-10-CM | POA: Insufficient documentation

## 2017-12-31 DIAGNOSIS — Z9013 Acquired absence of bilateral breasts and nipples: Secondary | ICD-10-CM | POA: Diagnosis not present

## 2017-12-31 DIAGNOSIS — Z9889 Other specified postprocedural states: Secondary | ICD-10-CM | POA: Insufficient documentation

## 2017-12-31 DIAGNOSIS — Z170 Estrogen receptor positive status [ER+]: Secondary | ICD-10-CM | POA: Diagnosis not present

## 2017-12-31 MED ORDER — TAMOXIFEN CITRATE 20 MG PO TABS: 20.0000 mg | ORAL_TABLET | Freq: Every day | ORAL | 2 refills | Status: DC

## 2017-12-31 NOTE — Progress Notes (Signed)
Taking tamoxifen without side effects

## 2018-01-27 ENCOUNTER — Encounter: Payer: Self-pay | Admitting: Oncology

## 2018-03-18 ENCOUNTER — Ambulatory Visit (INDEPENDENT_AMBULATORY_CARE_PROVIDER_SITE_OTHER): Payer: Medicaid Other | Admitting: Plastic Surgery

## 2018-03-18 ENCOUNTER — Encounter: Payer: Self-pay | Admitting: Plastic Surgery

## 2018-03-18 VITALS — BP 118/70 | HR 80 | Resp 12 | Ht 63.0 in | Wt 142.0 lb

## 2018-03-18 DIAGNOSIS — N651 Disproportion of reconstructed breast: Secondary | ICD-10-CM | POA: Diagnosis not present

## 2018-03-18 DIAGNOSIS — C50919 Malignant neoplasm of unspecified site of unspecified female breast: Secondary | ICD-10-CM

## 2018-03-18 DIAGNOSIS — Z803 Family history of malignant neoplasm of breast: Secondary | ICD-10-CM

## 2018-03-18 DIAGNOSIS — C50512 Malignant neoplasm of lower-outer quadrant of left female breast: Secondary | ICD-10-CM | POA: Diagnosis not present

## 2018-03-18 NOTE — Progress Notes (Signed)
   Subjective:    Patient ID: Kelsey Klein, female    DOB: [birth year], 47 y.o.   MRN: [id number]  The patient is a 47 yrs old bf here for follow up on her breast reconstruction.  Her silicone implants are in place.  Good shape and no areas of concern.  She has Mentor smooth round moderate plus 300 cc implants. She has some firmness and thickening on the medial aspect of the left breast.  The right is softer.  She has some volume loss superior medially on both breasts.     History: Diagnosed with Invasive lower outer quadrant LEFT breast cancer, clinical stage Ib, ER / PR positive, HER-2 negative. She noted a lump in the breast ~ 4 months ago and it was getting larger. She underwent a mammogram, ultrasound and then biopsy. She is 5 feet 4 inches tall, weight is 142 pounds and preop bra 34 B. She has one child and did not breast feed.   Review of Systems  Constitutional: Negative for activity change and appetite change.  HENT: Negative.   Eyes: Negative.   Respiratory: Negative.  Negative for chest tightness and shortness of breath.   Cardiovascular: Negative.   Gastrointestinal: Negative.   Endocrine: Negative.   Genitourinary: Negative.   Musculoskeletal: Negative.   Skin: Negative.  Negative for color change and wound.  Neurological: Negative.   Hematological: Negative.   Psychiatric/Behavioral: Negative.   History of liposuction.  Current Outpatient Medications:  .  Collagen Hydrolysate POWD, by Does not apply route daily. Pt takes collagen protein powder , Disp: , Rfl:  .  oxycodone (OXY-IR) 5 MG capsule, Take 5 mg by mouth every 4 (four) hours as needed., Disp: , Rfl:  .  tamoxifen (NOLVADEX) 20 MG tablet, Take 1 tablet (20 mg total) by mouth daily., Disp: 90 tablet, Rfl: 2     Objective:   Physical Exam  Constitutional: She is oriented to person, place, and time. She appears well-developed and well-nourished.  HENT:  Head: Normocephalic and atraumatic.  Eyes: Pupils are  equal, round, and reactive to light. EOM are normal.  Cardiovascular: Normal rate.  Pulmonary/Chest: No respiratory distress. She exhibits no tenderness.  Neurological: She is alert and oriented to person, place, and time.  Skin: Skin is warm.  Psychiatric: She has a normal mood and affect. Her behavior is normal. Judgment and thought content normal.   Vitals:   03/18/18 1152  BP: 118/70  Pulse: 80  Resp: 12  SpO2: 98%  Weight: 142 lb (64.4 kg)  Height: _0  (1.6 m)        Assessment & Plan:  Malignant neoplasm of female breast, unspecified estrogen receptor status, unspecified laterality, unspecified site of breast ([place])  Family history of breast cancer  Primary cancer of lower-outer quadrant of left breast ([place])  Breast asymmetry following reconstructive surgery  Plan for fat grafting from abdomen to upper inner breasts bilaterally.

## 2018-04-08 ENCOUNTER — Telehealth: Payer: Self-pay | Admitting: Plastic Surgery

## 2018-04-08 NOTE — Telephone Encounter (Signed)
Calling about when procedure would take place. Patient had a post op 10/2.

## 2018-04-14 ENCOUNTER — Telehealth: Payer: Self-pay | Admitting: Plastic Surgery

## 2018-04-14 NOTE — Telephone Encounter (Signed)
Patient called to inquire about surgery date; wants possibly before the end of November.

## 2018-05-11 ENCOUNTER — Other Ambulatory Visit: Payer: Self-pay

## 2018-05-11 ENCOUNTER — Encounter (HOSPITAL_BASED_OUTPATIENT_CLINIC_OR_DEPARTMENT_OTHER): Payer: Self-pay | Admitting: *Deleted

## 2018-05-12 ENCOUNTER — Encounter: Payer: Self-pay | Admitting: Plastic Surgery

## 2018-05-12 ENCOUNTER — Ambulatory Visit (INDEPENDENT_AMBULATORY_CARE_PROVIDER_SITE_OTHER): Payer: Self-pay | Admitting: Plastic Surgery

## 2018-05-12 VITALS — BP 124/78 | HR 74 | Resp 12 | Ht 63.0 in | Wt 145.0 lb

## 2018-05-12 DIAGNOSIS — C50512 Malignant neoplasm of lower-outer quadrant of left female breast: Secondary | ICD-10-CM

## 2018-05-12 DIAGNOSIS — N651 Disproportion of reconstructed breast: Secondary | ICD-10-CM

## 2018-05-12 DIAGNOSIS — C50919 Malignant neoplasm of unspecified site of unspecified female breast: Secondary | ICD-10-CM

## 2018-05-12 NOTE — H&P (View-Only) (Signed)
Patient ID: Kelsey Klein, female    DOB: 04-Feb-1971, 47 y.o.   MRN: [id number]   Chief Complaint  Patient presents with  . Breast Problem    The patient is a 47 year old bf here for follow-up on her breast reconstruction with silicone implants.  She has Mentor smooth round moderate 300 cc implants in place.  At her contour and symmetry she is pleased with the results and understood the limitation of size.  She has some superior pole volume loss especially medially.  She is interested in having this revised.  I think that is reasonable and have arranged for fat filling of the area for better contour and symmetry.  She has not had any recent illnesses and has been encouraged to increase her protein intake.   Review of Systems  Constitutional: Negative.  Negative for activity change.  HENT: Negative.   Eyes: Negative.   Respiratory: Negative.   Cardiovascular: Negative.  Negative for leg swelling.  Gastrointestinal: Negative.  Negative for abdominal distention.  Endocrine: Negative.   Genitourinary: Negative.   Musculoskeletal: Negative.  Negative for gait problem.  Neurological: Negative.   Psychiatric/Behavioral: Negative.     Past Medical History:  Diagnosis Date  . Breast cancer, stage 1, estrogen receptor negative, right ([place]) 08/19/2015    18 mm; pT1c  pN0 (0/6nodes positive). ER/PR +; Her 2 neu not overexpressed  . Family history of breast cancer     Past Surgical History:  Procedure Laterality Date  . 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 BREAST BIOPSY Left 05/27/2017   Affirm Bx- DCIS  . BREAST RECONSTRUCTION WITH PLACEMENT OF TISSUE EXPANDER AND FLEX HD (ACELLULAR HYDRATED DERMIS) Bilateral 08/18/2017   Procedure: BREAST RECONSTRUCTION WITH PLACEMENT OF TISSUE EXPANDER AND FLEX HD (ACELLULAR HYDRATED DERMIS);  Surgeon: Wallace Going, DO;  Location: [facility];  Service: Plastics;  Laterality: Bilateral;    . MASTECTOMY W/ SENTINEL NODE BIOPSY Bilateral 08/18/2017   Procedure: MASTECTOMY WITH SENTINEL LYMPH NODE BIOPSY,FROZEN SECTION;  Surgeon: Robert Bellow, MD;  Location: [facility];  Service: General;  Laterality: Bilateral;  . REMOVAL OF TISSUE EXPANDER AND PLACEMENT OF IMPLANT Bilateral 12/10/2017   Procedure: REMOVAL OF TISSUE EXPANDERS AND PLACEMENT OF IMPLANTS;  Surgeon: Wallace Going, DO;  Location: [place];  Service: Plastics;  Laterality: Bilateral;      Current Outpatient Medications:  .  Collagen Hydrolysate POWD, by Does not apply route daily. Pt takes collagen protein powder , Disp: , Rfl:  .  oxycodone (OXY-IR) 5 MG capsule, Take 5 mg by mouth every 4 (four) hours as needed., Disp: , Rfl:  .  tamoxifen (NOLVADEX) 20 MG tablet, Take 1 tablet (20 mg total) by mouth daily., Disp: 90 tablet, Rfl: 2   Objective:   There were no vitals filed for this visit.  Physical Exam  Constitutional: She is oriented to person, place, and time. She appears well-developed and well-nourished.  Eyes: Pupils are equal, round, and reactive to light. EOM are normal.  Cardiovascular: Normal rate.  Pulmonary/Chest: Effort normal.  Abdominal: Soft.  Neurological: She is alert and oriented to person, place, and time.  Skin: Skin is warm.  Psychiatric: She has a normal mood and affect. Her behavior is normal. Judgment and thought content normal.    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  The risks that can be encountered with and after liposuction were discussed and include the following but no limited to these:  Asymmetry, fluid accumulation, firmness  of the area, fat necrosis with death of fat tissue, bleeding, infection, delayed healing, anesthesia risks, skin sensation changes, injury to structures including nerves, blood vessels, and muscles which may be temporary or permanent, allergies to tape, suture materials and glues, blood products, topical preparations or injected agents, skin and contour irregularities, skin discoloration and swelling, deep vein thrombosis, cardiac and pulmonary complications, pain, which may persist, persistent pain, recurrence of the lesion, poor healing of the incision, possible need for revisional surgery or staged procedures. Thiere can also be persistent swelling, poor wound healing, rippling or loose skin, worsening of cellulite, swelling, and thermal burn or heat injury from ultrasound with the ultrasound-assisted lipoplasty technique. Any change in weight fluctuations can alter the outcome.  [place], DO

## 2018-05-12 NOTE — Progress Notes (Signed)
Patient ID: Kelsey Klein, female    DOB: 04-Feb-1971, 47 y.o.   MRN: [id number]   Chief Complaint  Patient presents with  . Breast Problem    The patient is a 47 year old bf here for follow-up on her breast reconstruction with silicone implants.  She has [place] smooth round moderate 300 cc implants in place.  At her contour and symmetry she is pleased with the results and understood the limitation of size.  She has some superior pole volume loss especially medially.  She is interested in having this revised.  I think that is reasonable and have arranged for fat filling of the area for better contour and symmetry.  She has not had any recent illnesses and has been encouraged to increase her protein intake.   Review of Systems  Constitutional: Negative.  Negative for activity change.  HENT: Negative.   Eyes: Negative.   Respiratory: Negative.   Cardiovascular: Negative.  Negative for leg swelling.  Gastrointestinal: Negative.  Negative for abdominal distention.  Endocrine: Negative.   Genitourinary: Negative.   Musculoskeletal: Negative.  Negative for gait problem.  Neurological: Negative.   Psychiatric/Behavioral: Negative.     Past Medical History:  Diagnosis Date  . Breast cancer, stage 1, estrogen receptor negative, right ([place]) 08/19/2015    18 mm; pT1c  pN0 (0/6nodes positive). ER/PR +; Her 2 neu not overexpressed  . Family history of breast cancer     Past Surgical History:  Procedure Laterality Date  . 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 BREAST BIOPSY Left 05/27/2017   Affirm Bx- DCIS  . BREAST RECONSTRUCTION WITH PLACEMENT OF TISSUE EXPANDER AND FLEX HD (ACELLULAR HYDRATED DERMIS) Bilateral 08/18/2017   Procedure: BREAST RECONSTRUCTION WITH PLACEMENT OF TISSUE EXPANDER AND FLEX HD (ACELLULAR HYDRATED DERMIS);  Surgeon: Wallace Going, DO;  Location: [facility];  Service: Plastics;  Laterality: Bilateral;    . MASTECTOMY W/ SENTINEL NODE BIOPSY Bilateral 08/18/2017   Procedure: MASTECTOMY WITH SENTINEL LYMPH NODE BIOPSY,FROZEN SECTION;  Surgeon: Robert Bellow, MD;  Location: [facility];  Service: General;  Laterality: Bilateral;  . REMOVAL OF TISSUE EXPANDER AND PLACEMENT OF IMPLANT Bilateral 12/10/2017   Procedure: REMOVAL OF TISSUE EXPANDERS AND PLACEMENT OF IMPLANTS;  Surgeon: Wallace Going, DO;  Location: [place];  Service: Plastics;  Laterality: Bilateral;      Current Outpatient Medications:  .  Collagen Hydrolysate POWD, by Does not apply route daily. Pt takes collagen protein powder , Disp: , Rfl:  .  oxycodone (OXY-IR) 5 MG capsule, Take 5 mg by mouth every 4 (four) hours as needed., Disp: , Rfl:  .  tamoxifen (NOLVADEX) 20 MG tablet, Take 1 tablet (20 mg total) by mouth daily., Disp: 90 tablet, Rfl: 2   Objective:   There were no vitals filed for this visit.  Physical Exam  Constitutional: She is oriented to person, place, and time. She appears well-developed and well-nourished.  Eyes: Pupils are equal, round, and reactive to light. EOM are normal.  Cardiovascular: Normal rate.  Pulmonary/Chest: Effort normal.  Abdominal: Soft.  Neurological: She is alert and oriented to person, place, and time.  Skin: Skin is warm.  Psychiatric: She has a normal mood and affect. Her behavior is normal. Judgment and thought content normal.    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  The risks that can be encountered with and after liposuction were discussed and include the following but no limited to these:  Asymmetry, fluid accumulation, firmness  of the area, fat necrosis with death of fat tissue, bleeding, infection, delayed healing, anesthesia risks, skin sensation changes, injury to structures including nerves, blood vessels, and muscles which may be temporary or permanent, allergies to tape, suture materials and glues, blood products, topical preparations or injected agents, skin and contour irregularities, skin discoloration and swelling, deep vein thrombosis, cardiac and pulmonary complications, pain, which may persist, persistent pain, recurrence of the lesion, poor healing of the incision, possible need for revisional surgery or staged procedures. Thiere can also be persistent swelling, poor wound healing, rippling or loose skin, worsening of cellulite, swelling, and thermal burn or heat injury from ultrasound with the ultrasound-assisted lipoplasty technique. Any change in weight fluctuations can alter the outcome.  [place], DO

## 2018-05-21 ENCOUNTER — Encounter (HOSPITAL_BASED_OUTPATIENT_CLINIC_OR_DEPARTMENT_OTHER)
Admission: RE | Disposition: A | Discharge status: Home / Self Care | Payer: Self-pay | Source: Ambulatory Visit | Attending: Plastic Surgery

## 2018-05-21 ENCOUNTER — Ambulatory Visit (HOSPITAL_BASED_OUTPATIENT_CLINIC_OR_DEPARTMENT_OTHER)
Admission: RE | Admit: 2018-05-21 | Discharge: 2018-05-21 | Disposition: A | Discharge status: Home / Self Care | Payer: Medicaid Other | Source: Ambulatory Visit | Attending: Plastic Surgery | Admitting: Plastic Surgery

## 2018-05-21 ENCOUNTER — Ambulatory Visit (HOSPITAL_BASED_OUTPATIENT_CLINIC_OR_DEPARTMENT_OTHER): Payer: Medicaid Other | Admitting: Anesthesiology

## 2018-05-21 ENCOUNTER — Encounter (HOSPITAL_BASED_OUTPATIENT_CLINIC_OR_DEPARTMENT_OTHER): Payer: Self-pay

## 2018-05-21 ENCOUNTER — Other Ambulatory Visit: Payer: Self-pay

## 2018-05-21 DIAGNOSIS — Z803 Family history of malignant neoplasm of breast: Secondary | ICD-10-CM | POA: Insufficient documentation

## 2018-05-21 DIAGNOSIS — Z9013 Acquired absence of bilateral breasts and nipples: Secondary | ICD-10-CM | POA: Insufficient documentation

## 2018-05-21 DIAGNOSIS — N651 Disproportion of reconstructed breast: Secondary | ICD-10-CM | POA: Diagnosis not present

## 2018-05-21 DIAGNOSIS — C50512 Malignant neoplasm of lower-outer quadrant of left female breast: Secondary | ICD-10-CM | POA: Diagnosis not present

## 2018-05-21 DIAGNOSIS — Z79810 Long term (current) use of selective estrogen receptor modulators (SERMs): Secondary | ICD-10-CM | POA: Insufficient documentation

## 2018-05-21 DIAGNOSIS — Z171 Estrogen receptor negative status [ER-]: Secondary | ICD-10-CM | POA: Insufficient documentation

## 2018-05-21 HISTORY — PX: LIPOSUCTION WITH LIPOFILLING: SHX6436

## 2018-05-21 SURGERY — LIPOSUCTION, WITH FAT TRANSFER
Anesthesia: General | Laterality: Bilateral

## 2018-05-21 MED ORDER — FENTANYL CITRATE (PF) 100 MCG/2ML IJ SOLN
50.0000 ug | INTRAMUSCULAR | Status: AC | PRN
  Administered 2018-05-21 (×2): 50 ug via INTRAVENOUS
  Administered 2018-05-21: 100 ug via INTRAVENOUS

## 2018-05-21 MED ORDER — OXYCODONE HCL 5 MG PO TABS
ORAL_TABLET | ORAL | Status: AC
  Filled 2018-05-21: qty 1

## 2018-05-21 MED ORDER — BUPIVACAINE-EPINEPHRINE 0.25% -1:200000 IJ SOLN
INTRAMUSCULAR | Status: DC | PRN
  Administered 2018-05-21: 3 mL

## 2018-05-21 MED ORDER — MIDAZOLAM HCL 2 MG/2ML IJ SOLN
1.0000 mg | INTRAMUSCULAR | Status: DC | PRN
  Administered 2018-05-21: 2 mg via INTRAVENOUS

## 2018-05-21 MED ORDER — MIDAZOLAM HCL 2 MG/2ML IJ SOLN
INTRAMUSCULAR | Status: AC
  Filled 2018-05-21: qty 2

## 2018-05-21 MED ORDER — OXYCODONE HCL 5 MG PO TABS: 5.0000 mg | ORAL_TABLET | ORAL | Status: DC | PRN

## 2018-05-21 MED ORDER — LACTATED RINGERS IV SOLN
INTRAVENOUS | Status: DC
  Administered 2018-05-21 (×2): via INTRAVENOUS

## 2018-05-21 MED ORDER — FENTANYL CITRATE (PF) 100 MCG/2ML IJ SOLN
INTRAMUSCULAR | Status: AC
  Filled 2018-05-21: qty 2

## 2018-05-21 MED ORDER — LIDOCAINE 2% (20 MG/ML) 5 ML SYRINGE
INTRAMUSCULAR | Status: AC
  Filled 2018-05-21: qty 5

## 2018-05-21 MED ORDER — DEXAMETHASONE SODIUM PHOSPHATE 10 MG/ML IJ SOLN
INTRAMUSCULAR | Status: AC
  Filled 2018-05-21: qty 1

## 2018-05-21 MED ORDER — LIDOCAINE HCL (PF) 1 % IJ SOLN
INTRAMUSCULAR | Status: AC
  Filled 2018-05-21: qty 60

## 2018-05-21 MED ORDER — EPHEDRINE SULFATE 50 MG/ML IJ SOLN
INTRAMUSCULAR | Status: DC | PRN
  Administered 2018-05-21: 15 mg via INTRAVENOUS

## 2018-05-21 MED ORDER — PHENYLEPHRINE 40 MCG/ML (10ML) SYRINGE FOR IV PUSH (FOR BLOOD PRESSURE SUPPORT)
PREFILLED_SYRINGE | INTRAVENOUS | Status: AC
  Filled 2018-05-21: qty 10

## 2018-05-21 MED ORDER — PROMETHAZINE HCL 25 MG/ML IJ SOLN: 6.2500 mg | INTRAMUSCULAR | Status: DC | PRN

## 2018-05-21 MED ORDER — SCOPOLAMINE 1 MG/3DAYS TD PT72: 1.0000 | MEDICATED_PATCH | Freq: Once | TRANSDERMAL | Status: DC | PRN

## 2018-05-21 MED ORDER — LACTATED RINGERS IV SOLN
INTRAVENOUS | Status: AC | PRN
  Administered 2018-05-21: 1000 mL via INTRAVENOUS

## 2018-05-21 MED ORDER — CEFAZOLIN SODIUM-DEXTROSE 2-4 GM/100ML-% IV SOLN: 2.0000 g | INTRAVENOUS | Status: DC

## 2018-05-21 MED ORDER — PROPOFOL 10 MG/ML IV BOLUS
INTRAVENOUS | Status: AC
  Filled 2018-05-21: qty 20

## 2018-05-21 MED ORDER — LIDOCAINE-EPINEPHRINE 1 %-1:100000 IJ SOLN
INTRAMUSCULAR | Status: AC
  Filled 2018-05-21: qty 1

## 2018-05-21 MED ORDER — ACETAMINOPHEN 650 MG RE SUPP: 650.0000 mg | RECTAL | Status: DC | PRN

## 2018-05-21 MED ORDER — PROPOFOL 10 MG/ML IV BOLUS
INTRAVENOUS | Status: DC | PRN
  Administered 2018-05-21: 200 mg via INTRAVENOUS

## 2018-05-21 MED ORDER — PROPOFOL 500 MG/50ML IV EMUL
INTRAVENOUS | Status: AC
  Filled 2018-05-21: qty 50

## 2018-05-21 MED ORDER — SODIUM CHLORIDE 0.9 % IV SOLN: 250.0000 mL | INTRAVENOUS | Status: DC | PRN

## 2018-05-21 MED ORDER — SODIUM CHLORIDE 0.9% FLUSH: 3.0000 mL | INTRAVENOUS | Status: DC | PRN

## 2018-05-21 MED ORDER — HYDROCODONE-ACETAMINOPHEN 5-325 MG PO TABS: 1.0000 | ORAL_TABLET | Freq: Four times a day (QID) | ORAL | 0 refills | Status: AC | PRN

## 2018-05-21 MED ORDER — ONDANSETRON HCL 4 MG/2ML IJ SOLN
INTRAMUSCULAR | Status: AC
  Filled 2018-05-21: qty 2

## 2018-05-21 MED ORDER — EPINEPHRINE 30 MG/30ML IJ SOLN
INTRAMUSCULAR | Status: AC
  Filled 2018-05-21: qty 1

## 2018-05-21 MED ORDER — DEXAMETHASONE SODIUM PHOSPHATE 4 MG/ML IJ SOLN
INTRAMUSCULAR | Status: DC | PRN
  Administered 2018-05-21: 10 mg via INTRAVENOUS

## 2018-05-21 MED ORDER — LIDOCAINE HCL (CARDIAC) PF 100 MG/5ML IV SOSY
PREFILLED_SYRINGE | INTRAVENOUS | Status: DC | PRN
  Administered 2018-05-21: 80 mg via INTRAVENOUS

## 2018-05-21 MED ORDER — CEFAZOLIN SODIUM-DEXTROSE 2-4 GM/100ML-% IV SOLN
INTRAVENOUS | Status: AC
  Filled 2018-05-21: qty 100

## 2018-05-21 MED ORDER — ONDANSETRON HCL 4 MG/2ML IJ SOLN
INTRAMUSCULAR | Status: DC | PRN
  Administered 2018-05-21: 4 mg via INTRAVENOUS

## 2018-05-21 MED ORDER — SUCCINYLCHOLINE CHLORIDE 200 MG/10ML IV SOSY
PREFILLED_SYRINGE | INTRAVENOUS | Status: AC
  Filled 2018-05-21: qty 10

## 2018-05-21 MED ORDER — FENTANYL CITRATE (PF) 100 MCG/2ML IJ SOLN: 25.0000 ug | INTRAMUSCULAR | Status: DC | PRN

## 2018-05-21 MED ORDER — OXYCODONE HCL 5 MG PO TABS
5.0000 mg | ORAL_TABLET | Freq: Once | ORAL | Status: AC | PRN
  Administered 2018-05-21: 5 mg via ORAL

## 2018-05-21 MED ORDER — SODIUM CHLORIDE 0.9% FLUSH: 3.0000 mL | Freq: Two times a day (BID) | INTRAVENOUS | Status: DC

## 2018-05-21 MED ORDER — EPHEDRINE 5 MG/ML INJ
INTRAVENOUS | Status: AC
  Filled 2018-05-21: qty 10

## 2018-05-21 MED ORDER — OXYCODONE HCL 5 MG/5ML PO SOLN: 5.0000 mg | Freq: Once | ORAL | Status: AC | PRN

## 2018-05-21 MED ORDER — ACETAMINOPHEN 325 MG PO TABS: 650.0000 mg | ORAL_TABLET | ORAL | Status: DC | PRN

## 2018-05-21 SURGICAL SUPPLY — 49 items
BINDER ABDOMINAL  9 SM 30-45 (SOFTGOODS) ×2
BINDER ABDOMINAL 10 UNV 27-48 (MISCELLANEOUS) IMPLANT
BINDER ABDOMINAL 12 SM 30-45 (SOFTGOODS) IMPLANT
BINDER ABDOMINAL 9 SM 30-45 (SOFTGOODS) ×1 IMPLANT
BINDER BREAST LRG (GAUZE/BANDAGES/DRESSINGS) ×3 IMPLANT
BINDER BREAST MEDIUM (GAUZE/BANDAGES/DRESSINGS) IMPLANT
BINDER BREAST XLRG (GAUZE/BANDAGES/DRESSINGS) IMPLANT
BINDER BREAST XXLRG (GAUZE/BANDAGES/DRESSINGS) IMPLANT
BLADE HEX COATED 2.75 (ELECTRODE) ×3 IMPLANT
BLADE SURG 15 STRL LF DISP TIS (BLADE) ×1 IMPLANT
BLADE SURG 15 STRL SS (BLADE) ×2
BNDG GAUZE ELAST 4 BULKY (GAUZE/BANDAGES/DRESSINGS) ×6 IMPLANT
CHLORAPREP W/TINT 26ML (MISCELLANEOUS) ×3 IMPLANT
COVER BACK TABLE 60X90IN (DRAPES) ×3 IMPLANT
COVER MAYO STAND STRL (DRAPES) ×6 IMPLANT
COVER WAND RF STERILE (DRAPES) IMPLANT
DECANTER SPIKE VIAL GLASS SM (MISCELLANEOUS) IMPLANT
DERMABOND ADVANCED (GAUZE/BANDAGES/DRESSINGS) ×4
DERMABOND ADVANCED .7 DNX12 (GAUZE/BANDAGES/DRESSINGS) ×2 IMPLANT
DRAPE LAPAROSCOPIC ABDOMINAL (DRAPES) ×3 IMPLANT
DRSG PAD ABDOMINAL 8X10 ST (GAUZE/BANDAGES/DRESSINGS) ×9 IMPLANT
ELECT REM PT RETURN 9FT ADLT (ELECTROSURGICAL) ×3
ELECTRODE REM PT RTRN 9FT ADLT (ELECTROSURGICAL) ×1 IMPLANT
EXTRACTOR CANIST REVOLVE STRL (CANNISTER) ×3 IMPLANT
GLOVE BIO SURGEON STRL SZ 6.5 (GLOVE) ×8 IMPLANT
GLOVE BIO SURGEONS STRL SZ 6.5 (GLOVE) ×4
GOWN STRL REUS W/ TWL LRG LVL3 (GOWN DISPOSABLE) ×3 IMPLANT
GOWN STRL REUS W/TWL LRG LVL3 (GOWN DISPOSABLE) ×6
IV LACTATED RINGERS 1000ML (IV SOLUTION) ×3 IMPLANT
LINER CANISTER 1000CC FLEX (MISCELLANEOUS) ×3 IMPLANT
NDL SAFETY ECLIPSE 18X1.5 (NEEDLE) ×1 IMPLANT
NEEDLE HYPO 18GX1.5 SHARP (NEEDLE) ×2
NEEDLE HYPO 25X1 1.5 SAFETY (NEEDLE) ×3 IMPLANT
PACK BASIN DAY SURGERY FS (CUSTOM PROCEDURE TRAY) ×3 IMPLANT
PAD ALCOHOL SWAB (MISCELLANEOUS) ×3 IMPLANT
PENCIL BUTTON HOLSTER BLD 10FT (ELECTRODE) ×3 IMPLANT
SLEEVE SCD COMPRESS KNEE MED (MISCELLANEOUS) ×3 IMPLANT
SPONGE LAP 18X18 RF (DISPOSABLE) ×3 IMPLANT
SUT MNCRL AB 4-0 PS2 18 (SUTURE) ×3 IMPLANT
SUT MON AB 5-0 PS2 18 (SUTURE) ×3 IMPLANT
SYR 10ML LL (SYRINGE) ×12 IMPLANT
SYR 3ML 18GX1 1/2 (SYRINGE) IMPLANT
SYR 50ML LL SCALE MARK (SYRINGE) ×6 IMPLANT
SYR CONTROL 10ML LL (SYRINGE) ×3 IMPLANT
SYR TOOMEY 50ML (SYRINGE) ×6 IMPLANT
TOWEL GREEN STERILE FF (TOWEL DISPOSABLE) ×6 IMPLANT
TUBING INFILTRATION IT-10001 (TUBING) ×3 IMPLANT
TUBING SET GRADUATE ASPIR 12FT (MISCELLANEOUS) ×3 IMPLANT
UNDERPAD 30X30 (UNDERPADS AND DIAPERS) ×6 IMPLANT

## 2018-05-21 NOTE — Anesthesia Postprocedure Evaluation (Signed)
Anesthesia Post Note  Patient: Kelsey Klein  Procedure(s) Performed: liposuction with fat grafting and revolve (Bilateral )     Patient location during evaluation: PACU Anesthesia Type: General Level of consciousness: awake and alert Pain management: pain level controlled Vital Signs Assessment: post-procedure vital signs reviewed and stable Respiratory status: spontaneous breathing, nonlabored ventilation and respiratory function stable Cardiovascular status: blood pressure returned to baseline and stable Postop Assessment: no apparent nausea or vomiting Anesthetic complications: no    Last Vitals:  Vitals:   05/21/18 1315 05/21/18 1330  BP: 121/87 115/88  Pulse: 73 66  Resp: 20 19  Temp:    SpO2: 99% 100%    Last Pain:  Vitals:   05/21/18 1330  TempSrc:   PainSc: [place]

## 2018-05-21 NOTE — Interval H&P Note (Signed)
History and Physical Interval Note:  05/21/2018 10:20 AM  Kelsey Klein  has presented today for surgery, with the diagnosis of Malignant Neoplasm Of Female Breast, Unspecified Estrogen Receptor Status, Unspecified Laterality, Unspecified Site Of Breast  The various methods of treatment have been discussed with the patient and family. After consideration of risks, benefits and other options for treatment, the patient has consented to  Procedure(s): liposuction with fat grafting and revolve (Bilateral) as a surgical intervention .  The patient's history has been reviewed, patient examined, no change in status, stable for surgery.  I have reviewed the patient's chart and labs.  Questions were answered to the patient's satisfaction.     Loel Lofty Dillingham

## 2018-05-21 NOTE — Addendum Note (Signed)
Addended by: Wallace Going on: 05/21/2018 12:26 PM   Modules accepted: Orders

## 2018-05-21 NOTE — Anesthesia Procedure Notes (Signed)
Procedure Name: LMA Insertion Date/Time: 05/21/2018 10:59 AM Performed by: Willa Frater, CRNA Pre-anesthesia Checklist: Patient identified, Emergency Drugs available, Suction available and Patient being monitored Patient Re-evaluated:Patient Re-evaluated prior to induction Oxygen Delivery Method: Circle system utilized Preoxygenation: Pre-oxygenation with 100% oxygen Induction Type: IV induction Ventilation: Mask ventilation without difficulty LMA: LMA inserted LMA Size: 4.0 Number of attempts: 1 Airway Equipment and Method: Bite block Placement Confirmation: positive ETCO2 Tube secured with: Tape Dental Injury: Teeth and Oropharynx as per pre-operative assessment

## 2018-05-21 NOTE — Anesthesia Preprocedure Evaluation (Addendum)
Anesthesia Evaluation  Patient identified by MRN, date of birth, ID band Patient awake    Reviewed: Allergy & Precautions, NPO status , Patient's Chart, lab work & pertinent test results  History of Anesthesia Complications Negative for: history of anesthetic complications  Airway Mallampati: III  TM Distance: >3 FB Neck ROM: Full    Dental  (+) Dental Advisory Given, Teeth Intact   Pulmonary neg pulmonary ROS,    breath sounds clear to auscultation       Cardiovascular negative cardio ROS   Rhythm:Regular Rate:Normal     Neuro/Psych negative neurological ROS  negative psych ROS   GI/Hepatic negative GI ROS, Neg liver ROS,   Endo/Other  negative endocrine ROS  Renal/GU negative Renal ROS     Musculoskeletal negative musculoskeletal ROS (+)   Abdominal   Peds  Hematology negative hematology ROS (+)   Anesthesia Other Findings   Reproductive/Obstetrics  Breast cancer                             Anesthesia Physical Anesthesia Plan  ASA: II  Anesthesia Plan: General   Post-op Pain Management:    Induction: Intravenous  PONV Risk Score and Plan: 4 or greater and Treatment may vary due to age or medical condition, Ondansetron, Dexamethasone, Midazolam and Scopolamine patch - Pre-op  Airway Management Planned: LMA  Additional Equipment: None  Intra-op Plan:   Post-operative Plan: Extubation in OR  Informed Consent: I have reviewed the patients History and Physical, chart, labs and discussed the procedure including the risks, benefits and alternatives for the proposed anesthesia with the patient or authorized representative who has indicated his/her understanding and acceptance.   Dental advisory given  Plan Discussed with: CRNA and Anesthesiologist  Anesthesia Plan Comments:        Anesthesia Quick Evaluation

## 2018-05-21 NOTE — Addendum Note (Signed)
Addendum  created 05/21/18 1401 by Willa Frater, CRNA   Intraprocedure Meds edited

## 2018-05-21 NOTE — Discharge Instructions (Addendum)
INSTRUCTIONS FOR AFTER BREAST SURGERY ° ° °You are getting ready to undergo breast surgery.  You will likely have some questions about what to expect following your operation.  The following information will help you and your family understand what to expect when you are discharged from the hospital.  Following these guidelines will help ensure a smooth recovery and reduce risks of complications.   °Postoperative instructions include information on: diet, wound care, medications and physical activity. ° °AFTER SURGERY °Expect to go home after the procedure.  In some cases, you may need to spend one night in the hospital for observation. ° °DIET °Breast surgery does not require a specific diet.  However, I have to mention that the healthier you eat the better your body can start healing. It is important to increasing your protein intake.  This means limiting the foods with sugar and carbohydrates.  Focus on vegetables and some meat.  If you have any liposuction during your procedure be sure to drink water.  If your urine is bright yellow, then it is concentrated, and you need to drink more water.  As a general rule after surgery, you should have 8 ounces of water every hour while awake.  If you find you are persistently nauseated or unable to take in liquids let us know.  NO TOBACCO USE or EXPOSURE.  This will slow your healing process and increase the risk of a wound. ° °WOUND CARE °You can shower the day after surgery if you don't have a drain.  Use fragrance free soap.  Dial, Dove and Ivory are usually mild on the skin. If you have a drain clean with baby wipes until the drain is removed.  If you have steri-strips / tape directly attached to your skin leave them in place. It is OK to get these wet.  No baths, pools or hot tubs for two weeks. °We close your incision to leave the smallest and best-looking scar. No ointment or creams on your incisions until given the go ahead.  Especially not Neosporin (Too many skin  reactions with this one).  A few weeks after surgery you can use Mederma and start massaging the scar. °We ask you to wear your binder or sports bra for the first 6 weeks around the clock, including while sleeping. This provides added comfort and helps reduce the fluid accumulation at the surgery site. ° °ACTIVITY °No heavy lifting until cleared by the doctor.  This usually means no more than a half-gallon of milk.  It is OK to walk and climb stairs. In fact, moving your legs is very important to decrease your risk of a blood clot.  It will also help keep you from getting deconditioned.  Every 1 to 2 hours get up and walk for 5 minutes. This will help with a quicker recovery back to normal.  Let pain be your guide so you don't do too much.  NO, you cannot do the spring cleaning and don't plan on taking care of anyone else.  This is your time for TLC.  °You will be more comfortable if you sleep and rest with your head elevated either with a few pillows under you or in a recliner.  No stomach sleeping for a few months. ° °WORK °Everyone returns to work at different times. As a rough guide, most people take at least 1 - 2 weeks off prior to returning to work. If you need documentation for your job, bring the forms to your postoperative follow   up visit.  DRIVING Arrange for someone to bring you home from the hospital.  You may be able to drive a few days after surgery but not while taking any narcotics or valium.  BOWEL MOVEMENTS Constipation can occur after anesthesia and while taking pain medication.  It is important to stay ahead for your comfort.  We recommend taking Milk of Magnesia (2 tablespoons; twice a day) while taking the pain pills.  SEROMA This is fluid your body tried to put in the surgical site.  This is normal but if it creates tight skinny skin let us know.  It usually decreases in a few weeks.  WHEN TO CALL Call your surgeon's office if any of the following occur:  Fever 101 degrees F or  greater  Excessive bleeding or fluid from the incision site.  Pain that increases over time without aid from the medications  Redness, warmth, or pus draining from incision sites  Persistent nausea or inability to take in liquids  Severe misshapen area that underwent the operation.       Post Anesthesia Home Care Instructions  Activity: Get plenty of rest for the remainder of the day. A responsible individual must stay with you for 24 hours following the procedure.  For the next 24 hours, DO NOT: -Drive a car -Paediatric nurse -Drink alcoholic beverages -Take any medication unless instructed by your physician -Make any legal decisions or sign important papers.  Meals: Start with liquid foods such as gelatin or soup. Progress to regular foods as tolerated. Avoid greasy, spicy, heavy foods. If nausea and/or vomiting occur, drink only clear liquids until the nausea and/or vomiting subsides. Call your physician if vomiting continues.  Special Instructions/Symptoms: Your throat may feel dry or sore from the anesthesia or the breathing tube placed in your throat during surgery. If this causes discomfort, gargle with warm salt water. The discomfort should disappear within 24 hours.  If you had a scopolamine patch placed behind your ear for the management of post- operative nausea and/or vomiting:  1. The medication in the patch is effective for 72 hours, after which it should be removed.  Wrap patch in a tissue and discard in the trash. Wash hands thoroughly with soap and water. 2. You may remove the patch earlier than 72 hours if you experience unpleasant side effects which may include dry mouth, dizziness or visual disturbances. 3. Avoid touching the patch. Wash your hands with soap and water after contact with the patch.

## 2018-05-21 NOTE — Op Note (Signed)
DATE OF OPERATION: 05/21/2018  LOCATION: [facility] outpatient  PREOPERATIVE DIAGNOSIS: Breast asymmetry after breast reconstruction for cancer  POSTOPERATIVE DIAGNOSIS: Same  PROCEDURE: Lipofilling of bilateral breasts for symmetry  SURGEON: Khristi Schiller [place] Kataleena Holsapple, DO  ASSISTANT: Ronette Deter, PA  EBL: none  CONDITION: Stable  COMPLICATIONS: None  INDICATION: The patient, Kelsey Klein, is a 47 y.o. female born on 09-Aug-1970, is here for treatment of breast asymmetry after reconstruction for cancer treatment.   PROCEDURE DETAILS:  The patient was seen prior to surgery and marked.  The IV antibiotics were given. The patient was taken to the operating room and given a general anesthetic. A standard time out was performed and all information was confirmed by those in the room. SCDs were placed.   The abdominal and chest area were prepped and draped.  The umbilical and left lateral abdominal incision site was injected with local.  The medial aspect of both breast scars were injected with local.  The tumescent was placed in the abdominal fat space.  After waiting for the local to take effect the liposuction was performed.  We washed the fat in the usual sterile fashion with saline.  The Revolve was utilized according to the manufacture guidelines.  The fat was then placed via syringe and cannula into the medial superior aspect of both breast soft tissue space for better symmetry.  On the right: 20 cc was placed.  On the left 70 cc was placed. The incisions were closed with the 5-0 Monocryl.   The patient was allowed to wake up and taken to recovery room in stable condition at the end of the case. The family was notified at the end of the case.

## 2018-05-21 NOTE — Transfer of Care (Signed)
Immediate Anesthesia Transfer of Care Note  Patient: Kelsey Klein  Procedure(s) Performed: liposuction with fat grafting and revolve (Bilateral )  Patient Location: PACU  Anesthesia Type:General  Level of Consciousness: awake, alert , oriented and drowsy  Airway & Oxygen Therapy: Patient Spontanous Breathing and Patient connected to face mask oxygen  Post-op Assessment: Report given to RN and Post -op Vital signs reviewed and stable  Post vital signs: Reviewed and stable  Last Vitals:  Vitals Value Taken Time  BP 131/86 05/21/2018 12:25 PM  Temp    Pulse 99 05/21/2018 12:28 PM  Resp 23 05/21/2018 12:28 PM  SpO2 100 % 05/21/2018 12:28 PM  Vitals shown include unvalidated device data.  Last Pain:  Vitals:   05/21/18 0827  TempSrc: [place]  PainSc: 0-No pain         Complications: No apparent anesthesia complications

## 2018-05-22 ENCOUNTER — Encounter (HOSPITAL_BASED_OUTPATIENT_CLINIC_OR_DEPARTMENT_OTHER): Payer: Self-pay | Admitting: Plastic Surgery

## 2018-06-02 ENCOUNTER — Encounter: Payer: Self-pay | Admitting: Physician Assistant

## 2018-06-02 ENCOUNTER — Ambulatory Visit (INDEPENDENT_AMBULATORY_CARE_PROVIDER_SITE_OTHER): Payer: Medicaid Other | Admitting: Physician Assistant

## 2018-06-02 VITALS — BP 100/66 | HR 72 | Ht 63.0 in | Wt 146.0 lb

## 2018-06-02 DIAGNOSIS — N651 Disproportion of reconstructed breast: Secondary | ICD-10-CM

## 2018-06-02 DIAGNOSIS — Z9889 Other specified postprocedural states: Secondary | ICD-10-CM

## 2018-06-02 NOTE — Progress Notes (Signed)
   Subjective:    Patient ID: Kelsey Klein, female    DOB: 1971/04/08, 47 y.o.   MRN: [id number]  HPI Very pleasant 47 year old African-American female presents to clinic 1 week status post fat grafting.  Dr. Marla Roe did liposuction of the patient's abdomen and then used a fat grafting technique to improve breast symmetry status post breast reconstruction after cancer.  Patient reported some pain for the first couple of days for which she took hydrocodone.  Since then she has been just taking Tylenol and ibuprofen as needed.  Patient return to work on Thursday part-time and has not noted any ill effects.  Patient is pleased with her results.  No sign of infection discharge.  Mild edema noted of the left breast.   Review of Systems  Constitutional: Negative.   Respiratory: Negative.   Cardiovascular: Negative.   Gastrointestinal: Negative.   Musculoskeletal: Negative.   Skin: Negative.   Psychiatric/Behavioral: Negative.        Objective:   Physical Exam Constitutional:      Appearance: Normal appearance.  Neck:     Musculoskeletal: Normal range of motion.  Pulmonary:     Breath sounds: Normal breath sounds.  Abdominal:     Palpations: Abdomen is soft.  Skin:    General: Skin is warm and dry.  Neurological:     Mental Status: She is alert and oriented to person, place, and time.  Psychiatric:        Mood and Affect: Mood normal.        Behavior: Behavior normal.        Thought Content: Thought content normal.        Judgment: Judgment normal.           Assessment & Plan:   Patient is status post liposuction and fat grafting.  Dr. Marla Roe will order physical therapy to include massage of intermittent fat lumps to achieve a smooth appearance.  Patient also advised to massage the areas herself.  Patient will return to clinic as needed

## 2018-06-14 ENCOUNTER — Emergency Department
Admission: EM | Admit: 2018-06-14 | Discharge: 2018-06-14 | Disposition: A | Discharge status: Home / Self Care | Payer: Medicaid Other | Attending: Emergency Medicine | Admitting: Emergency Medicine

## 2018-06-14 ENCOUNTER — Emergency Department: Payer: Medicaid Other

## 2018-06-14 ENCOUNTER — Encounter: Payer: Self-pay | Admitting: Emergency Medicine

## 2018-06-14 DIAGNOSIS — M79602 Pain in left arm: Secondary | ICD-10-CM | POA: Diagnosis present

## 2018-06-14 DIAGNOSIS — Z9013 Acquired absence of bilateral breasts and nipples: Secondary | ICD-10-CM | POA: Insufficient documentation

## 2018-06-14 DIAGNOSIS — Z79899 Other long term (current) drug therapy: Secondary | ICD-10-CM | POA: Insufficient documentation

## 2018-06-14 DIAGNOSIS — Z853 Personal history of malignant neoplasm of breast: Secondary | ICD-10-CM | POA: Diagnosis not present

## 2018-06-14 NOTE — ED Notes (Signed)
E-signature not working at this time. Pt verbalized understanding of D/C instructions. No further questions at this time. Pt ambulatory to lobby in NAD.

## 2018-06-14 NOTE — ED Triage Notes (Signed)
Patient presents to the ED with left arm pain x 2 weeks.  Patient states she had a breast reconstructive surgery approx. 3 weeks ago.  Patient states she had similar pain with a prior breast surgery but this pain is worse.  Patient states her PCP suggested she come to the ED to get an [place] of her arm.  Patient states arm is painful when she stretches it.  Patient reports a lot of discomfort putting on clothes.

## 2018-06-14 NOTE — ED Provider Notes (Signed)
  [facility] Emergency Department Provider Note   ____________________________________________    I have reviewed the triage vital signs and the nursing notes.   HISTORY  Chief Complaint Arm Pain     HPI Kelsey Klein is a 47 y.o. female who presents with complaints of left arm pain.  Patient reports symptoms have been ongoing since she had breast reconstruction 3 weeks ago.  Patient reports last time she had the surgery she had similar pain possibly related to lymph node removal.  That pain resolved without intervention.  She reports she called her surgeon who recommended she come to the emergency department for ultrasound to rule out DVT.  No fevers chills, no rash redness or bruising.  Has not taken anything for this   Past Medical History:  Diagnosis Date  . Breast cancer, stage 1, estrogen receptor negative, right (HCC) 08/19/2015    18 mm; pT1c  pN0 (0/6nodes positive). ER/PR +; Her 2 neu not overexpressed  . Family history of breast cancer     Patient Active Problem List   Diagnosis Date Noted  . Breast asymmetry following reconstructive surgery 03/18/2018  . Breast cancer in female (HCC) 08/18/2017  . Family history of breast cancer   . Primary cancer of lower-outer quadrant of left breast (HCC) 05/31/2017    Past Surgical History:  Procedure Laterality Date  . 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 BREAST BIOPSY Left 05/27/2017   Affirm Bx- DCIS  . BREAST RECONSTRUCTION WITH PLACEMENT OF TISSUE EXPANDER AND FLEX HD (ACELLULAR HYDRATED DERMIS) Bilateral 08/18/2017   Procedure: BREAST RECONSTRUCTION WITH PLACEMENT OF TISSUE EXPANDER AND FLEX HD (ACELLULAR HYDRATED DERMIS);  Surgeon: Dillingham, Claire S, DO;  Location: [facility];  Service: Plastics;  Laterality: Bilateral;  . LIPOSUCTION WITH LIPOFILLING Bilateral 05/21/2018   Procedure: liposuction with fat grafting and revolve;   Surgeon: Dillingham, Claire S, DO;  Location: [facility];  Service: Plastics;  Laterality: Bilateral;  . MASTECTOMY W/ SENTINEL NODE BIOPSY Bilateral 08/18/2017   Procedure: MASTECTOMY WITH SENTINEL LYMPH NODE BIOPSY,FROZEN SECTION;  Surgeon: Byrnett, Jeffrey W, MD;  Location: [facility];  Service: General;  Laterality: Bilateral;  . REMOVAL OF TISSUE EXPANDER AND PLACEMENT OF IMPLANT Bilateral 12/10/2017   Procedure: REMOVAL OF TISSUE EXPANDERS AND PLACEMENT OF IMPLANTS;  Surgeon: Dillingham, Claire S, DO;  Location: [facility];  Service: Plastics;  Laterality: Bilateral;    Prior to Admission medications   Medication Sig Start Date End Date Taking? Authorizing Provider  Collagen Hydrolysate POWD by Does not apply route daily. Pt takes collagen protein powder     [provider]  oxycodone (OXY-IR) 5 MG capsule Take 5 mg by mouth every 4 (four) hours as needed.    [provider]  tamoxifen (NOLVADEX) 20 MG tablet Take 1 tablet (20 mg total) by mouth daily. 12/31/17   Finnegan, Timothy J, MD     Allergies Patient has no known allergies.  Family History  Problem Relation Age of Onset  . Arthritis Mother   . Cirrhosis Father        deceased 55; adopted  . Hypertension Father   . Hypertension Sister   . Alzheimer's disease Maternal Grandmother   . Breast cancer Paternal Aunt        age at dx unknown  . Breast cancer Paternal Aunt        age at dx unknown      Social History Social History   Tobacco Use  . Smoking status: Never Smoker  . Smokeless tobacco: Never Used  Substance Use Topics  . Alcohol use: Not Currently  . Drug use: No    Review of Systems  Constitutional: No fever/chills  ENT: No neck pain    Musculoskeletal: As above Skin: Negative for rash. Neurological: Negative for numbness or tingling    ____________________________________________   PHYSICAL EXAM:  VITAL SIGNS: ED Triage Vitals  Enc Vitals Group       BP 06/14/18 1807 120/70     Pulse Rate 06/14/18 1807 60     Resp 06/14/18 1807 12     Temp 06/14/18 1807 98.2 F (36.8 C)     Temp Source 06/14/18 1807 Oral     SpO2 06/14/18 1807 98 %     Weight 06/14/18 1807 65.8 kg (145 lb)     Height 06/14/18 1807 1.6 m (5' 3")     Head Circumference --      Peak Flow --      Pain Score 06/14/18 1813 7     Pain Loc --      Pain Edu? --      Excl. in GC? --      Constitutional: Alert and oriented. No acute distress. Pleasant and interactive   Cardiovascular: Normal rate, regular rhythm.  Respiratory: Normal respiratory effort.  No retractions.  Musculoskeletal: Patient with mild tenderness palpation along the proximal lateral forearm but no swelling erythema bruising or discoloration.  2+ distal pulses.  No rash. Neurologic:   No gross focal neurologic deficits are appreciated.   Skin:  Skin is warm, dry and intact. No rash noted.   ____________________________________________   LABS (all labs ordered are listed, but only abnormal results are displayed)  Labs Reviewed - No data to display ____________________________________________  EKG   ____________________________________________  RADIOLOGY  Ultrasound negative for DVT ____________________________________________   PROCEDURES  Procedure(s) performed: No  Procedures   Critical Care performed: No ____________________________________________   INITIAL IMPRESSION / ASSESSMENT AND PLAN / ED COURSE  Pertinent labs & imaging results that were available during my care of the patient were reviewed by me and considered in my medical decision making (see chart for details).  Patient with reassuring exam, negative ultrasound, normal pulses distally in the left arm.  No erythema to suggest infection.  Unclear cause of her pain, recommend Ortho follow-up if no improvement with anti-inflammatories, rice   ____________________________________________   FINAL CLINICAL  IMPRESSION(S) / ED DIAGNOSES  Final diagnoses:  Left arm pain      NEW MEDICATIONS STARTED DURING THIS VISIT:  Discharge Medication List as of 06/14/2018 10:01 PM       Note:  This document was prepared using Dragon voice recognition software and may include unintentional dictation errors.   Kinner, Robert, MD 06/14/18 2329  

## 2018-06-14 NOTE — ED Notes (Signed)
Pt currently c/o 4/10 pain in the left arm. Pt denies numbness/tingling in the left extremity. 2+ pulse present in bilateral extremities. Full sensation noted in bilateral extremities as well.

## 2018-06-15 ENCOUNTER — Telehealth: Payer: Self-pay | Admitting: Physician Assistant

## 2018-06-15 NOTE — Telephone Encounter (Signed)
Contacted pt.  She presented at the ED.  No DVT.  She will continue with ice and NSAIDs and contact us if it is not resolving.  Pain is intermittent.  Primarily with touch or stretching.

## 2018-07-01 ENCOUNTER — Telehealth: Payer: Self-pay | Admitting: Physician Assistant

## 2018-07-01 ENCOUNTER — Other Ambulatory Visit: Payer: Self-pay | Admitting: Physician Assistant

## 2018-07-01 DIAGNOSIS — Z9889 Other specified postprocedural states: Secondary | ICD-10-CM

## 2018-07-01 DIAGNOSIS — M79601 Pain in right arm: Secondary | ICD-10-CM

## 2018-07-01 DIAGNOSIS — M79602 Pain in left arm: Secondary | ICD-10-CM

## 2018-07-03 NOTE — Telephone Encounter (Signed)
Pt taken care of.

## 2018-07-05 NOTE — Progress Notes (Signed)
[place]  Telephone:(336) [phone] Fax:(336) [phone]  ID: Kelsey Klein OB: Feb 11, 1971  MR#: [id number]  XBW#:[id number]  Patient Care Team: [facility], [place] as PCP - General (General Practice) Rico Junker, RN as Registered Nurse  CHIEF COMPLAINT: Pathologic stage Ia ER-PR positive, HER-2 negative invasive lower outer quadrant the left breast.  INTERVAL HISTORY: Patient returns to clinic today for routine six-month follow-up.  She recently completed all of her surgery for her bilateral mastectomy and reconstruction.  She has persistent left arm pain and cannot extend her left elbow fully.  She otherwise feels well. She continues to tolerate tamoxifen without significant side effects.  She has no neurologic complaints.  She denies any recent fevers or illnesses.  She has a good appetite and denies weight loss.  She has no chest pain or shortness of breath.  She denies any nausea, vomiting, constipation, or diarrhea.  She has no urinary complaints.  Patient offers no further specific complaints today.  REVIEW OF SYSTEMS:   Review of Systems  Constitutional: Negative.  Negative for fever, malaise/fatigue and weight loss.  Respiratory: Negative.  Negative for cough and shortness of breath.   Cardiovascular: Negative.  Negative for chest pain and leg swelling.  Gastrointestinal: Negative.  Negative for abdominal pain and constipation.  Genitourinary: Negative.  Negative for dysuria.  Musculoskeletal: Negative.  Negative for back pain.  Skin: Negative.  Negative for rash.  Neurological: Negative.  Negative for focal weakness, weakness and headaches.  Psychiatric/Behavioral: Negative.  The patient is not nervous/anxious.     As per HPI. Otherwise, a complete review of systems is negative.  PAST MEDICAL HISTORY: Past Medical History:  Diagnosis Date  . Breast cancer, stage 1, estrogen receptor negative, right ([place]) 08/19/2015    18 mm; pT1c   pN0 (0/6nodes positive). ER/PR +; Her 2 neu not overexpressed  . Family history of breast cancer     PAST SURGICAL HISTORY: Past Surgical History:  Procedure Laterality Date  . 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 BREAST BIOPSY Left 05/27/2017   Affirm Bx- DCIS  . BREAST RECONSTRUCTION WITH PLACEMENT OF TISSUE EXPANDER AND FLEX HD (ACELLULAR HYDRATED DERMIS) Bilateral 08/18/2017   Procedure: BREAST RECONSTRUCTION WITH PLACEMENT OF TISSUE EXPANDER AND FLEX HD (ACELLULAR HYDRATED DERMIS);  Surgeon: Wallace Going, DO;  Location: [facility];  Service: Plastics;  Laterality: Bilateral;  . LIPOSUCTION WITH LIPOFILLING Bilateral 05/21/2018   Procedure: liposuction with fat grafting and revolve;  Surgeon: Wallace Going, DO;  Location: [facility];  Service: Plastics;  Laterality: Bilateral;  . MASTECTOMY W/ SENTINEL NODE BIOPSY Bilateral 08/18/2017   Procedure: MASTECTOMY WITH SENTINEL LYMPH NODE BIOPSY,FROZEN SECTION;  Surgeon: Robert Bellow, MD;  Location: [facility];  Service: General;  Laterality: Bilateral;  . REMOVAL OF TISSUE EXPANDER AND PLACEMENT OF IMPLANT Bilateral 12/10/2017   Procedure: REMOVAL OF TISSUE EXPANDERS AND PLACEMENT OF IMPLANTS;  Surgeon: Wallace Going, DO;  Location: [place];  Service: Plastics;  Laterality: Bilateral;    FAMILY HISTORY: Family History  Problem Relation Age of Onset  . Arthritis Mother   . Cirrhosis Father        deceased 62; adopted  . Hypertension Father   . Hypertension Sister   . Alzheimer's disease Maternal Grandmother   . Breast cancer Paternal Aunt        age at dx unknown  . Breast cancer Paternal  Aunt        age at dx unknown    ADVANCED DIRECTIVES (Y/N):  N  HEALTH MAINTENANCE: Social History   Tobacco Use  . Smoking status: Never Smoker  . Smokeless tobacco: Never Used  Substance Use Topics  . Alcohol use: Not Currently    . Drug use: No     Colonoscopy:  PAP:  Bone density:  Lipid panel:  No Known Allergies  Current Outpatient Medications  Medication Sig Dispense Refill  . diazepam (VALIUM) 2 MG tablet Take 1 tablet by mouth.    . Collagen Hydrolysate POWD by Does not apply route daily. Pt takes collagen protein powder     . oxycodone (OXY-IR) 5 MG capsule Take 5 mg by mouth every 4 (four) hours as needed.    . tamoxifen (NOLVADEX) 20 MG tablet Take 1 tablet (20 mg total) by mouth daily. 90 tablet 2   No current facility-administered medications for this visit.     OBJECTIVE: Vitals:   07/07/18 1525  BP: 120/77  Pulse: 73  Temp: (!) 96.9 F (36.1 C)     Body mass index is 26.52 kg/m.    ECOG FS:0 - Asymptomatic  General: Well-developed, well-nourished, no acute distress. Eyes: Pink conjunctiva, anicteric sclera. HEENT: Normocephalic, moist mucous membranes. Breast: Bilateral mastectomy with reconstruction. Lungs: Clear to auscultation bilaterally. Heart: Regular rate and rhythm. No rubs, murmurs, or gallops. Abdomen: Soft, nontender, nondistended. No organomegaly noted, normoactive bowel sounds. Musculoskeletal: Unable to extend left arm fully.  Strength intact. Neuro: Alert, answering all questions appropriately. Cranial nerves grossly intact. Skin: No rashes or petechiae noted. Psych: Normal affect.  LAB RESULTS:  No results found for: NA, K, CL, CO2, GLUCOSE, BUN, CREATININE, CALCIUM, PROT, ALBUMIN, AST, ALT, ALKPHOS, BILITOT, GFRNONAA, GFRAA  No results found for: WBC, NEUTROABS, HGB, HCT, MCV, PLT   STUDIES: US Venous Img Upper Uni Left  Result Date: 06/14/2018 CLINICAL DATA:  Status post left mastectomy.  Postoperative pain. EXAM: LEFT UPPER EXTREMITY VENOUS DOPPLER ULTRASOUND TECHNIQUE: Gray-scale sonography with graded compression, as well as color Doppler and duplex ultrasound were performed to evaluate the upper extremity deep venous system from the level of the  subclavian vein and including the jugular, axillary, basilic, radial, ulnar and upper cephalic vein. Spectral Doppler was utilized to evaluate flow at rest and with distal augmentation maneuvers. COMPARISON:  None. FINDINGS: Contralateral Subclavian Vein: Respiratory phasicity is normal and symmetric with the symptomatic side. No evidence of thrombus. Normal compressibility. Internal Jugular Vein: No evidence of thrombus. Normal compressibility, respiratory phasicity and response to augmentation. Subclavian Vein: No evidence of thrombus. Normal compressibility, respiratory phasicity and response to augmentation. Axillary Vein: No evidence of thrombus. Normal compressibility, respiratory phasicity and response to augmentation. Cephalic Vein: No evidence of thrombus. Normal compressibility, respiratory phasicity and response to augmentation. Basilic Vein: No evidence of thrombus. Normal compressibility, respiratory phasicity and response to augmentation. Brachial Veins: No evidence of thrombus. Normal compressibility, respiratory phasicity and response to augmentation. Radial Veins: No evidence of thrombus. Normal compressibility, respiratory phasicity and response to augmentation. Ulnar Veins: No evidence of thrombus. Normal compressibility, respiratory phasicity and response to augmentation. Venous Reflux:  None visualized. Other Findings:  None visualized. IMPRESSION: No evidence of DVT within the left upper extremity. Electronically Signed   By: Ilona Sorrel M.D.   On: 06/14/2018 20:17    ASSESSMENT: Pathologic stage Ia ER/PR positive, HER-2 negative invasive lower outer quadrant the left breast.  Oncotype DX score 13, low risk of  recurrence.  PLAN:    1.  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  Because of this she did not require adjuvant XRT.  Because of her low risk Oncotype  score, she did not require chemotherapy.  Continue tamoxifen for a total of 5 years completing in April 2024.  She does not require any further mammograms.  Return to clinic in 6 months for routine evaluation. 2.  Left arm pain/unable to extend: Possibly related to recent breast reconstruction.  Patient has been instructed to proceed with physical therapy as prescribed.    I spent a total of 20 minutes face-to-face with the patient of which greater than 50% of the visit was spent in counseling and coordination of care as detailed above.   Patient expressed understanding and was in agreement with this plan. She also understands that She can call clinic at any time with any questions, concerns, or complaints.   Cancer Staging Primary cancer of lower-outer quadrant of left breast [facility]) Staging form: Breast, AJCC 8th Edition - Clinical stage from 05/31/2017: Stage IB (cT2, cN0, cM0, G1, ER: Positive, PR: Positive, HER2: Negative) - Signed by Lloyd Huger, MD on 05/31/2017 - Pathologic stage from 09/26/2017: Stage IA (pT1c, pN0, cM0, G2, ER+, PR+, HER2-, Oncotype DX score: 13) - Signed by Lloyd Huger, MD on 09/26/2017   Lloyd Huger, MD   07/08/2018 6:26 AM

## 2018-07-07 ENCOUNTER — Other Ambulatory Visit: Payer: Self-pay

## 2018-07-07 ENCOUNTER — Inpatient Hospital Stay: Payer: Medicaid Other | Attending: Oncology | Admitting: Oncology

## 2018-07-07 VITALS — BP 120/77 | HR 73 | Temp 96.9°F | Ht 63.0 in | Wt 149.7 lb

## 2018-07-07 DIAGNOSIS — Z170 Estrogen receptor positive status [ER+]: Secondary | ICD-10-CM | POA: Insufficient documentation

## 2018-07-07 DIAGNOSIS — Z803 Family history of malignant neoplasm of breast: Secondary | ICD-10-CM | POA: Diagnosis not present

## 2018-07-07 DIAGNOSIS — Z9013 Acquired absence of bilateral breasts and nipples: Secondary | ICD-10-CM | POA: Diagnosis not present

## 2018-07-07 DIAGNOSIS — C50512 Malignant neoplasm of lower-outer quadrant of left female breast: Secondary | ICD-10-CM | POA: Diagnosis present

## 2018-07-07 DIAGNOSIS — Z79810 Long term (current) use of selective estrogen receptor modulators (SERMs): Secondary | ICD-10-CM | POA: Insufficient documentation

## 2018-07-07 DIAGNOSIS — M79602 Pain in left arm: Secondary | ICD-10-CM | POA: Insufficient documentation

## 2018-07-07 NOTE — Progress Notes (Signed)
Patient is here today to follow up on her Primary cancer of lower-outer quadrant of left breast. Patient stated that she continues to have pain and numbness on her left arm and forearm ever since she had surgery on 08/18/2017. Patient stated that she had mentioned this to her surgeon-Dr. Marla Roe and she recommended physical therapy. Patient stated that she does not have a follow up appointment with Dr. Marla Roe.

## 2018-08-03 ENCOUNTER — Ambulatory Visit: Payer: Medicaid Other | Admitting: Physical Therapy

## 2018-08-10 ENCOUNTER — Encounter: Payer: Medicaid Other | Admitting: Physical Therapy

## 2018-08-12 ENCOUNTER — Encounter: Payer: Medicaid Other | Admitting: Physical Therapy

## 2018-08-17 ENCOUNTER — Encounter: Payer: Medicaid Other | Admitting: Physical Therapy

## 2018-08-19 ENCOUNTER — Encounter: Payer: Medicaid Other | Admitting: Physical Therapy

## 2018-08-24 ENCOUNTER — Encounter: Payer: Medicaid Other | Admitting: Physical Therapy

## 2018-08-26 ENCOUNTER — Encounter: Payer: Medicaid Other | Admitting: Physical Therapy

## 2018-09-16 ENCOUNTER — Encounter: Payer: Self-pay | Admitting: *Deleted

## 2018-09-16 NOTE — Progress Notes (Signed)
Patient returned my call.  States she is not having any breast pain, but just wants to make sure she is getting all the appropriate follow up.  States she is not sure what is normal for her breast since her surgery.  States she has a follow up appointment scheduled with Dr. Grayland Ormond, but missed her annual appointment with Dr.Byrnett.  Informed her to call Dr. Dwyane Luo office to schedule her next appointment, and to keep all follow up with Dr. Grayland Ormond.  She is agreeable.  She is to call with any questions or needs.

## 2018-09-16 NOTE — Progress Notes (Signed)
  Oncology Nurse Navigator Documentation  Navigator Location: [facility] (09/16/18 0900)   )Navigator Encounter Type: Telephone (09/16/18 0900) Telephone: Lahoma Crocker Call (09/16/18 0900)                       Barriers/Navigation Needs: Coordination of Care (09/16/18 0900)                          Time Spent with Patient: 15 (09/16/18 0900)   Received message that the patient had been referred back to the cancer center by her PCP for breast pain. Patient has undergone bilateral  mastecomy and reconstruction. I have left the patient a message to return my call.  Will assess her breast pain and recommend calling her plastic surgeon for full assessment.

## 2019-01-10 NOTE — Progress Notes (Signed)
[place]  Telephone:(336) [phone] Fax:(336) [phone]  ID: Kelsey Klein OB: [birth year]  MR#: [id number]  KGY#:[id number]  Patient Care Team: [facility], [place] as PCP - General (General Practice) Rico Junker, RN as Registered Nurse  CHIEF COMPLAINT: Pathologic stage Ia ER-PR positive, HER-2 negative invasive lower outer quadrant the left breast.  INTERVAL HISTORY: Patient returns to clinic today for routine 59-monthevaluation.  She has completed her reconstructive surgery, but now states she may have her implants removed secondary to constant discomfort.  She otherwise feels well.  She continues to tolerate tamoxifen without significant side effects.  She has no neurologic complaints.  She denies any recent fevers or illnesses.  She has a good appetite and denies weight loss.  She denies any chest pain, shortness of breath, cough, or hemoptysis.  She denies any nausea, vomiting, constipation, or diarrhea.  She has no urinary complaints.  Patient offers no further specific complaints today.  REVIEW OF SYSTEMS:   Review of Systems  Constitutional: Negative.  Negative for fever, malaise/fatigue and weight loss.  Respiratory: Negative.  Negative for cough and shortness of breath.   Cardiovascular: Negative.  Negative for chest pain and leg swelling.  Gastrointestinal: Negative.  Negative for abdominal pain and constipation.  Genitourinary: Negative.  Negative for dysuria.  Musculoskeletal: Negative.  Negative for back pain.  Skin: Negative.  Negative for rash.  Neurological: Negative.  Negative for focal weakness, weakness and headaches.  Psychiatric/Behavioral: Negative.  The patient is not nervous/anxious.     As per HPI. Otherwise, a complete review of systems is negative.  PAST MEDICAL HISTORY: Past Medical History:  Diagnosis Date  . Breast cancer, stage 1, estrogen receptor negative, right [place] 08/19/2015    18 mm; pT1c  pN0  (0/6nodes positive). ER/PR +; Her 2 neu not overexpressed  . Family history of breast cancer     PAST SURGICAL HISTORY: Past Surgical History:  Procedure Laterality Date  . 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 BREAST BIOPSY Left 05/27/2017   Affirm Bx- DCIS  . BREAST RECONSTRUCTION WITH PLACEMENT OF TISSUE EXPANDER AND FLEX HD (ACELLULAR HYDRATED DERMIS) Bilateral 08/18/2017   Procedure: BREAST RECONSTRUCTION WITH PLACEMENT OF TISSUE EXPANDER AND FLEX HD (ACELLULAR HYDRATED DERMIS);  Surgeon: DWallace Going DO;  Location: [facility];  Service: Plastics;  Laterality: Bilateral;  . LIPOSUCTION WITH LIPOFILLING Bilateral 05/21/2018   Procedure: liposuction with fat grafting and revolve;  Surgeon: DWallace Going DO;  [place]  Service: Plastics;  Laterality: Bilateral;  . MASTECTOMY W/ SENTINEL NODE BIOPSY Bilateral 08/18/2017   Procedure: MASTECTOMY WITH SENTINEL LYMPH NODE BIOPSY,FROZEN SECTION;  Surgeon: BRobert Bellow MD;  Location: [facility];  Service: General;  Laterality: Bilateral;  . REMOVAL OF TISSUE EXPANDER AND PLACEMENT OF IMPLANT Bilateral 12/10/2017   Procedure: REMOVAL OF TISSUE EXPANDERS AND PLACEMENT OF IMPLANTS;  Surgeon: DWallace Going DO;  [place]  Service: Plastics;  Laterality: Bilateral;    FAMILY HISTORY: Family History  Problem Relation Age of Onset  . Arthritis Mother   . Cirrhosis Father        deceased 570 adopted  . Hypertension Father   . Hypertension Sister   . Alzheimer's disease Maternal Grandmother   . Breast cancer Paternal Aunt        age at dx unknown  . Breast cancer Paternal Aunt  age at dx unknown    ADVANCED DIRECTIVES (Y/N):  N  HEALTH MAINTENANCE: Social History   Tobacco Use  . Smoking status: Never Smoker  . Smokeless tobacco: Never Used  Substance Use Topics  . Alcohol use: Not Currently  .  Drug use: No     Colonoscopy:  PAP:  Bone density:  Lipid panel:  No Known Allergies  Current Outpatient Medications  Medication Sig Dispense Refill  . tamoxifen (NOLVADEX) 20 MG tablet Take 1 tablet (20 mg total) by mouth daily. 90 tablet 3   No current facility-administered medications for this visit.     OBJECTIVE: Vitals:   01/12/19 1053  BP: 107/75  Pulse: 73  Temp: 97.7 F (36.5 C)     Body mass index is 25.51 kg/m.    ECOG FS:0 - Asymptomatic  General: Well-developed, well-nourished, no acute distress. Eyes: Pink conjunctiva, anicteric sclera. HEENT: Normocephalic, moist mucous membranes. Breast: Bilateral mastectomy with reconstruction. Lungs: Clear to auscultation bilaterally. Heart: Regular rate and rhythm. No rubs, murmurs, or gallops. Abdomen: Soft, nontender, nondistended. No organomegaly noted, normoactive bowel sounds. Musculoskeletal: No edema, cyanosis, or clubbing. Neuro: Alert, answering all questions appropriately. Cranial nerves grossly intact. Skin: No rashes or petechiae noted. Psych: Normal affect.  LAB RESULTS:  No results found for: NA, K, CL, CO2, GLUCOSE, BUN, CREATININE, CALCIUM, PROT, ALBUMIN, AST, ALT, ALKPHOS, BILITOT, GFRNONAA, GFRAA  No results found for: WBC, NEUTROABS, HGB, HCT, MCV, PLT   STUDIES: No results found.  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  Oncotype DX score 13, low risk of recurrence.  PLAN:    1. 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  Although patient states she may consider having her implants removed secondary to persistent discomfort.  Because she had bilateral mastectomy, she did not require adjuvant XRT.  She had a low risk Oncotype score therefore chemotherapy was unnecessary.  Continue tamoxifen for a total  of 5 years completing in April 2024.  She does not require any further mammograms.  Patient has requested less frequent follow-up, therefore will return to clinic in 1 year for routine evaluation. 2.  Breast reconstruction/discomfort: Continue follow-up with surgery as scheduled.  I spent a total of 20 minutes face-to-face with the patient of which greater than 50% of the visit was spent in counseling and coordination of care as detailed above.   Patient expressed understanding and was in agreement with this plan. She also understands that She can call clinic at any time with any questions, concerns, or complaints.   Cancer Staging Primary cancer of lower-outer quadrant of left breast [facility]) Staging form: Breast, AJCC 8th Edition - Clinical stage from 05/31/2017: Stage IB (cT2, cN0, cM0, G1, ER: Positive, PR: Positive, HER2: Negative) - Signed by Lloyd Huger, MD on 05/31/2017 - Pathologic stage from 09/26/2017: Stage IA (pT1c, pN0, cM0, G2, ER+, PR+, HER2-, Oncotype DX score: 13) - Signed by Lloyd Huger, MD on 09/26/2017   Lloyd Huger, MD   01/14/2019 7:00 AM

## 2019-01-11 ENCOUNTER — Other Ambulatory Visit: Payer: Self-pay

## 2019-01-12 ENCOUNTER — Telehealth: Payer: Self-pay

## 2019-01-12 ENCOUNTER — Inpatient Hospital Stay: Payer: Medicaid Other | Attending: Oncology | Admitting: Oncology

## 2019-01-12 ENCOUNTER — Other Ambulatory Visit: Payer: Self-pay

## 2019-01-12 ENCOUNTER — Encounter: Payer: Self-pay | Admitting: Oncology

## 2019-01-12 VITALS — BP 107/75 | HR 73 | Temp 97.7°F | Wt 144.0 lb

## 2019-01-12 DIAGNOSIS — Z79810 Long term (current) use of selective estrogen receptor modulators (SERMs): Secondary | ICD-10-CM | POA: Diagnosis not present

## 2019-01-12 DIAGNOSIS — Z9013 Acquired absence of bilateral breasts and nipples: Secondary | ICD-10-CM | POA: Insufficient documentation

## 2019-01-12 DIAGNOSIS — C50512 Malignant neoplasm of lower-outer quadrant of left female breast: Secondary | ICD-10-CM | POA: Diagnosis present

## 2019-01-12 DIAGNOSIS — Z170 Estrogen receptor positive status [ER+]: Secondary | ICD-10-CM | POA: Diagnosis not present

## 2019-01-12 MED ORDER — TAMOXIFEN CITRATE 20 MG PO TABS: 20.0000 mg | ORAL_TABLET | Freq: Every day | ORAL | 3 refills | Status: DC

## 2019-01-12 NOTE — Progress Notes (Signed)
Patient stated that she had been having bilateral breast pain. Patient would like to see her general surgeon to have a bilateral mastectomy. Patient also wants to know if she could have a year supply of her Tamoxifen.

## 2019-01-12 NOTE — Telephone Encounter (Signed)
Spoke with patient at this time, however she did not have her appointment calendar with her so patient stated she would call back and speak to myself or emily to schedule a consult with dr.pabon for Bilateral mastectomy.

## 2019-02-03 ENCOUNTER — Other Ambulatory Visit: Payer: Self-pay

## 2019-02-03 ENCOUNTER — Ambulatory Visit (INDEPENDENT_AMBULATORY_CARE_PROVIDER_SITE_OTHER): Payer: Medicaid Other | Admitting: Surgery

## 2019-02-03 ENCOUNTER — Encounter: Payer: Self-pay | Admitting: Surgery

## 2019-02-03 VITALS — BP 119/84 | HR 61 | Temp 97.5°F | Ht 63.0 in | Wt 144.0 lb

## 2019-02-03 DIAGNOSIS — Z170 Estrogen receptor positive status [ER+]: Secondary | ICD-10-CM | POA: Diagnosis not present

## 2019-02-03 DIAGNOSIS — C50512 Malignant neoplasm of lower-outer quadrant of left female breast: Secondary | ICD-10-CM | POA: Diagnosis not present

## 2019-02-03 NOTE — Patient Instructions (Addendum)
Patient to plastic surgery . We will call your with an Doctor.

## 2019-02-05 NOTE — Progress Notes (Signed)
Outpatient Surgical Follow Up  02/05/2019  Kelsey Klein is an 48 y.o. female.   Chief Complaint  Patient presents with  . Other    HPI: Kelsey Klein is a 48 year old female with a history of breast cancer status post bilateral mastectomy with immediate reconstruction 08/2017.   Her main issue is discomfort from her breast implants.  She reports that they are heavy causes some chest wall discomfort and pain that is intermittent mild to moderate intensity.  He is very tired and disappointed about the breast reconstruction and expresses that she wish those implants removed.  Past Medical History:  Diagnosis Date  . Breast cancer, stage 1, estrogen receptor negative, right ([place]) 08/19/2015    18 mm; pT1c  pN0 (0/6nodes positive). ER/PR +; Her 2 neu not overexpressed  . Family history of breast cancer     Past Surgical History:  Procedure Laterality Date  . 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 BREAST BIOPSY Left 05/27/2017   Affirm Bx- DCIS  . BREAST RECONSTRUCTION WITH PLACEMENT OF TISSUE EXPANDER AND FLEX HD (ACELLULAR HYDRATED DERMIS) Bilateral 08/18/2017   Procedure: BREAST RECONSTRUCTION WITH PLACEMENT OF TISSUE EXPANDER AND FLEX HD (ACELLULAR HYDRATED DERMIS);  Surgeon: Wallace Going, DO;  Location: [facility];  Service: Plastics;  Laterality: Bilateral;  . LIPOSUCTION WITH LIPOFILLING Bilateral 05/21/2018   Procedure: liposuction with fat grafting and revolve;  Surgeon: Wallace Going, DO;  Location: [place];  Service: Plastics;  Laterality: Bilateral;  . MASTECTOMY W/ SENTINEL NODE BIOPSY Bilateral 08/18/2017   Procedure: MASTECTOMY WITH SENTINEL LYMPH NODE BIOPSY,FROZEN SECTION;  Surgeon: Robert Bellow, MD;  Location: [facility];  Service: General;  Laterality: Bilateral;  . REMOVAL OF TISSUE EXPANDER AND PLACEMENT OF IMPLANT Bilateral 12/10/2017   Procedure: REMOVAL OF TISSUE EXPANDERS AND PLACEMENT  OF IMPLANTS;  Surgeon: Wallace Going, DO;  Location: [facility];  Service: Plastics;  Laterality: Bilateral;    Family History  Problem Relation Age of Onset  . Arthritis Mother   . Cirrhosis Father        deceased 36; adopted  . Hypertension Father   . Hypertension Sister   . Alzheimer's disease Maternal Grandmother   . Breast cancer Paternal Aunt        age at dx unknown  . Breast cancer Paternal Aunt        age at dx unknown    Social History:  reports that she has never smoked. She has never used smokeless tobacco. She reports previous alcohol use. She reports that she does not use drugs.  Allergies: No Known Allergies  Medications reviewed.    ROS Full ROS performed and is otherwise negative other than what is stated in HPI   BP 119/84   Pulse 61   Temp (!) 97.5 F (36.4 C) (Skin)   Ht 5' 3"  (1.6 m)   Wt 144 lb (65.3 kg)   SpO2 98%   BMI 25.51 kg/m   Physical Exam Vitals signs and nursing note reviewed. Exam conducted with a chaperone present.  Constitutional:      General: She is not in acute distress.    Appearance: Normal appearance. She is normal weight.  Cardiovascular:     Rate and Rhythm: Normal rate.     Pulses: Normal pulses.     Heart sounds: No murmur.  Pulmonary:     Effort: Pulmonary effort is normal. No respiratory distress.  Breath sounds: Normal breath sounds. No stridor.     Comments: Breast: Breast implants there is no evidence of infection there is no evidence of seroma there is no evidence of lymphadenopathy or new evidence of suspicious lesions within the chest wall or the axilla Skin:    General: Skin is warm and dry.  Neurological:     Mental Status: She is alert.  Psychiatric:        Mood and Affect: Mood normal.        Behavior: Behavior normal.        Thought Content: Thought content normal.       Assessment/Plan: Breast Ca s/p breast reconstructions.  We will arrange plastic surgery referral for  excision of implants per patient wishes.  From a general surgery perspective and breast surgery perspective there is no need for any further biopsies or surgical interventions at this time    Greater than 50% of the 25 minutes  visit was spent in counseling/coordination of care   Caroleen Hamman, MD [place] Surgeon

## 2019-03-23 ENCOUNTER — Institutional Professional Consult (permissible substitution): Payer: Medicaid Other | Admitting: Plastic Surgery

## 2019-05-03 ENCOUNTER — Telehealth: Payer: Self-pay | Admitting: Plastic Surgery

## 2019-05-03 NOTE — Telephone Encounter (Signed)

## 2019-05-04 ENCOUNTER — Encounter: Payer: Self-pay | Admitting: Plastic Surgery

## 2019-05-04 ENCOUNTER — Other Ambulatory Visit: Payer: Self-pay

## 2019-05-04 ENCOUNTER — Ambulatory Visit (INDEPENDENT_AMBULATORY_CARE_PROVIDER_SITE_OTHER): Payer: Medicaid Other | Admitting: Plastic Surgery

## 2019-05-04 VITALS — BP 119/79 | HR 83 | Temp 97.6°F | Ht 63.0 in | Wt 144.0 lb

## 2019-05-04 DIAGNOSIS — Z9889 Other specified postprocedural states: Secondary | ICD-10-CM

## 2019-05-04 DIAGNOSIS — N644 Mastodynia: Secondary | ICD-10-CM | POA: Insufficient documentation

## 2019-05-04 NOTE — Progress Notes (Signed)
Patient ID: Kelsey Klein, female    DOB: May 05, 1971, 48 y.o.   MRN: [id number]   Chief Complaint  Patient presents with  . Skin Problem    The patient is a 48 year old female here for evaluation of her breasts.  She underwent bilateral mastectomies with reconstruction.  She had expanders placed followed by implants.  She is very pleased with the way they look.  She does look very good.  Is a good size for her she is concerned about the way that the implants feel.  She thinks that they feel like bricks on top of her.  She does not have a sensation that she is keenly aware of that.  On exam she does not have any discernible capsular contraction they are slightly tender.  They are soft.  There does not appear to be any sign of rupture, infection, redness or capsular contracture.  She is concerned about being flat.  She is fairly thin lady so autologous reconstruction would be difficult.   Review of Systems  Constitutional: Negative for activity change and appetite change.  Respiratory: Negative.  Negative for chest tightness.   Cardiovascular: Negative.  Negative for leg swelling.  Gastrointestinal: Negative.  Negative for abdominal pain.  Endocrine: Negative.   Genitourinary: Negative.   Musculoskeletal: Negative.   Neurological: Negative.   Psychiatric/Behavioral: Negative.     Past Medical History:  Diagnosis Date  . Breast cancer, stage 1, estrogen receptor negative, right ([place]) 08/19/2015    18 mm; pT1c  pN0 (0/6nodes positive). ER/PR +; Her 2 neu not overexpressed  . Family history of breast cancer     Past Surgical History:  Procedure Laterality Date  . 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 BREAST BIOPSY Left 05/27/2017   Affirm Bx- DCIS  . BREAST RECONSTRUCTION WITH PLACEMENT OF TISSUE EXPANDER AND FLEX HD (ACELLULAR HYDRATED DERMIS) Bilateral 08/18/2017   Procedure: BREAST RECONSTRUCTION WITH PLACEMENT OF TISSUE  EXPANDER AND FLEX HD (ACELLULAR HYDRATED DERMIS);  Surgeon: Wallace Going, DO;  Location: [facility];  Service: Plastics;  Laterality: Bilateral;  . LIPOSUCTION WITH LIPOFILLING Bilateral 05/21/2018   Procedure: liposuction with fat grafting and revolve;  Surgeon: Wallace Going, DO;  Location: [place];  Service: Plastics;  Laterality: Bilateral;  . MASTECTOMY W/ SENTINEL NODE BIOPSY Bilateral 08/18/2017   Procedure: MASTECTOMY WITH SENTINEL LYMPH NODE BIOPSY,FROZEN SECTION;  Surgeon: Robert Bellow, MD;  Location: [facility];  Service: General;  Laterality: Bilateral;  . REMOVAL OF TISSUE EXPANDER AND PLACEMENT OF IMPLANT Bilateral 12/10/2017   Procedure: REMOVAL OF TISSUE EXPANDERS AND PLACEMENT OF IMPLANTS;  Surgeon: Wallace Going, DO;  Location: [facility];  Service: Plastics;  Laterality: Bilateral;      Current Outpatient Medications:  .  tamoxifen (NOLVADEX) 20 MG tablet, Take 1 tablet (20 mg total) by mouth daily., Disp: 90 tablet, Rfl: 3   Objective:   Vitals:   05/04/19 1058  BP: 119/79  Pulse: 83  Temp: 97.6 F (36.4 C)  SpO2: 100%    Physical Exam Vitals signs and nursing note reviewed.  Constitutional:      Appearance: Normal appearance.  HENT:     Head: Normocephalic and atraumatic.  Eyes:     Extraocular Movements: Extraocular movements intact.  Cardiovascular:     Rate and Rhythm: Normal rate.     Pulses: Normal pulses.  Pulmonary:     Effort:  Pulmonary effort is normal. No respiratory distress.  Abdominal:     General: Abdomen is flat. There is no distension.     Tenderness: There is no abdominal tenderness.  Musculoskeletal: Normal range of motion.  Skin:    General: Skin is warm.     Capillary Refill: Capillary refill takes less than 2 seconds.  Neurological:     General: No focal deficit present.     Mental Status: She is alert and oriented to person, place, and time.  Psychiatric:        Mood and  Affect: Mood normal.        Behavior: Behavior normal.        Thought Content: Thought content normal.     Assessment & Plan:  Status post bilateral breast reconstruction - Plan: Ambulatory referral to Physical Therapy  Breast pain in female - Plan: Ambulatory referral to Physical Therapy  We discussed options for removal of implants.  The patient would like to avoid complete removal if possible.  This certainly is an option if at any point she feels it is not tolerable.  She agreed to physical therapy to see if we can do some desensitizing to her breast and chest wall area.  She committed to come back and see [place] after she is finished the physical therapy.   Pictures were obtained of the patient and placed in the chart with the patient's or guardian's permission.   [place], DO

## 2019-05-26 ENCOUNTER — Other Ambulatory Visit: Payer: Self-pay

## 2019-05-26 ENCOUNTER — Ambulatory Visit: Payer: Medicaid Other | Attending: Plastic Surgery | Admitting: Physical Therapy

## 2019-05-26 ENCOUNTER — Encounter: Payer: Self-pay | Admitting: Physical Therapy

## 2019-05-26 DIAGNOSIS — R293 Abnormal posture: Secondary | ICD-10-CM | POA: Insufficient documentation

## 2019-05-26 DIAGNOSIS — M25611 Stiffness of right shoulder, not elsewhere classified: Secondary | ICD-10-CM | POA: Diagnosis not present

## 2019-05-26 NOTE — Therapy (Signed)
[facility] [address] [place], [address] Phone: [phone]   Fax:  [phone]  Physical Therapy Evaluation  Patient Details  Name: Kelsey Klein MRN: [id number] Date of Birth: [birth year] No data recorded  Encounter Date: 05/26/2019  PT End of Session - 05/26/19 1035    Visit Number  1    Number of Visits  13    Date for PT Re-Evaluation  07/08/19    PT Start Time  7169    PT Stop Time  1100    PT Time Calculation (min)  45 min    Activity Tolerance  Patient tolerated treatment well    Behavior During Therapy  [facility] for tasks assessed/performed       Past Medical History:  Diagnosis Date  . Breast cancer, stage 1, estrogen receptor negative, right ([place]) 08/19/2015    18 mm; pT1c  pN0 (0/6nodes positive). ER/PR +; Her 2 neu not overexpressed  . Family history of breast cancer     Past Surgical History:  Procedure Laterality Date  . 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 BREAST BIOPSY Left 05/27/2017   Affirm Bx- DCIS  . BREAST RECONSTRUCTION WITH PLACEMENT OF TISSUE EXPANDER AND FLEX HD (ACELLULAR HYDRATED DERMIS) Bilateral 08/18/2017   Procedure: BREAST RECONSTRUCTION WITH PLACEMENT OF TISSUE EXPANDER AND FLEX HD (ACELLULAR HYDRATED DERMIS);  Surgeon: Wallace Going, DO;  Location: [facility];  Service: Plastics;  Laterality: Bilateral;  . LIPOSUCTION WITH LIPOFILLING Bilateral 05/21/2018   Procedure: liposuction with fat grafting and revolve;  Surgeon: Wallace Going, DO;  Location: [place];  Service: Plastics;  Laterality: Bilateral;  . MASTECTOMY W/ SENTINEL NODE BIOPSY Bilateral 08/18/2017   Procedure: MASTECTOMY WITH SENTINEL LYMPH NODE BIOPSY,FROZEN SECTION;  Surgeon: Robert Bellow, MD;  Location: [facility];  Service: General;  Laterality: Bilateral;  . REMOVAL OF TISSUE EXPANDER AND PLACEMENT OF IMPLANT  Bilateral 12/10/2017   Procedure: REMOVAL OF TISSUE EXPANDERS AND PLACEMENT OF IMPLANTS;  Surgeon: Wallace Going, DO;  Location: [place];  Service: Plastics;  Laterality: Bilateral;    There were no vitals filed for this visit.   Subjective Assessment - 05/26/19 1022    Subjective  Bilat chest pain    Pertinent History  Patient is a 48 year old female presenting post double masectomy March 2019, with bilat expander placement, and bilat reconstruction 12/14/18, with subsequent fat grating in Dec 2019. Has had sharp spasm pain in bilat upper chest. Reports this is happening less frequently now. Reports this pain is like a twitch that lasts a second or two bilat upper and lateral breasts; does have heaviness/discomfort as well. Worst pain 6/10, best 0/10. Reports most discomfort with leaning forward to pick up something, but can have "sharp twitching pain" anytime. Pain ceases with rest. Patient is a Emergency planning/management officer, works full time. Pt denies N/V, B&B changes, unexplained weight fluctuation, saddle paresthesia, fever, night sweats, or unrelenting night pain at this time.    Limitations  House hold activities;Lifting    How long can you sit comfortably?  unlimited    How long can you stand comfortably?  unlimited    How long can you walk comfortably?  unlimited    Diagnostic tests  None    Patient Stated Goals  Be able to not wear a bra without discomfort    Currently in Pain?  Yes  Pain Score  0-No pain    Pain Location  Chest    Pain Orientation  Right;Left    Pain Descriptors / Indicators  Heaviness;Discomfort    Pain Type  Surgical pain    Pain Radiating Towards  None    Pain Onset  More than a month ago    Pain Frequency  Intermittent    Aggravating Factors   bending forward, not wearing a bra    Pain Relieving Factors  Rest    Effect of Pain on Daily Activities  unable to bend forward to tie shoes, has to wear multiple bras for support           OBJECTIVE  MUSCULOSKELETAL: Tremor: Normal Bulk: Normal Tone: Normal  Cervical Screen All cervical motion WNL without pain  Elbow Screen Elbow AROM:  Palpation TTP with withdrawal response to R pec minor near insertion, trigger points to bilat pec musculature. Sensitivity to bilat lateral breasts R>L   Posture: slight rounded shoulders, forward head, good ability to adjust cervical neutral with min cuing  Strength R/L 5/5 Shoulder flexion (anterior deltoid/pec major/coracobrachialis, axillary n. (C5-6) and musculocutaneous n. (C5-7)) 5/5 Shoulder abduction (deltoid/supraspinatus, axillary/suprascapular n, C5) 5/5 Shoulder external rotation (infraspinatus/teres minor) 5/5 Shoulder internal rotation (subcapularis/lats/pec major) 5/5 Shoulder extension (posterior deltoid, lats, teres major, axillary/thoracodorsal n.) 5/5 Elbow flexion (biceps brachii, brachialis, brachioradialis, musculoskeletal n, C5-6) 5/5 Elbow extension (triceps, radial n, C7) 5/5 Wrist Extension 5/5 Wrist Flexion 5/5 Finger adduction (interossei, ulnar n, T1) 4-/4- Y lower trap 4/4 T midtap/scap retractors 5/5 I Position 4/4 Latissimus  AROM R/L 170/180 Shoulder flexion with discomfort at R lateral breast with RUE flexion 180/180 Shoulder abduction T12/T10 Shoulder external rotation C8/C8 Shoulder internal rotation 60/60 Shoulder extension *Indicates pain, overpressure performed unless otherwise indicated  PROM All shoulder and cervical PROM WNL  Accessory Motions/Glides Glenohumeral: WNL all directions  Acromioclavicular:  WNL all directions  Sternoclavicular: WNL all directions  Scapulothoracic: WNL all directions   Sensation Grossly intact to light touch bilateral UE as determined by testing dermatomes C2-T2 Proprioception and hot/cold testing deferred on this date Decreased sensation at ant implants, reports it feels more dull than other parts of the  chest   Ther-Ex Education on desensitizing implants with visual and tactile input. Education on posture and length tension relationship and lengthening ant chest musculature, and strengthening/activating periscapular musculature. Prone Y x10 with cuing initially for scapular retraction with good carry over Doorway pec stretch in 90/90 2x 30sec hold             Objective measurements completed on examination: See above findings.              PT Education - 05/26/19 1033    Education Details  Patient was educated on diagnosis, anatomy and pathology involved, prognosis, role of PT, and was given an HEP, demonstrating exercise with proper form following verbal and tactile cues, and was given a paper hand out to continue exercise at home. Pt was educated on and agreed to plan of care.    Person(s) Educated  Patient    Methods  Explanation;Demonstration;Tactile cues;Verbal cues;Handout    Comprehension  Verbalized understanding;Returned demonstration;Verbal cues required;Tactile cues required       PT Short Term Goals - 05/26/19 1106      PT SHORT TERM GOAL #1   Title  Pt will be independent with HEP in order to improve strength and decrease pain in order to improve pain-free function at home and work.  Baseline  05/26/19 HEP given    Time  4    Period  Weeks    Status  New        PT Long Term Goals - 05/26/19 1110      PT LONG TERM GOAL #1   Title  Pt will decrease worst pain as reported on NPRS by at least 3 points in order to demonstrate clinically significant reduction in pain.    Baseline  05/26/19 6/10 pain with forward bending and with spasms that occur    Time  6    Period  Weeks    Status  New      PT LONG TERM GOAL #2   Title  [place] will demonstrate gross periscapular strength 4+/5 to demonstrate a clinically significant increase in strength needed to complete overhead activity    Baseline  05/26/19 4- bilat Y; 4 bilat T; 4 bilat latissimus    Time   6    Period  Weeks    Status  New      PT LONG TERM GOAL #4   Title  Patient will demonstrate full R shoulder AROM without pain or discomfort in order to complete household chores    Baseline  05/26/19 flex 170d with discomfort at end range; ABD WNL; ER WNL; IR to T12 (LUE to T10)    Time  6    Period  Weeks    Status  New             Plan - 05/26/19 1131    Clinical Impression Statement  Patient is a 48 year old female presenting with bilat chest pain following bilat masectomy with bilat reconstruction June 2019. Impairments in posture, pain, sensory deficits, ant muscle tension, and decreased periscapular strength. Activity limitations in forward bending, overhead reaching, and lifting; inhibiting full participation in ADLs (tying shoes, household chores) and work as a Theme park manager. Would benefit from skilled PT to address above deficits and promote optimal return to PLOF.    Personal Factors and Comorbidities  Sex;Behavior Pattern;Comorbidity 1;Past/Current Experience;Time since onset of injury/illness/exacerbation    Comorbidities  Hx breast cancer 1B in R breast    Examination-Activity Limitations  Lift;Carry;Reach Overhead;Bend    Examination-Participation Restrictions  Art gallery manager;Yard Work    Merchant navy officer  Evolving/Moderate complexity    Clinical Decision Making  Moderate    Rehab Potential  Good    PT Frequency  2x / week    PT Duration  8 weeks    PT Treatment/Interventions  ADLs/Self Care Home Management;Cryotherapy;Dry needling;Joint Manipulations;Spinal Manipulations;Passive range of motion;Manual techniques;Patient/family education;Therapeutic exercise;Moist Heat;Electrical Stimulation;Functional mobility training;Therapeutic activities;Neuromuscular re-education    PT Next Visit Plan  periscapular strengthening, posture,    PT Home Exercise Plan  Doorway stretch 30sec hold; Prone Y 2x 10; implant desensitization    Consulted and  Agree with Plan of Care  Patient       Patient will benefit from skilled therapeutic intervention in order to improve the following deficits and impairments:  Decreased mobility, Increased muscle spasms, Impaired sensation, Decreased range of motion, Impaired tone, Improper body mechanics, Decreased activity tolerance, Decreased strength, Increased fascial restricitons, Impaired flexibility, Impaired UE functional use, Postural dysfunction, Pain  Visit Diagnosis: Stiffness of right shoulder, not elsewhere classified  Abnormal posture     Problem List Patient Active Problem List   Diagnosis Date Noted  . Breast pain in female 05/04/2019  . Breast asymmetry following reconstructive surgery 03/18/2018  . Status post bilateral breast  reconstruction 12/31/2017  . Acquired absence of bilateral breasts and nipples 09/08/2017  . Breast cancer in female [facility]) 08/18/2017  . Family history of breast cancer   . Malignant neoplasm of overlapping sites of left female breast ([place]) 07/01/2017  . Primary cancer of lower-outer quadrant of left breast ([place]) 05/31/2017   Shelton Silvas PT, DPT Shelton Silvas 05/26/2019, 11:53 AM  [facility] [address], [place], [address] Phone: [phone]   Fax:  [phone]  Name: Kelsey Klein MRN: [id number] Date of Birth: [birth year]

## 2019-06-02 ENCOUNTER — Ambulatory Visit: Payer: Medicaid Other | Admitting: Physical Therapy

## 2019-06-07 ENCOUNTER — Ambulatory Visit: Payer: Medicaid Other | Admitting: Physical Therapy

## 2019-06-09 ENCOUNTER — Other Ambulatory Visit: Payer: Self-pay

## 2019-06-09 ENCOUNTER — Encounter: Payer: Self-pay | Admitting: Physical Therapy

## 2019-06-09 ENCOUNTER — Ambulatory Visit: Payer: Medicaid Other | Admitting: Physical Therapy

## 2019-06-09 DIAGNOSIS — R293 Abnormal posture: Secondary | ICD-10-CM

## 2019-06-09 DIAGNOSIS — M25611 Stiffness of right shoulder, not elsewhere classified: Secondary | ICD-10-CM

## 2019-06-09 NOTE — Therapy (Signed)
[facility] [address], [place], [address] Phone: [phone]   Fax:  [phone]  Physical Therapy Treatment  Patient Details  Name: Kelsey Klein MRN: [id number] Date of Birth: 1970/11/06 No data recorded  Encounter Date: 06/09/2019  PT End of Session - 06/09/19 0934    Visit Number  2    Number of Visits  13    Date for PT Re-Evaluation  07/08/19    Authorization - Visit Number  1    Authorization - Number of Visits  3    PT Start Time  0903    PT Stop Time  0945    PT Time Calculation (min)  42 min    Activity Tolerance  Patient tolerated treatment well    Behavior During Therapy  [facility] for tasks assessed/performed       Past Medical History:  Diagnosis Date  . Breast cancer, stage 1, estrogen receptor negative, right ([place]) 08/19/2015    18 mm; pT1c  pN0 (0/6nodes positive). ER/PR +; Her 2 neu not overexpressed  . Family history of breast cancer     Past Surgical History:  Procedure Laterality Date  . 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 BREAST BIOPSY Left 05/27/2017   Affirm Bx- DCIS  . BREAST RECONSTRUCTION WITH PLACEMENT OF TISSUE EXPANDER AND FLEX HD (ACELLULAR HYDRATED DERMIS) Bilateral 08/18/2017   Procedure: BREAST RECONSTRUCTION WITH PLACEMENT OF TISSUE EXPANDER AND FLEX HD (ACELLULAR HYDRATED DERMIS);  Surgeon: Wallace Going, DO;  Location: [facility];  Service: Plastics;  Laterality: Bilateral;  . LIPOSUCTION WITH LIPOFILLING Bilateral 05/21/2018   Procedure: liposuction with fat grafting and revolve;  Surgeon: Wallace Going, DO;  Location: [place];  Service: Plastics;  Laterality: Bilateral;  . MASTECTOMY W/ SENTINEL NODE BIOPSY Bilateral 08/18/2017   Procedure: MASTECTOMY WITH SENTINEL LYMPH NODE BIOPSY,FROZEN SECTION;  Surgeon: Robert Bellow, MD;  Location: [facility];  Service: General;   Laterality: Bilateral;  . REMOVAL OF TISSUE EXPANDER AND PLACEMENT OF IMPLANT Bilateral 12/10/2017   Procedure: REMOVAL OF TISSUE EXPANDERS AND PLACEMENT OF IMPLANTS;  Surgeon: Wallace Going, DO;  Location: [place];  Service: Plastics;  Laterality: Bilateral;    There were no vitals filed for this visit.  Subjective Assessment - 06/09/19 0905    Subjective  Patient reports she had a sharp pain on the L side of her chest yesterday, but it went away quickly. Reports she has no pain right now. Compliance with HEP.    Pertinent History  Patient is a 48 year old female presenting post double masectomy March 2019, with bilat expander placement, and bilat reconstruction 12/14/18, with subsequent fat grating in Dec 2019. Has had sharp spasm pain in bilat upper chest. Reports this is happening less frequently now. Reports this pain is like a twitch that lasts a second or two bilat upper and lateral breasts; does have heaviness/discomfort as well. Worst pain 6/10, best 0/10. Reports most discomfort with leaning forward to pick up something, but can have "sharp twitching pain" anytime. Pain ceases with rest. Patient is a Emergency planning/management officer, works full time. Pt denies N/V, B&B changes, unexplained weight fluctuation, saddle paresthesia, fever, night sweats, or unrelenting night pain at this time.    Limitations  House hold activities;Lifting    How long can you sit comfortably?  unlimited    How long can you stand  comfortably?  unlimited    How long can you walk comfortably?  unlimited    Diagnostic tests  None    Patient Stated Goals  Be able to not wear a bra without discomfort         Manual STM with trigger point release to bilat pec minor musculature, near insertion with good release. L>R Scar tissue massage L lymphnode incision site Passive flex with mobilization multiple bouts bilat L>R  Ther-Ex Supine overhead wand bilat shoulder flex in abd over half foam roll 10x 5sec hold  with good carry over following initial cuing.  Prone Y x10; with bilat 1# DB 2x 10 with min cuing for eccentric control and scapular retraction with good carry   Standing rows 15# 3x 10 with good carry over following demo and cuing for cervical  Doorway stretch 60sec hold                     PT Education - 06/09/19 0935    Education Details  therex form; postural education    Person(s) Educated  Patient    Methods  Explanation;Demonstration;Verbal cues    Comprehension  Verbalized understanding;Returned demonstration;Verbal cues required       PT Short Term Goals - 06/09/19 0906      PT SHORT TERM GOAL #1   Title  Pt will be independent with HEP in order to improve strength and decrease pain in order to improve pain-free function at home and work.    Baseline  06/09/19 Completing HEP with some cuing needed for proper technique    Time  4    Period  Weeks    Status  On-going        PT Long Term Goals - 06/09/19 0906      PT LONG TERM GOAL #1   Title  Pt will decrease worst pain as reported on NPRS by at least 3 points in order to demonstrate clinically significant reduction in pain.    Baseline  06/09/19 6/10 pain with sharp spasm pains at bilat chest musculature    Time  6    Period  Weeks    Status  On-going      PT LONG TERM GOAL #2   Title  Patinet will demonstrate gross periscapular strength 4+/5 to demonstrate a clinically significant increase in strength needed to complete overhead activity    Baseline  06/09/19 4- bilat Y; 4 bilat T; 4 bilat latissimus    Time  6    Period  Weeks    Status  On-going      PT LONG TERM GOAL #4   Title  Patient will demonstrate full R shoulder AROM without pain or discomfort in order to complete household chores    Baseline  06/09/19 flex 170d with discomfort at end range; ABD WNL; ER WNL; IR to T12 (LUE to T10)    Time  6    Period  Weeks    Status  On-going            Plan - 06/09/19 1012    Clinical  Impression Statement  PT utilized manaul techniques to decrease pec minor tension bilat with good success. Patient able to achieve more neutral posture following during therex following manual techniques. Patient is able to complete all therex with proper technique following cuing and demo. PT will continue progression as able with manual techniques as needed.    Personal Factors and Comorbidities  Sex;Behavior Pattern;Comorbidity 1;Past/Current Experience  Comorbidities  Hx breast cancer 1B in R breast    Examination-Activity Limitations  Lift;Carry;Reach Overhead;Bend    Examination-Participation Restrictions  Art gallery manager;Yard Work    Merchant navy officer  Evolving/Moderate complexity    Clinical Decision Making  Moderate    Rehab Potential  Good    PT Frequency  2x / week    PT Duration  8 weeks    PT Treatment/Interventions  ADLs/Self Care Home Management;Cryotherapy;Dry needling;Joint Manipulations;Spinal Manipulations;Passive range of motion;Manual techniques;Patient/family education;Therapeutic exercise;Moist Heat;Electrical Stimulation;Functional mobility training;Therapeutic activities;Neuromuscular re-education    PT Next Visit Plan  periscapular strengthening, posture,    PT Home Exercise Plan  Doorway stretch 30sec hold; Prone Y 2x 10; implant desensitization    Consulted and Agree with Plan of Care  Patient       Patient will benefit from skilled therapeutic intervention in order to improve the following deficits and impairments:  Decreased mobility, Increased muscle spasms, Impaired sensation, Decreased range of motion, Impaired tone, Improper body mechanics, Decreased activity tolerance, Decreased strength, Increased fascial restricitons, Impaired flexibility, Impaired UE functional use, Postural dysfunction, Pain  Visit Diagnosis: Stiffness of right shoulder, not elsewhere classified  Abnormal posture     Problem List Patient Active Problem  List   Diagnosis Date Noted  . Breast pain in female 05/04/2019  . Breast asymmetry following reconstructive surgery 03/18/2018  . Status post bilateral breast reconstruction 12/31/2017  . Acquired absence of bilateral breasts and nipples 09/08/2017  . Breast cancer in female [facility]) 08/18/2017  . Family history of breast cancer   . Malignant neoplasm of overlapping sites of left female breast ([place]) 07/01/2017  . Primary cancer of lower-outer quadrant of left breast ([place]) 05/31/2017   Shelton Silvas PT, DPT Shelton Silvas 06/09/2019, 10:23 AM  [facility] [address], [place], [address] Phone: [phone]   Fax:  [phone]  Name: Kelsey Klein MRN: [id number] Date of Birth: 10/25/1970

## 2019-06-16 ENCOUNTER — Encounter: Payer: Medicaid Other | Admitting: Physical Therapy

## 2019-06-21 ENCOUNTER — Ambulatory Visit: Payer: Medicaid Other | Admitting: Physical Therapy

## 2019-06-23 ENCOUNTER — Encounter: Payer: Medicaid Other | Admitting: Physical Therapy

## 2019-06-28 ENCOUNTER — Encounter: Payer: Medicaid Other | Admitting: Physical Therapy

## 2019-06-30 ENCOUNTER — Encounter: Payer: Medicaid Other | Admitting: Physical Therapy

## 2019-07-05 ENCOUNTER — Encounter: Payer: Medicaid Other | Admitting: Physical Therapy

## 2019-07-07 ENCOUNTER — Encounter: Payer: Medicaid Other | Admitting: Physical Therapy

## 2019-07-12 ENCOUNTER — Encounter: Payer: Medicaid Other | Admitting: Physical Therapy

## 2019-07-14 ENCOUNTER — Encounter: Payer: Medicaid Other | Admitting: Physical Therapy

## 2019-10-02 IMAGING — MG MM BREAST BX W LOC DEV 1ST LESION IMAGE BX SPEC STEREO GUIDE*L*
6 of 9 series · 6 of 25 positions shown · non-contrast
Comparison: Previous exams.

ADDENDUM:
Pathology of the left breast stereotactic guided biopsy revealed B.
BREAST, LEFT, LOWER INNER QUADRANT; STEREOTACTIC-GUIDED CORE BIOPSY:
DUCTAL CARCINOMA IN SITU (DCIS), HIGH NUCLEAR GRADE WITH
COMEDONECROSIS AND CALCIFICATIONS, IN MULTIPLE CORE SAMPLES.
Comment: These findings were communicated to Allysson Dau in
the [facility]HOSPITAL[facility] on 05/28/17 at [DATE]. Read-back
was performed.

This was found to be concordant with Dr. Rise impression and
notes.
Recommendation: Surgical and oncology referrals. Please note, clips
are 2.4 cm apart, likely within the same malignancy, and represent
the anterior and posterior portion of it. Bracketed NL will be
needed if excision is chosen. There is a separate group of
calcifications in the slightly UIQ of the left breast. A
stereotactic biopsy for it is recommended, if breast conservation
treatment is considered.
Results were relayed to Leshawn Shehata, RN, nurse navigator and
[REDACTED] coordinator for [HOSPITAL]. Results were
given to the patient in person by Leshawn Shehata, RN. A surgical
appointment was made for the patient with Dr. Jedidiash for 06/02/17
at [DATE] and oncology appointment with Dr. Go for 06/02/17
at [DATE]. The patient was encouraged to contact Leshawn Shehata,
RN with any further questions or concerns.
Addendum by Quoidbach, Serneels on 05/29/17.
CLINICAL DATA: Left breast lower inner quadrant group of
calcifications.
EXAM:
LEFT BREAST STEREOTACTIC CORE NEEDLE BIOPSY

[L (1 of 5)]
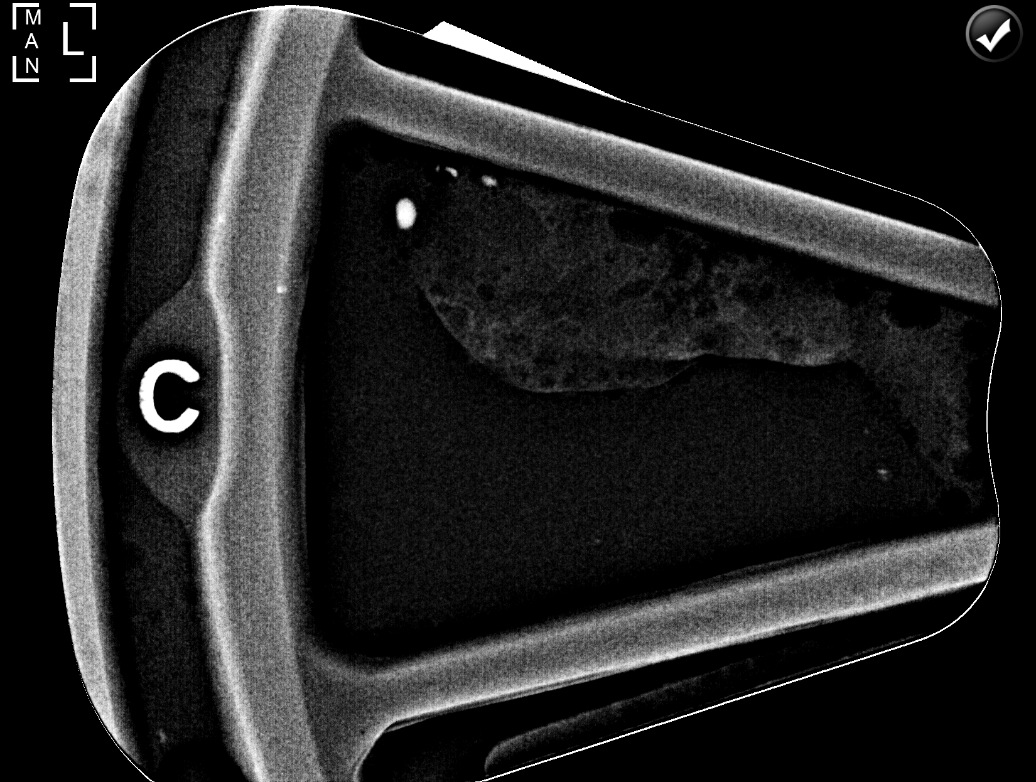

[L (2 of 5)]
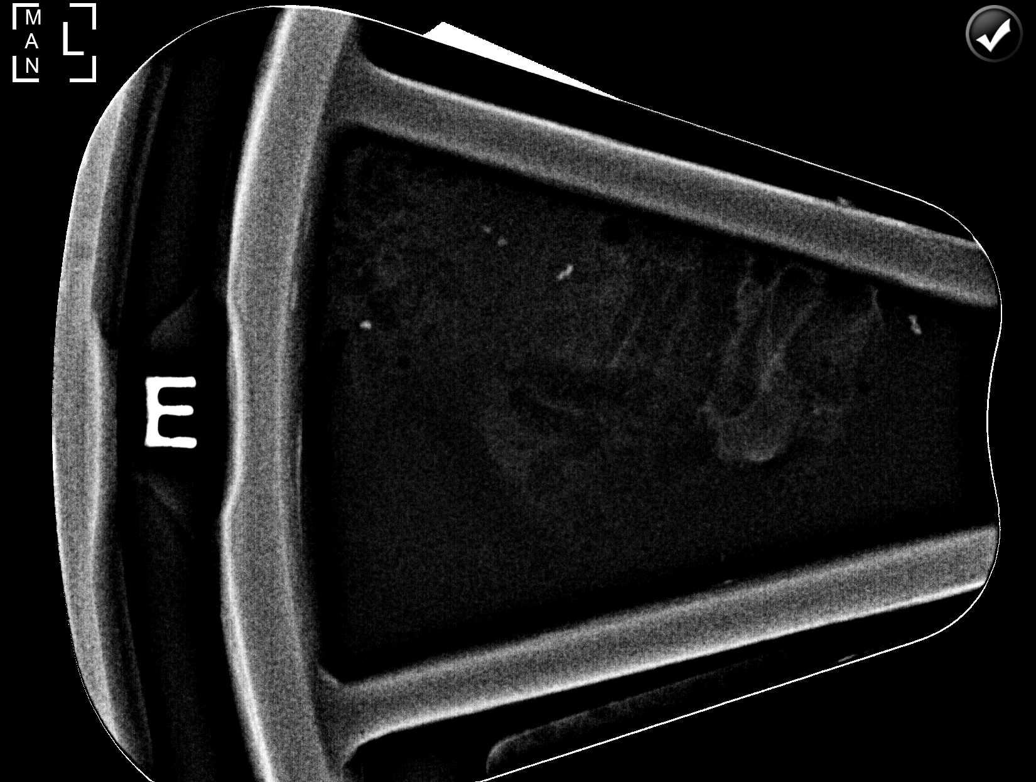

[L (3 of 5)]
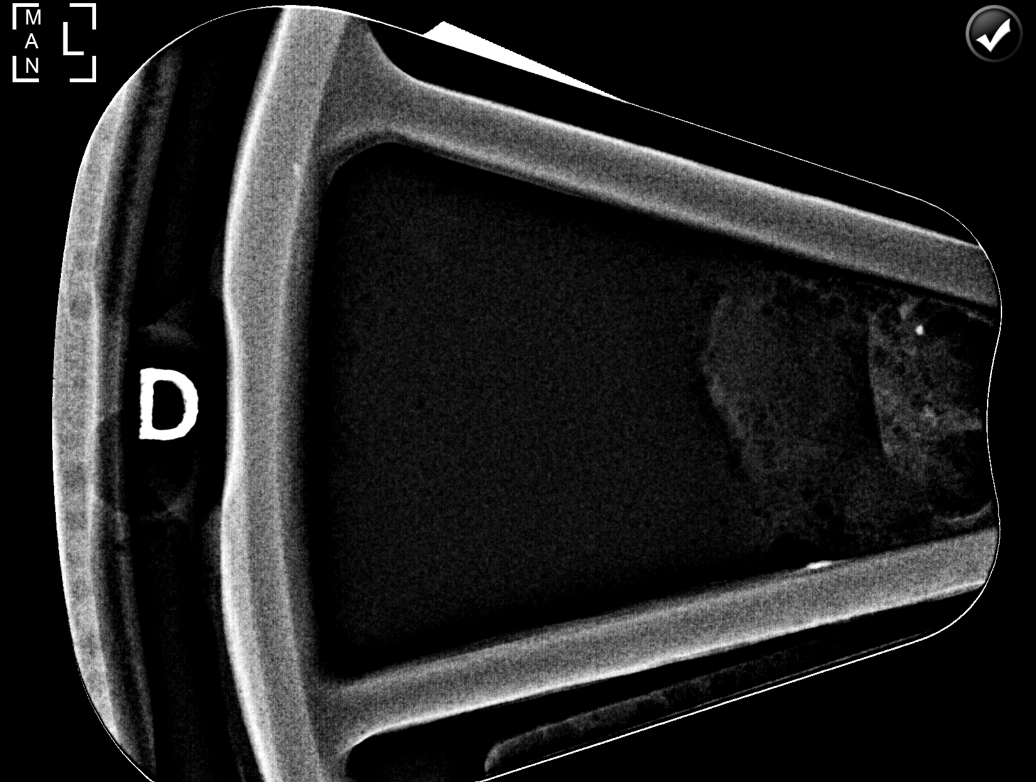

[L (4 of 5)]
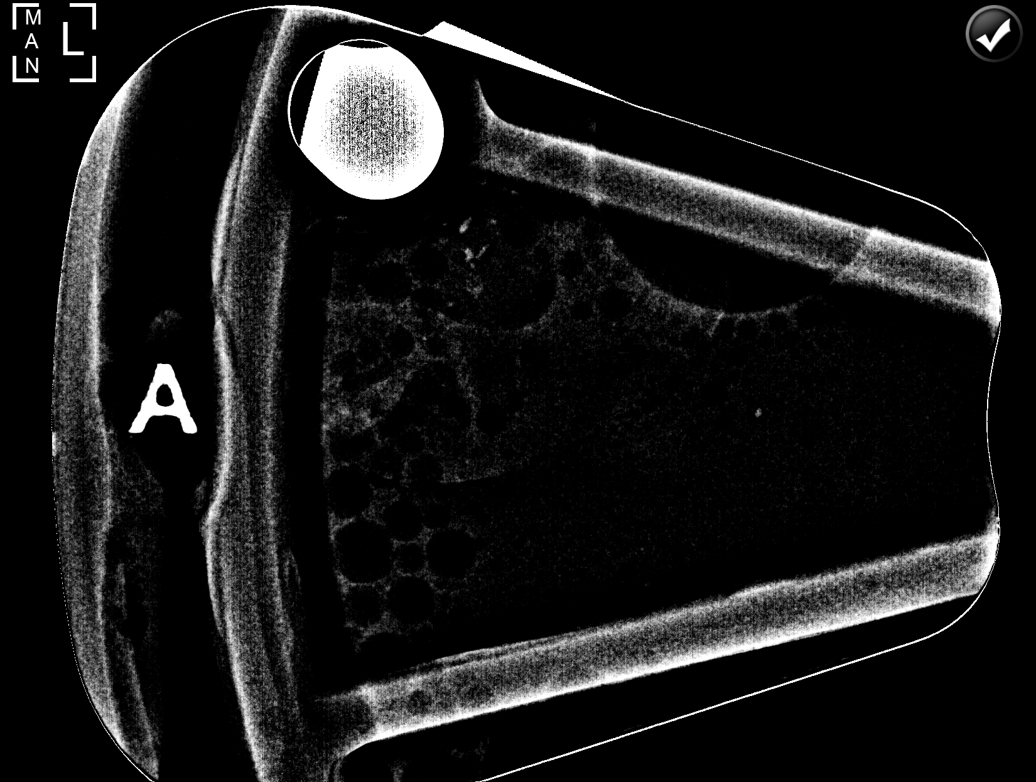

[L (5 of 5)]
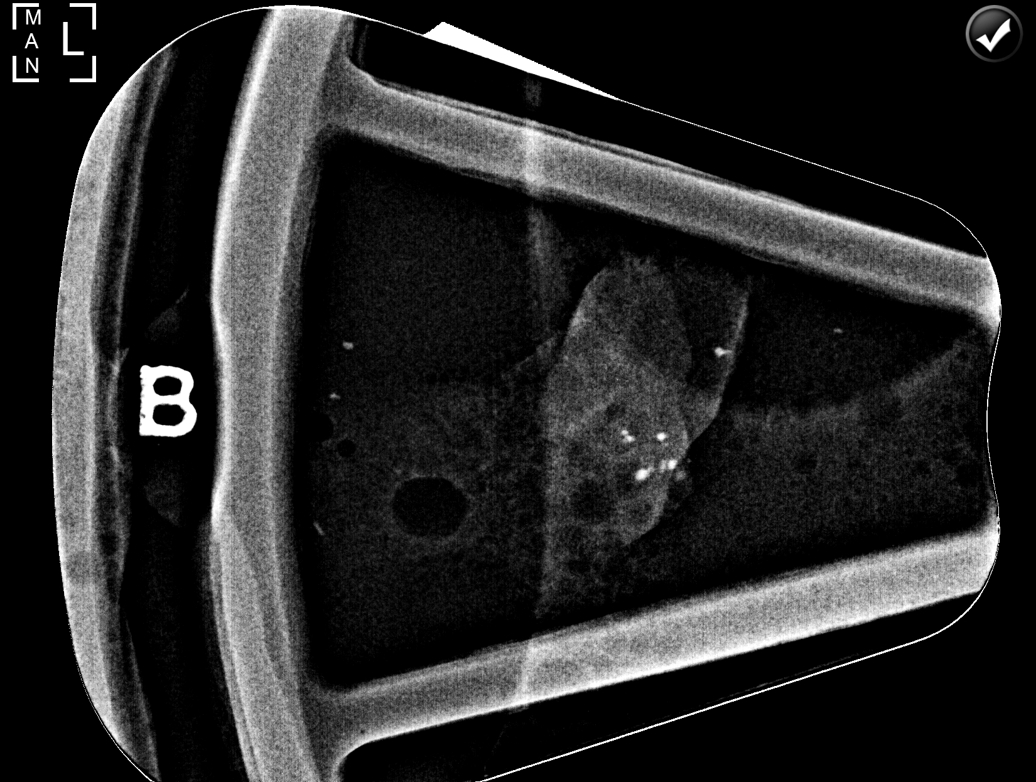

[L ML tomo · tomo slice 25/48.0]
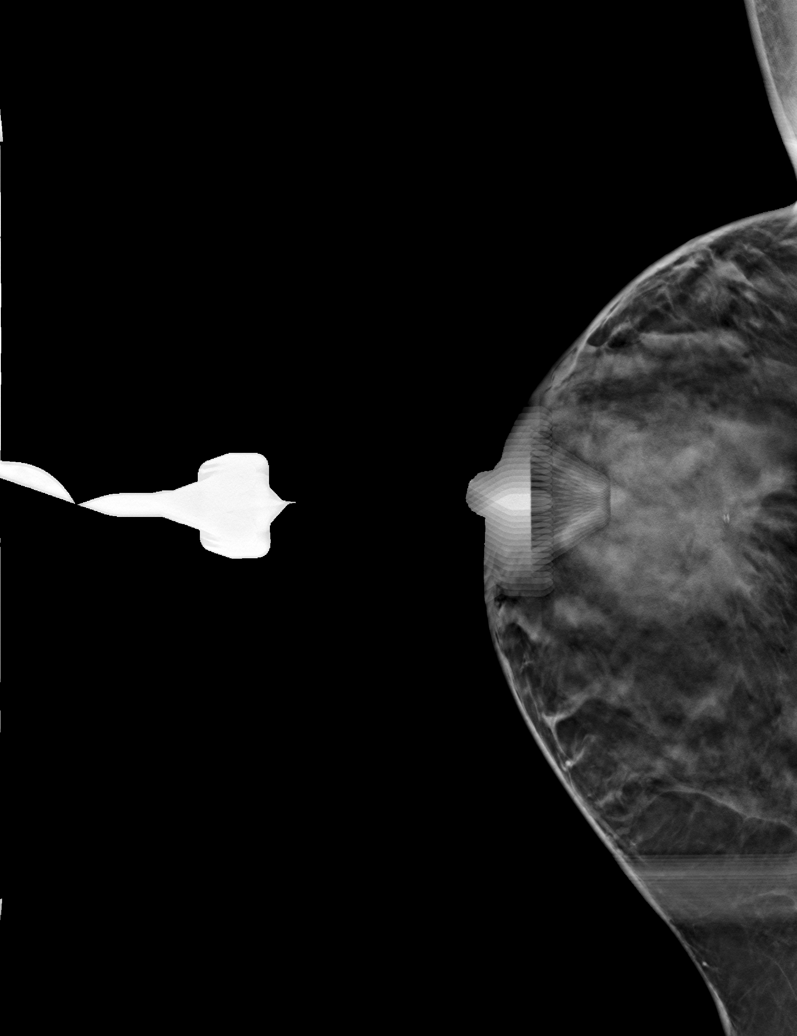

[6 of 25 positions shown; findings below may reference images not displayed]



Using sterile technique and 1% Lidocaine as local anesthetic, under
stereotactic guidance, a 9 gauge vacuum assisted device was used to
perform core needle biopsy of calcifications in the lower inner
quadrant of the left breast using a medial approach. Specimen
radiograph was performed showing presence of calcifications.
Specimens with calcifications are identified for pathology.

Lesion quadrant: Lower inner quadrant

At the conclusion of the procedure, a X shaped tissue marker clip
was deployed into the biopsy cavity. Follow-up 2-view mammogram was
performed and dictated separately.
IMPRESSION: Stereotactic-guided biopsy of left breast lower inner quadrant
calcifications. No apparent complications.

## 2019-11-02 ENCOUNTER — Ambulatory Visit: Payer: Medicaid Other | Admitting: Plastic Surgery

## 2020-01-04 ENCOUNTER — Other Ambulatory Visit: Payer: Self-pay

## 2020-01-04 ENCOUNTER — Ambulatory Visit (INDEPENDENT_AMBULATORY_CARE_PROVIDER_SITE_OTHER): Payer: Medicaid Other | Admitting: Plastic Surgery

## 2020-01-04 ENCOUNTER — Encounter: Payer: Self-pay | Admitting: Plastic Surgery

## 2020-01-04 VITALS — BP 115/80 | HR 77 | Temp 98.5°F | Wt 152.0 lb

## 2020-01-04 DIAGNOSIS — Z9889 Other specified postprocedural states: Secondary | ICD-10-CM

## 2020-01-04 NOTE — Progress Notes (Signed)
   Subjective:    Patient ID: Kelsey Klein, female    DOB: Jul 14, 1970, 48 y.o.   MRN: [id number]  Patient is a 49 year old black female here for evaluation of her breasts. She is doing really well with her rib reconstruction overall. She had left breast cancer and underwent bilateral mastectomies. She has not been radiated. She has saline implants in place. She has Mentor smooth round moderate plus profile 300 cc implants with 350 cc in place. There is no sign of infection, seroma or deflation. It looks like she has relaxation of the left lateral breast tissue. This is allowing the implant to rotate laterally and create hollowing look medially. This is probably also some loss of the fat grafting that was placed last year. This is not unusual. It has created a noticeable breast asymmetry.     Review of Systems  Constitutional: Negative for activity change and appetite change.  Eyes: Negative.   Respiratory: Negative.  Negative for chest tightness.   Cardiovascular: Negative for chest pain.  Gastrointestinal: Negative for abdominal distention.  Endocrine: Negative.   Genitourinary: Negative.   Musculoskeletal: Negative.   Hematological: Negative.   Psychiatric/Behavioral: Negative.        Objective:   Physical Exam Vitals and nursing note reviewed.  Constitutional:      Appearance: Normal appearance.  HENT:     Head: Normocephalic and atraumatic.  Cardiovascular:     Rate and Rhythm: Normal rate.     Pulses: Normal pulses.  Pulmonary:     Effort: Pulmonary effort is normal. No respiratory distress.  Skin:    General: Skin is warm.  Neurological:     General: No focal deficit present.     Mental Status: She is alert and oriented to person, place, and time.  Psychiatric:        Mood and Affect: Mood normal.        Behavior: Behavior normal.        Thought Content: Thought content normal.         Assessment & Plan:     ICD-10-CM   1. Status post bilateral breast  reconstruction  Z98.890     Recommend bilateral fat grafting to breasts.    Pictures were obtained of the patient and placed in the chart with the patient's or guardian's permission.   She understands some of the limitations and I think this would be better than trying to reposition the implant more medially as the movement seems to be natural and as expected in time.

## 2020-01-13 ENCOUNTER — Inpatient Hospital Stay: Payer: Medicaid Other | Admitting: Oncology

## 2020-01-21 NOTE — Progress Notes (Signed)
[facility]  [phone] [phone]  ID: Kelsey Klein OB: 07-Aug-1970  MR#: [id number]  YPP#:[id number]  Patient Care Team: [facility] as PCP - General (General Practice) Rico Junker, RN as Registered Nurse  CHIEF COMPLAINT: Pathologic stage Ia ER-PR positive, HER-2 negative invasive lower outer quadrant the left breast.  INTERVAL HISTORY: Patient returns to clinic today for routine yearly evaluation.  She reports mood swings and depression while on tamoxifen, and discontinued treatment several months ago.  Since discontinuing tamoxifen, she feels well and is asymptomatic. She has no neurologic complaints.  She denies any recent fevers or illnesses.  She has a good appetite and denies weight loss.  She denies any chest pain, shortness of breath, cough, or hemoptysis.  She denies any nausea, vomiting, constipation, or diarrhea.  She has no urinary complaints.  Patient offers no specific complaints today.  REVIEW OF SYSTEMS:   Review of Systems  Constitutional: Negative.  Negative for fever, malaise/fatigue and weight loss.  Respiratory: Negative.  Negative for cough and shortness of breath.   Cardiovascular: Negative.  Negative for chest pain and leg swelling.  Gastrointestinal: Negative.  Negative for abdominal pain and constipation.  Genitourinary: Negative.  Negative for dysuria.  Musculoskeletal: Negative.  Negative for back pain.  Skin: Negative.  Negative for rash.  Neurological: Negative.  Negative for focal weakness, weakness and headaches.  Psychiatric/Behavioral: Negative.  The patient is not nervous/anxious.     As per HPI. Otherwise, a complete review of systems is negative.  PAST MEDICAL HISTORY: Past Medical History:  Diagnosis Date   Breast cancer, stage 1, estrogen receptor negative, right ([place]) 08/19/2015    18 mm; pT1c  pN0 (0/6nodes positive). ER/PR +; Her 2 neu not overexpressed   Family  history of breast cancer     PAST SURGICAL HISTORY: Past Surgical History:  Procedure Laterality Date   BREAST BIOPSY Left 05/27/2017   U/S core Bx- invasive mammary ER/PR positive Her2 negative   BREAST BIOPSY Left 05/27/2017   Affirm Bx- DCIS   BREAST RECONSTRUCTION WITH PLACEMENT OF TISSUE EXPANDER AND FLEX HD (ACELLULAR HYDRATED DERMIS) Bilateral 08/18/2017   Procedure: BREAST RECONSTRUCTION WITH PLACEMENT OF TISSUE EXPANDER AND FLEX HD (ACELLULAR HYDRATED DERMIS);  Surgeon: Wallace Going, DO;  Location: [facility];  Service: Plastics;  Laterality: Bilateral;   LIPOSUCTION WITH LIPOFILLING Bilateral 05/21/2018   Procedure: liposuction with fat grafting and revolve;  Surgeon: Wallace Going, DO;  Location: [facility];  Service: Plastics;  Laterality: Bilateral;   MASTECTOMY W/ SENTINEL NODE BIOPSY Bilateral 08/18/2017   Procedure: MASTECTOMY WITH SENTINEL LYMPH NODE BIOPSY,FROZEN SECTION;  Surgeon: Robert Bellow, MD;  Location: [facility];  Service: General;  Laterality: Bilateral;   REMOVAL OF TISSUE EXPANDER AND PLACEMENT OF IMPLANT Bilateral 12/10/2017   Procedure: REMOVAL OF TISSUE EXPANDERS AND PLACEMENT OF IMPLANTS;  Surgeon: Wallace Going, DO;  Location: [place];  Service: Plastics;  Laterality: Bilateral;    FAMILY HISTORY: Family History  Problem Relation Age of Onset   Arthritis Mother    Cirrhosis Father        deceased 66; adopted   Hypertension Father    Hypertension Sister    Alzheimer's disease Maternal Grandmother    Breast cancer Paternal Aunt        age at dx unknown   Breast cancer Paternal Aunt        age at dx unknown    ADVANCED  DIRECTIVES (Y/N):  N  HEALTH MAINTENANCE: Social History   Tobacco Use   Smoking status: Never Smoker   Smokeless tobacco: Never Used  Vaping Use   Vaping Use: Never used  Substance Use Topics   Alcohol use: Not Currently   Drug use: No      Colonoscopy:  PAP:  Bone density:  Lipid panel:  No Known Allergies  Current Outpatient Medications  Medication Sig Dispense Refill   tamoxifen (NOLVADEX) 20 MG tablet Take 1 tablet (20 mg total) by mouth daily. (Patient not taking: Reported on 01/04/2020) 90 tablet 3   No current facility-administered medications for this visit.    OBJECTIVE: Vitals:   01/24/20 1037  BP: 128/86  Pulse: 67  Resp: 18  Temp: 97.6 F (36.4 C)  SpO2: 100%     Body mass index is 27.03 kg/m.    ECOG FS:0 - Asymptomatic  General: Well-developed, well-nourished, no acute distress. Eyes: Pink conjunctiva, anicteric sclera. HEENT: Normocephalic, moist mucous membranes. Breast: Bilateral mastectomy with reconstruction. Lungs: No audible wheezing or coughing. Heart: Regular rate and rhythm. Abdomen: Soft, nontender, no obvious distention. Musculoskeletal: No edema, cyanosis, or clubbing. Neuro: Alert, answering all questions appropriately. Cranial nerves grossly intact. Skin: No rashes or petechiae noted. Psych: Normal affect.  LAB RESULTS:  No results found for: NA, K, CL, CO2, GLUCOSE, BUN, CREATININE, CALCIUM, PROT, ALBUMIN, AST, ALT, ALKPHOS, BILITOT, GFRNONAA, GFRAA  No results found for: WBC, NEUTROABS, HGB, HCT, MCV, PLT   STUDIES: No results found.  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  Oncotype DX score 13, low risk of recurrence.  PLAN:    1. 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  She reports she may have to undergo repeat surgery in the near future.  Because she had bilateral mastectomy, she did not require adjuvant XRT.  She had a low risk Oncotype score therefore chemotherapy was unnecessary.  Patient has elected to discontinue tamoxifen secondary to increasing depression and  mood changes.  She does not wish to try an aromatase inhibitor or other medications.  She expressed understanding that this may increase her risk of recurrence.  She also continues to request less frequent follow-up, therefore return to clinic in 1 year with video assisted telemedicine visit.    I spent a total of 20 minutes reviewing chart data, face-to-face evaluation with the patient, counseling and coordination of care as detailed above.    Patient expressed understanding and was in agreement with this plan. She also understands that She can call clinic at any time with any questions, concerns, or complaints.   Cancer Staging Primary cancer of lower-outer quadrant of left breast [facility]) Staging form: Breast, AJCC 8th Edition - Clinical stage from 05/31/2017: Stage IB (cT2, cN0, cM0, G1, ER: Positive, PR: Positive, HER2: Negative) - Signed by Lloyd Huger, MD on 05/31/2017 - Pathologic stage from 09/26/2017: Stage IA (pT1c, pN0, cM0, G2, ER+, PR+, HER2-, Oncotype DX score: 13) - Signed by Lloyd Huger, MD on 09/26/2017   Lloyd Huger, MD   01/25/2020 6:19 AM

## 2020-01-24 ENCOUNTER — Encounter: Payer: Self-pay | Admitting: Oncology

## 2020-01-24 ENCOUNTER — Inpatient Hospital Stay: Payer: Medicaid Other | Attending: Oncology | Admitting: Oncology

## 2020-01-24 ENCOUNTER — Other Ambulatory Visit: Payer: Self-pay

## 2020-01-24 VITALS — BP 128/86 | HR 67 | Temp 97.6°F | Resp 18 | Wt 152.6 lb

## 2020-01-24 DIAGNOSIS — Z79810 Long term (current) use of selective estrogen receptor modulators (SERMs): Secondary | ICD-10-CM | POA: Insufficient documentation

## 2020-01-24 DIAGNOSIS — C50512 Malignant neoplasm of lower-outer quadrant of left female breast: Secondary | ICD-10-CM

## 2020-01-24 DIAGNOSIS — Z170 Estrogen receptor positive status [ER+]: Secondary | ICD-10-CM | POA: Diagnosis not present

## 2020-02-07 NOTE — H&P (View-Only) (Signed)
ICD-10-CM   1. Breast asymmetry following reconstructive surgery  N65.1   2. S/P breast reconstruction, bilateral  Z98.890       Patient ID: Kelsey Klein, female    DOB: [birth year], 49 y.o.   MRN: [id number]   History of Present Illness: Kelsey Klein is a 49 y.o.  female  with a history of postoperative breast asymmetry.  She presents for preoperative evaluation for upcoming procedure, fat filling of bilateral breasts for asymmetry, scheduled for 02/23/2020 with Dr. Marla Roe.  Summary from previous visit: Patient had left breast cancer and underwent bilateral mastectomies.  She has not been radiated.  She has Mentor smooth round moderate plus profile 300 cc implants with 350 cc of saline in place.  She has some relaxation of the left lateral breast tissue which is allowing the implant to rotate laterally and create a hollow look medially in addition to some suspected loss of the fat grafting that was placed last year.  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   Past Medical History: Allergies: No Known Allergies  Current Medications:  Current Outpatient Medications:  .  tamoxifen (NOLVADEX) 20 MG tablet, Take 1 tablet (20 mg total) by mouth daily., Disp: 90 tablet, Rfl: 3  Past Medical Problems: Past Medical History:  Diagnosis Date  . Breast cancer, stage 1, estrogen receptor negative, right ([place]) 08/19/2015    18 mm; pT1c  pN0 (0/6nodes positive). ER/PR +; Her 2 neu not overexpressed  . Family history of breast cancer     Past Surgical History: Past Surgical History:  Procedure Laterality Date  . 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 BREAST BIOPSY Left 05/27/2017   Affirm Bx- DCIS  . BREAST RECONSTRUCTION WITH PLACEMENT OF TISSUE EXPANDER AND FLEX HD (ACELLULAR HYDRATED DERMIS) Bilateral 08/18/2017   Procedure: BREAST RECONSTRUCTION WITH  PLACEMENT OF TISSUE EXPANDER AND FLEX HD (ACELLULAR HYDRATED DERMIS);  Surgeon: Wallace Going, DO;  Location: [facility];  Service: Plastics;  Laterality: Bilateral;  . LIPOSUCTION WITH LIPOFILLING Bilateral 05/21/2018   Procedure: liposuction with fat grafting and revolve;  Surgeon: Wallace Going, DO;  Location: [place];  Service: Plastics;  Laterality: Bilateral;  . MASTECTOMY W/ SENTINEL NODE BIOPSY Bilateral 08/18/2017   Procedure: MASTECTOMY WITH SENTINEL LYMPH NODE BIOPSY,FROZEN SECTION;  Surgeon: Robert Bellow, MD;  Location: [facility];  Service: General;  Laterality: Bilateral;  . REMOVAL OF TISSUE EXPANDER AND PLACEMENT OF IMPLANT Bilateral 12/10/2017   Procedure: REMOVAL OF TISSUE EXPANDERS AND PLACEMENT OF IMPLANTS;  Surgeon: Wallace Going, DO;  Location: [place];  Service: Plastics;  Laterality: Bilateral;    Social History: Social History   Socioeconomic History  . Marital status: Single    Spouse name: Not on file  . Number of children: Not on file  . Years of education: Not on file  . Highest education level: Not on file  Occupational History  . Not on file  Tobacco Use  . Smoking status: Never Smoker  . Smokeless tobacco: Never Used  Vaping Use  . Vaping Use: Never used  Substance and Sexual Activity  . Alcohol use: Not Currently  . Drug use: No  . Sexual activity: Yes    Birth control/protection: Condom  Other Topics Concern  . Not on file  Social History Narrative  . Not on file  Social Determinants of Health   Financial Resource Strain:   . Difficulty of Paying Living Expenses: Not on file  Food Insecurity:   . Worried About Charity fundraiser in the Last Year: Not on file  . Ran Out of Food in the Last Year: Not on file  Transportation Needs:   . Lack of Transportation (Medical): Not on file  . Lack of Transportation (Non-Medical): Not on file  Physical Activity:   . Days of Exercise per Week:  Not on file  . Minutes of Exercise per Session: Not on file  Stress:   . Feeling of Stress : Not on file  Social Connections:   . Frequency of Communication with Friends and Family: Not on file  . Frequency of Social Gatherings with Friends and Family: Not on file  . Attends Religious Services: Not on file  . Active Member of Clubs or Organizations: Not on file  . Attends Archivist Meetings: Not on file  . Marital Status: Not on file  Intimate Partner Violence:   . Fear of Current or Ex-Partner: Not on file  . Emotionally Abused: Not on file  . Physically Abused: Not on file  . Sexually Abused: Not on file    Family History: Family History  Problem Relation Age of Onset  . Arthritis Mother   . Cirrhosis Father        deceased 60; adopted  . Hypertension Father   . Hypertension Sister   . Alzheimer's disease Maternal Grandmother   . Breast cancer Paternal Aunt        age at dx unknown  . Breast cancer Paternal Aunt        age at dx unknown    Review of Systems: Review of Systems  Constitutional: Negative for chills and fever.  HENT: Negative for congestion and sore throat.   Respiratory: Negative for cough and shortness of breath.   Cardiovascular: Negative for chest pain and palpitations.  Gastrointestinal: Negative for abdominal pain, nausea and vomiting.  Musculoskeletal: Negative for back pain, joint pain, myalgias and neck pain.  Skin: Negative for itching and rash.    Physical Exam: Vital Signs BP 110/74   Pulse 67   Temp 98.4 F (36.9 C) (Oral)   Ht 5' 2"  (1.575 m)   Wt 155 lb (70.3 kg)   SpO2 97%   BMI 28.35 kg/m  Physical Exam Constitutional:      General: She is not in acute distress.    Appearance: Normal appearance. She is normal weight. She is not ill-appearing.  HENT:     Head: Normocephalic and atraumatic.  Eyes:     Extraocular Movements: Extraocular movements intact.  Cardiovascular:     Rate and Rhythm: Normal rate and  regular rhythm.     Pulses: Normal pulses.     Heart sounds: Normal heart sounds.  Pulmonary:     Effort: Pulmonary effort is normal.     Breath sounds: Normal breath sounds. No wheezing, rhonchi or rales.  Chest:     Comments: Bilateral mastectomy scars healing well, c/d/i. Left implant sits more lateral with lack of fullness medially. Abdominal:     General: Bowel sounds are normal.     Palpations: Abdomen is soft.  Musculoskeletal:        General: No swelling. Normal range of motion.     Cervical back: Normal range of motion.  Skin:    General: Skin is warm and dry.     Coloration:  Skin is not pale.     Findings: No erythema or rash.  Neurological:     General: No focal deficit present.     Mental Status: She is alert and oriented to person, place, and time.  Psychiatric:        Mood and Affect: Mood normal.        Behavior: Behavior normal.        Thought Content: Thought content normal.        Judgment: Judgment normal.     Assessment/Plan:  Ms. Zwilling scheduled for fat grafting to bilateral breasts with Dr. Marla Roe.  Risks, benefits, and alternatives of procedure discussed, questions answered and consent obtained.    Patient had "Body Sculpt" yesterday. She purchased 3 sessions.  Recommend she wait to have the last 2 sessions until after surgery as it can make good fat retrieval more difficult.  Smoking Status: non-smoker; Counseling Given? N/A Last Mammogram: N/A - bilateral mastectomies; Results: N/A  Caprini Score: 6 High; Risk Factors include: 49 year old female, Hx of cancer, BMI > 25, and length of planned surgery. Recommendation for mechanical and pharmacological prophylaxis during surgery. Encourage early ambulation.   Pictures obtained: 01/04/20  Post-op Rx sent to pharmacy: Norco, Zofran, Keflex, Ibuprofen 600 mg  Patient was provided with the General Surgical Risk consent document and Pain Medication Agreement prior to their appointment.  They had  adequate time to read through the risk consent documents and Pain Medication Agreement. We also discussed them in person together during this preop appointment. All of their questions were answered to their satisfaction.  Recommended calling if they have any further questions.  Risk consent form and Pain Medication Agreement to be scanned into patient's chart.  The risks that can be encountered with and after liposuction were discussed and include the following but no limited to these:  Asymmetry, fluid accumulation, firmness of the area, fat necrosis with death of fat tissue, bleeding, infection, delayed healing, anesthesia risks, skin sensation changes, injury to structures including nerves, blood vessels, and muscles which may be temporary or permanent, allergies to tape, suture materials and glues, blood products, topical preparations or injected agents, skin and contour irregularities, skin discoloration and swelling, deep vein thrombosis, cardiac and pulmonary complications, pain, which may persist, persistent pain, recurrence of the lesion, poor healing of the incision, possible need for revisional surgery or staged procedures. Thiere can also be persistent swelling, poor wound healing, rippling or loose skin, worsening of cellulite, swelling, and thermal burn or heat injury from ultrasound with the ultrasound-assisted lipoplasty technique. Any change in weight fluctuations can alter the outcome.  The [place] was signed into law in 2016 which includes the topic of electronic health records.  This provides immediate access to information in MyChart.  This includes consultation notes, operative notes, office notes, lab results and pathology reports.  If you have any questions about what you read please let us know at your next visit or call us at the office.  We are right here with you.   Electronically signed by: Threasa Heads, PA-C 02/08/2020 1:50 PM

## 2020-02-07 NOTE — Progress Notes (Signed)
ICD-10-CM   1. Breast asymmetry following reconstructive surgery  N65.1   2. S/P breast reconstruction, bilateral  Z98.890       Patient ID: Kelsey Klein, female    DOB: 22-Feb-1971, 49 y.o.   MRN: [id number]   History of Present Illness: Kelsey Klein is a 49 y.o.  female  with a history of postoperative breast asymmetry.  She presents for preoperative evaluation for upcoming procedure, fat filling of bilateral breasts for asymmetry, scheduled for 02/23/2020 with Dr. Marla Roe.  Summary from previous visit: Patient had left breast cancer and underwent bilateral mastectomies.  She has not been radiated.  She has Mentor smooth round moderate plus profile 300 cc implants with 350 cc of saline in place.  She has some relaxation of the left lateral breast tissue which is allowing the implant to rotate laterally and create a hollow look medially in addition to some suspected loss of the fat grafting that was placed last year.  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   Past Medical History: Allergies: No Known Allergies  Current Medications:  Current Outpatient Medications:  .  tamoxifen (NOLVADEX) 20 MG tablet, Take 1 tablet (20 mg total) by mouth daily., Disp: 90 tablet, Rfl: 3  Past Medical Problems: Past Medical History:  Diagnosis Date  . Breast cancer, stage 1, estrogen receptor negative, right ([place]) 08/19/2015    18 mm; pT1c  pN0 (0/6nodes positive). ER/PR +; Her 2 neu not overexpressed  . Family history of breast cancer     Past Surgical History: Past Surgical History:  Procedure Laterality Date  . 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 BREAST BIOPSY Left 05/27/2017   Affirm Bx- DCIS  . BREAST RECONSTRUCTION WITH PLACEMENT OF TISSUE EXPANDER AND FLEX HD (ACELLULAR HYDRATED DERMIS) Bilateral 08/18/2017   Procedure: BREAST RECONSTRUCTION WITH  PLACEMENT OF TISSUE EXPANDER AND FLEX HD (ACELLULAR HYDRATED DERMIS);  Surgeon: Wallace Going, DO;  Location: [facility];  Service: Plastics;  Laterality: Bilateral;  . LIPOSUCTION WITH LIPOFILLING Bilateral 05/21/2018   Procedure: liposuction with fat grafting and revolve;  Surgeon: Wallace Going, DO;  Location: [place];  Service: Plastics;  Laterality: Bilateral;  . MASTECTOMY W/ SENTINEL NODE BIOPSY Bilateral 08/18/2017   Procedure: MASTECTOMY WITH SENTINEL LYMPH NODE BIOPSY,FROZEN SECTION;  Surgeon: Robert Bellow, MD;  Location: [facility];  Service: General;  Laterality: Bilateral;  . REMOVAL OF TISSUE EXPANDER AND PLACEMENT OF IMPLANT Bilateral 12/10/2017   Procedure: REMOVAL OF TISSUE EXPANDERS AND PLACEMENT OF IMPLANTS;  Surgeon: Wallace Going, DO;  Location: [place];  Service: Plastics;  Laterality: Bilateral;    Social History: Social History   Socioeconomic History  . Marital status: Single    Spouse name: Not on file  . Number of children: Not on file  . Years of education: Not on file  . Highest education level: Not on file  Occupational History  . Not on file  Tobacco Use  . Smoking status: Never Smoker  . Smokeless tobacco: Never Used  Vaping Use  . Vaping Use: Never used  Substance and Sexual Activity  . Alcohol use: Not Currently  . Drug use: No  . Sexual activity: Yes    Birth control/protection: Condom  Other Topics Concern  . Not on file  Social History Narrative  . Not on file  Social Determinants of Health   Financial Resource Strain:   . Difficulty of Paying Living Expenses: Not on file  Food Insecurity:   . Worried About Charity fundraiser in the Last Year: Not on file  . Ran Out of Food in the Last Year: Not on file  Transportation Needs:   . Lack of Transportation (Medical): Not on file  . Lack of Transportation (Non-Medical): Not on file  Physical Activity:   . Days of Exercise per Week:  Not on file  . Minutes of Exercise per Session: Not on file  Stress:   . Feeling of Stress : Not on file  Social Connections:   . Frequency of Communication with Friends and Family: Not on file  . Frequency of Social Gatherings with Friends and Family: Not on file  . Attends Religious Services: Not on file  . Active Member of Clubs or Organizations: Not on file  . Attends Archivist Meetings: Not on file  . Marital Status: Not on file  Intimate Partner Violence:   . Fear of Current or Ex-Partner: Not on file  . Emotionally Abused: Not on file  . Physically Abused: Not on file  . Sexually Abused: Not on file    Family History: Family History  Problem Relation Age of Onset  . Arthritis Mother   . Cirrhosis Father        deceased 10; adopted  . Hypertension Father   . Hypertension Sister   . Alzheimer's disease Maternal Grandmother   . Breast cancer Paternal Aunt        age at dx unknown  . Breast cancer Paternal Aunt        age at dx unknown    Review of Systems: Review of Systems  Constitutional: Negative for chills and fever.  HENT: Negative for congestion and sore throat.   Respiratory: Negative for cough and shortness of breath.   Cardiovascular: Negative for chest pain and palpitations.  Gastrointestinal: Negative for abdominal pain, nausea and vomiting.  Musculoskeletal: Negative for back pain, joint pain, myalgias and neck pain.  Skin: Negative for itching and rash.    Physical Exam: Vital Signs BP 110/74   Pulse 67   Temp 98.4 F (36.9 C) (Oral)   Ht 5' 2"  (1.575 m)   Wt 155 lb (70.3 kg)   SpO2 97%   BMI 28.35 kg/m  Physical Exam Constitutional:      General: She is not in acute distress.    Appearance: Normal appearance. She is normal weight. She is not ill-appearing.  HENT:     Head: Normocephalic and atraumatic.  Eyes:     Extraocular Movements: Extraocular movements intact.  Cardiovascular:     Rate and Rhythm: Normal rate and  regular rhythm.     Pulses: Normal pulses.     Heart sounds: Normal heart sounds.  Pulmonary:     Effort: Pulmonary effort is normal.     Breath sounds: Normal breath sounds. No wheezing, rhonchi or rales.  Chest:     Comments: Bilateral mastectomy scars healing well, c/d/i. Left implant sits more lateral with lack of fullness medially. Abdominal:     General: Bowel sounds are normal.     Palpations: Abdomen is soft.  Musculoskeletal:        General: No swelling. Normal range of motion.     Cervical back: Normal range of motion.  Skin:    General: Skin is warm and dry.     Coloration:  Skin is not pale.     Findings: No erythema or rash.  Neurological:     General: No focal deficit present.     Mental Status: She is alert and oriented to person, place, and time.  Psychiatric:        Mood and Affect: Mood normal.        Behavior: Behavior normal.        Thought Content: Thought content normal.        Judgment: Judgment normal.     Assessment/Plan:  Ms. Schweers scheduled for fat grafting to bilateral breasts with Dr. Marla Roe.  Risks, benefits, and alternatives of procedure discussed, questions answered and consent obtained.    Patient had "Body Sculpt" yesterday. She purchased 3 sessions.  Recommend she wait to have the last 2 sessions until after surgery as it can make good fat retrieval more difficult.  Smoking Status: non-smoker; Counseling Given? N/A Last Mammogram: N/A - bilateral mastectomies; Results: N/A  Caprini Score: 6 High; Risk Factors include: 49 year old female, Hx of cancer, BMI > 25, and length of planned surgery. Recommendation for mechanical and pharmacological prophylaxis during surgery. Encourage early ambulation.   Pictures obtained: 01/04/20  Post-op Rx sent to pharmacy: Norco, Zofran, Keflex, Ibuprofen 600 mg  Patient was provided with the General Surgical Risk consent document and Pain Medication Agreement prior to their appointment.  They had  adequate time to read through the risk consent documents and Pain Medication Agreement. We also discussed them in person together during this preop appointment. All of their questions were answered to their satisfaction.  Recommended calling if they have any further questions.  Risk consent form and Pain Medication Agreement to be scanned into patient's chart.  The risks that can be encountered with and after liposuction were discussed and include the following but no limited to these:  Asymmetry, fluid accumulation, firmness of the area, fat necrosis with death of fat tissue, bleeding, infection, delayed healing, anesthesia risks, skin sensation changes, injury to structures including nerves, blood vessels, and muscles which may be temporary or permanent, allergies to tape, suture materials and glues, blood products, topical preparations or injected agents, skin and contour irregularities, skin discoloration and swelling, deep vein thrombosis, cardiac and pulmonary complications, pain, which may persist, persistent pain, recurrence of the lesion, poor healing of the incision, possible need for revisional surgery or staged procedures. Thiere can also be persistent swelling, poor wound healing, rippling or loose skin, worsening of cellulite, swelling, and thermal burn or heat injury from ultrasound with the ultrasound-assisted lipoplasty technique. Any change in weight fluctuations can alter the outcome.  The [place] was signed into law in 2016 which includes the topic of electronic health records.  This provides immediate access to information in MyChart.  This includes consultation notes, operative notes, office notes, lab results and pathology reports.  If you have any questions about what you read please let us know at your next visit or call us at the office.  We are right here with you.   Electronically signed by: Threasa Heads, PA-C 02/08/2020 1:50 PM

## 2020-02-08 ENCOUNTER — Other Ambulatory Visit: Payer: Self-pay

## 2020-02-08 ENCOUNTER — Ambulatory Visit (INDEPENDENT_AMBULATORY_CARE_PROVIDER_SITE_OTHER): Payer: Medicaid Other | Admitting: Plastic Surgery

## 2020-02-08 ENCOUNTER — Encounter: Payer: Self-pay | Admitting: Plastic Surgery

## 2020-02-08 VITALS — BP 110/74 | HR 67 | Temp 98.4°F | Ht 62.0 in | Wt 155.0 lb

## 2020-02-08 DIAGNOSIS — Z9889 Other specified postprocedural states: Secondary | ICD-10-CM

## 2020-02-08 DIAGNOSIS — N651 Disproportion of reconstructed breast: Secondary | ICD-10-CM

## 2020-02-08 MED ORDER — ONDANSETRON HCL 4 MG PO TABS: 4.0000 mg | ORAL_TABLET | Freq: Three times a day (TID) | ORAL | 0 refills | Status: DC | PRN

## 2020-02-08 MED ORDER — CEPHALEXIN 500 MG PO CAPS: 500.0000 mg | ORAL_CAPSULE | Freq: Four times a day (QID) | ORAL | 0 refills | Status: AC

## 2020-02-08 MED ORDER — IBUPROFEN 600 MG PO TABS: 600.0000 mg | ORAL_TABLET | Freq: Four times a day (QID) | ORAL | 0 refills | Status: DC | PRN

## 2020-02-08 MED ORDER — HYDROCODONE-ACETAMINOPHEN 5-325 MG PO TABS: 1.0000 | ORAL_TABLET | Freq: Three times a day (TID) | ORAL | 0 refills | Status: AC | PRN

## 2020-02-14 ENCOUNTER — Encounter (HOSPITAL_BASED_OUTPATIENT_CLINIC_OR_DEPARTMENT_OTHER): Payer: Self-pay | Admitting: Plastic Surgery

## 2020-02-14 ENCOUNTER — Other Ambulatory Visit: Payer: Self-pay

## 2020-02-19 ENCOUNTER — Other Ambulatory Visit (HOSPITAL_COMMUNITY)
Admission: RE | Admit: 2020-02-19 | Discharge: 2020-02-19 | Disposition: A | Discharge status: Home / Self Care | Payer: Medicaid Other | Source: Ambulatory Visit | Attending: Plastic Surgery | Admitting: Plastic Surgery

## 2020-02-19 DIAGNOSIS — Z01812 Encounter for preprocedural laboratory examination: Secondary | ICD-10-CM | POA: Diagnosis present

## 2020-02-19 DIAGNOSIS — Z20822 Contact with and (suspected) exposure to covid-19: Secondary | ICD-10-CM | POA: Insufficient documentation

## 2020-02-19 LAB — SARS CORONAVIRUS 2 (TAT 6-24 HRS): SARS Coronavirus 2: NEGATIVE

## 2020-02-22 NOTE — Anesthesia Preprocedure Evaluation (Addendum)
Anesthesia Evaluation  Patient identified by MRN, date of birth, ID band Patient awake    Reviewed: Allergy & Precautions, NPO status , Patient's Chart, lab work & pertinent test results  History of Anesthesia Complications Negative for: history of anesthetic complications  Airway Mallampati: II  TM Distance: >3 FB Neck ROM: Full    Dental no notable dental hx. (+) Teeth Intact, Dental Advisory Given   Pulmonary neg pulmonary ROS,    Pulmonary exam normal breath sounds clear to auscultation       Cardiovascular negative cardio ROS Normal cardiovascular exam Rhythm:Regular Rate:Normal     Neuro/Psych negative neurological ROS  negative psych ROS   GI/Hepatic negative GI ROS, Neg liver ROS,   Endo/Other  negative endocrine ROS  Renal/GU negative Renal ROS  negative genitourinary   Musculoskeletal negative musculoskeletal ROS (+)   Abdominal   Peds negative pediatric ROS (+)  Hematology negative hematology ROS (+)   Anesthesia Other Findings   Reproductive/Obstetrics negative OB ROS  Breast cancer                             Anesthesia Physical  Anesthesia Plan  ASA: II  Anesthesia Plan: General   Post-op Pain Management:    Induction: Intravenous  PONV Risk Score and Plan: 4 or greater and Treatment may vary due to age or medical condition, Ondansetron, Dexamethasone and Midazolam  Airway Management Planned: LMA and Oral ETT  Additional Equipment: None  Intra-op Plan:   Post-operative Plan: Extubation in OR  Informed Consent: I have reviewed the patients History and Physical, chart, labs and discussed the procedure including the risks, benefits and alternatives for the proposed anesthesia with the patient or authorized representative who has indicated his/her understanding and acceptance.     Dental advisory given  Plan Discussed with: CRNA and  Anesthesiologist  Anesthesia Plan Comments:        Anesthesia Quick Evaluation

## 2020-02-23 ENCOUNTER — Encounter (HOSPITAL_BASED_OUTPATIENT_CLINIC_OR_DEPARTMENT_OTHER)
Admission: RE | Disposition: A | Discharge status: Home / Self Care | Payer: Self-pay | Source: Home / Self Care | Attending: Plastic Surgery

## 2020-02-23 ENCOUNTER — Ambulatory Visit (HOSPITAL_BASED_OUTPATIENT_CLINIC_OR_DEPARTMENT_OTHER): Payer: Medicaid Other | Admitting: Anesthesiology

## 2020-02-23 ENCOUNTER — Ambulatory Visit (HOSPITAL_BASED_OUTPATIENT_CLINIC_OR_DEPARTMENT_OTHER)
Admission: RE | Admit: 2020-02-23 | Discharge: 2020-02-23 | Disposition: A | Discharge status: Home / Self Care | Payer: Medicaid Other | Attending: Plastic Surgery | Admitting: Plastic Surgery

## 2020-02-23 ENCOUNTER — Encounter (HOSPITAL_BASED_OUTPATIENT_CLINIC_OR_DEPARTMENT_OTHER): Payer: Self-pay | Admitting: Plastic Surgery

## 2020-02-23 ENCOUNTER — Other Ambulatory Visit: Payer: Self-pay

## 2020-02-23 DIAGNOSIS — Z853 Personal history of malignant neoplasm of breast: Secondary | ICD-10-CM | POA: Insufficient documentation

## 2020-02-23 DIAGNOSIS — N651 Disproportion of reconstructed breast: Secondary | ICD-10-CM | POA: Diagnosis not present

## 2020-02-23 DIAGNOSIS — Z9889 Other specified postprocedural states: Secondary | ICD-10-CM | POA: Diagnosis not present

## 2020-02-23 HISTORY — PX: LIPOSUCTION WITH LIPOFILLING: SHX6436

## 2020-02-23 LAB — POCT PREGNANCY, URINE: Preg Test, Ur: NEGATIVE

## 2020-02-23 SURGERY — LIPOSUCTION, WITH FAT TRANSFER
Anesthesia: General | Site: Breast | Laterality: Bilateral

## 2020-02-23 MED ORDER — SODIUM CHLORIDE 0.9% FLUSH: 3.0000 mL | Freq: Two times a day (BID) | INTRAVENOUS | Status: DC

## 2020-02-23 MED ORDER — DEXAMETHASONE SODIUM PHOSPHATE 10 MG/ML IJ SOLN
INTRAMUSCULAR | Status: DC | PRN
  Administered 2020-02-23: 5 mg via INTRAVENOUS

## 2020-02-23 MED ORDER — CHLORHEXIDINE GLUCONATE CLOTH 2 % EX PADS: 6.0000 | MEDICATED_PAD | Freq: Once | CUTANEOUS | Status: DC

## 2020-02-23 MED ORDER — ACETAMINOPHEN 325 MG PO TABS: 325.0000 mg | ORAL_TABLET | ORAL | Status: DC | PRN

## 2020-02-23 MED ORDER — LACTATED RINGERS IV SOLN: INTRAVENOUS | Status: DC | PRN

## 2020-02-23 MED ORDER — MEPERIDINE HCL 25 MG/ML IJ SOLN: 6.2500 mg | INTRAMUSCULAR | Status: DC | PRN

## 2020-02-23 MED ORDER — ACETAMINOPHEN 160 MG/5ML PO SOLN: 325.0000 mg | ORAL | Status: DC | PRN

## 2020-02-23 MED ORDER — PROPOFOL 10 MG/ML IV BOLUS
INTRAVENOUS | Status: DC | PRN
  Administered 2020-02-23: 40 mg via INTRAVENOUS
  Administered 2020-02-23: 160 mg via INTRAVENOUS

## 2020-02-23 MED ORDER — ONDANSETRON HCL 4 MG/2ML IJ SOLN
INTRAMUSCULAR | Status: DC | PRN
  Administered 2020-02-23: 4 mg via INTRAVENOUS

## 2020-02-23 MED ORDER — LIDOCAINE HCL 1 % IJ SOLN
INTRAVENOUS | Status: DC | PRN
  Administered 2020-02-23: 700 mL

## 2020-02-23 MED ORDER — SODIUM CHLORIDE 0.9% FLUSH: 3.0000 mL | INTRAVENOUS | Status: DC | PRN

## 2020-02-23 MED ORDER — MIDAZOLAM HCL 5 MG/5ML IJ SOLN
INTRAMUSCULAR | Status: DC | PRN
  Administered 2020-02-23: 2 mg via INTRAVENOUS

## 2020-02-23 MED ORDER — LIDOCAINE-EPINEPHRINE 1 %-1:100000 IJ SOLN
INTRAMUSCULAR | Status: AC
  Filled 2020-02-23: qty 2

## 2020-02-23 MED ORDER — FENTANYL CITRATE (PF) 100 MCG/2ML IJ SOLN: 25.0000 ug | INTRAMUSCULAR | Status: DC | PRN

## 2020-02-23 MED ORDER — CEFAZOLIN SODIUM-DEXTROSE 2-4 GM/100ML-% IV SOLN
2.0000 g | INTRAVENOUS | Status: AC
  Administered 2020-02-23: 2 g via INTRAVENOUS

## 2020-02-23 MED ORDER — MIDAZOLAM HCL 2 MG/2ML IJ SOLN
INTRAMUSCULAR | Status: AC
  Filled 2020-02-23: qty 2

## 2020-02-23 MED ORDER — LIDOCAINE-EPINEPHRINE 1 %-1:100000 IJ SOLN
INTRAMUSCULAR | Status: DC | PRN
  Administered 2020-02-23: 7 mL

## 2020-02-23 MED ORDER — CEFAZOLIN SODIUM-DEXTROSE 2-4 GM/100ML-% IV SOLN
INTRAVENOUS | Status: AC
  Filled 2020-02-23: qty 100

## 2020-02-23 MED ORDER — FENTANYL CITRATE (PF) 100 MCG/2ML IJ SOLN
INTRAMUSCULAR | Status: AC
  Filled 2020-02-23: qty 2

## 2020-02-23 MED ORDER — EPINEPHRINE PF 1 MG/ML IJ SOLN
INTRAMUSCULAR | Status: AC
  Filled 2020-02-23: qty 1

## 2020-02-23 MED ORDER — ACETAMINOPHEN 325 MG RE SUPP: 650.0000 mg | RECTAL | Status: DC | PRN

## 2020-02-23 MED ORDER — FENTANYL CITRATE (PF) 100 MCG/2ML IJ SOLN
INTRAMUSCULAR | Status: DC | PRN
Start: 2020-02-23 — End: 2020-02-23
  Administered 2020-02-23 (×3): 25 ug via INTRAVENOUS

## 2020-02-23 MED ORDER — SODIUM CHLORIDE 0.9 % IV SOLN: 250.0000 mL | INTRAVENOUS | Status: DC | PRN

## 2020-02-23 MED ORDER — PROPOFOL 10 MG/ML IV BOLUS
INTRAVENOUS | Status: AC
  Filled 2020-02-23: qty 40

## 2020-02-23 MED ORDER — BUPIVACAINE HCL (PF) 0.25 % IJ SOLN
INTRAMUSCULAR | Status: AC
  Filled 2020-02-23: qty 60

## 2020-02-23 MED ORDER — LIDOCAINE 2% (20 MG/ML) 5 ML SYRINGE
INTRAMUSCULAR | Status: DC | PRN
  Administered 2020-02-23: 40 mg via INTRAVENOUS

## 2020-02-23 MED ORDER — OXYCODONE HCL 5 MG PO TABS: 5.0000 mg | ORAL_TABLET | Freq: Once | ORAL | Status: DC | PRN

## 2020-02-23 MED ORDER — OXYCODONE HCL 5 MG PO TABS: 5.0000 mg | ORAL_TABLET | ORAL | Status: DC | PRN

## 2020-02-23 MED ORDER — ACETAMINOPHEN 325 MG PO TABS: 650.0000 mg | ORAL_TABLET | ORAL | Status: DC | PRN

## 2020-02-23 MED ORDER — OXYCODONE HCL 5 MG/5ML PO SOLN: 5.0000 mg | Freq: Once | ORAL | Status: DC | PRN

## 2020-02-23 MED ORDER — LIDOCAINE HCL (PF) 1 % IJ SOLN
INTRAMUSCULAR | Status: AC
  Filled 2020-02-23: qty 60

## 2020-02-23 MED ORDER — ONDANSETRON HCL 4 MG/2ML IJ SOLN: 4.0000 mg | Freq: Once | INTRAMUSCULAR | Status: DC | PRN

## 2020-02-23 SURGICAL SUPPLY — 58 items
ADH SKN CLS APL DERMABOND .7 (GAUZE/BANDAGES/DRESSINGS) ×1
BAG DRN INLT TBG SET TISS ACC (MISCELLANEOUS) ×1
BINDER ABDOMINAL  9 SM 30-45 (SOFTGOODS)
BINDER ABDOMINAL 10 UNV 27-48 (MISCELLANEOUS) IMPLANT
BINDER ABDOMINAL 12 SM 30-45 (SOFTGOODS) ×3 IMPLANT
BINDER ABDOMINAL 9 SM 30-45 (SOFTGOODS) IMPLANT
BINDER BREAST LRG (GAUZE/BANDAGES/DRESSINGS) ×3 IMPLANT
BINDER BREAST MEDIUM (GAUZE/BANDAGES/DRESSINGS) IMPLANT
BINDER BREAST XLRG (GAUZE/BANDAGES/DRESSINGS) IMPLANT
BINDER BREAST XXLRG (GAUZE/BANDAGES/DRESSINGS) IMPLANT
BLADE HEX COATED 2.75 (ELECTRODE) IMPLANT
BLADE SURG 15 STRL LF DISP TIS (BLADE) ×1 IMPLANT
BLADE SURG 15 STRL SS (BLADE) ×3
BNDG GAUZE ELAST 4 BULKY (GAUZE/BANDAGES/DRESSINGS) IMPLANT
COVER BACK TABLE 60X90IN (DRAPES) ×3 IMPLANT
COVER MAYO STAND STRL (DRAPES) ×3 IMPLANT
COVER WAND RF STERILE (DRAPES) IMPLANT
DECANTER SPIKE VIAL GLASS SM (MISCELLANEOUS) ×3 IMPLANT
DERMABOND ADVANCED (GAUZE/BANDAGES/DRESSINGS) ×2
DERMABOND ADVANCED .7 DNX12 (GAUZE/BANDAGES/DRESSINGS) ×1 IMPLANT
DRAPE LAPAROSCOPIC ABDOMINAL (DRAPES) ×3 IMPLANT
DRSG PAD ABDOMINAL 8X10 ST (GAUZE/BANDAGES/DRESSINGS) ×6 IMPLANT
ELECT REM PT RETURN 9FT ADLT (ELECTROSURGICAL) ×3
ELECTRODE REM PT RTRN 9FT ADLT (ELECTROSURGICAL) ×1 IMPLANT
EXTRACTOR CANIST REVOLVE STRL (CANNISTER) IMPLANT
GLOVE BIO SURGEON STRL SZ 6.5 (GLOVE) ×8 IMPLANT
GLOVE BIO SURGEONS STRL SZ 6.5 (GLOVE) ×4
GLOVE BIOGEL M 6.5 STRL (GLOVE) ×3 IMPLANT
GLOVE BIOGEL PI IND STRL 6.5 (GLOVE) ×3 IMPLANT
GLOVE BIOGEL PI INDICATOR 6.5 (GLOVE) ×6
GLOVE ECLIPSE 6.5 STRL STRAW (GLOVE) ×6 IMPLANT
GOWN STRL REUS W/ TWL LRG LVL3 (GOWN DISPOSABLE) ×4 IMPLANT
GOWN STRL REUS W/TWL LRG LVL3 (GOWN DISPOSABLE) ×12
IV LACTATED RINGERS 1000ML (IV SOLUTION) ×9 IMPLANT
LINER CANISTER 1000CC FLEX (MISCELLANEOUS) ×3 IMPLANT
NDL SAFETY ECLIPSE 18X1.5 (NEEDLE) ×1 IMPLANT
NEEDLE HYPO 18GX1.5 SHARP (NEEDLE) ×3
NEEDLE HYPO 25X1 1.5 SAFETY (NEEDLE) IMPLANT
PACK BASIN DAY SURGERY FS (CUSTOM PROCEDURE TRAY) ×3 IMPLANT
PAD ALCOHOL SWAB (MISCELLANEOUS) ×3 IMPLANT
PENCIL SMOKE EVACUATOR (MISCELLANEOUS) IMPLANT
SLEEVE SCD COMPRESS KNEE MED (MISCELLANEOUS) ×3 IMPLANT
SPONGE LAP 18X18 RF (DISPOSABLE) ×3 IMPLANT
SUT MNCRL AB 4-0 PS2 18 (SUTURE) IMPLANT
SUT MON AB 5-0 PS2 18 (SUTURE) ×6 IMPLANT
SYR 10ML LL (SYRINGE) ×15 IMPLANT
SYR 3ML 18GX1 1/2 (SYRINGE) IMPLANT
SYR 50ML LL SCALE MARK (SYRINGE) ×12 IMPLANT
SYR CONTROL 10ML LL (SYRINGE) ×3 IMPLANT
SYR TOOMEY 50ML (SYRINGE) ×6 IMPLANT
SYSTEM FAT FILTRATION 250 (MISCELLANEOUS) ×3 IMPLANT
TOWEL GREEN STERILE FF (TOWEL DISPOSABLE) ×6 IMPLANT
TRAY DSU PREP LF (CUSTOM PROCEDURE TRAY) ×3 IMPLANT
TUBE CONNECTING 20'X1/4 (TUBING) ×1
TUBE CONNECTING 20X1/4 (TUBING) ×2 IMPLANT
TUBING INFILTRATION IT-10001 (TUBING) ×3 IMPLANT
TUBING SET GRADUATE ASPIR 12FT (MISCELLANEOUS) ×3 IMPLANT
UNDERPAD 30X36 HEAVY ABSORB (UNDERPADS AND DIAPERS) ×6 IMPLANT

## 2020-02-23 NOTE — Anesthesia Postprocedure Evaluation (Signed)
Anesthesia Post Note  Patient: CARMINE YOUNGBERG  Procedure(s) Performed: Fat filling to bilateral breasts for symmetry (Bilateral Breast)     Patient location during evaluation: PACU Anesthesia Type: General Level of consciousness: awake and alert Pain management: pain level controlled Vital Signs Assessment: post-procedure vital signs reviewed and stable Respiratory status: spontaneous breathing, nonlabored ventilation, respiratory function stable and patient connected to nasal cannula oxygen Cardiovascular status: blood pressure returned to baseline and stable Postop Assessment: no apparent nausea or vomiting Anesthetic complications: no   No complications documented.  Last Vitals:  Vitals:   02/23/20 1330 02/23/20 1349  BP: 113/85 125/73  Pulse: 62 63  Resp: 17 16  Temp:  36.4 C  SpO2: 100% 99%    Last Pain:  Vitals:   02/23/20 1349  TempSrc:   PainSc: 0-No pain                 Bricia Taher

## 2020-02-23 NOTE — Discharge Instructions (Signed)

## 2020-02-23 NOTE — Op Note (Signed)
DATE OF OPERATION: 02/23/2020  LOCATION: [facility]  PREOPERATIVE DIAGNOSIS: Breast asymmetry after mastectomies for breast cancer  POSTOPERATIVE DIAGNOSIS: Same  PROCEDURE: Lipofilling of bilateral breasts for improved symmetry after breast cancer treatment  SURGEON: Angas Isabell Sanger Tawania Daponte, DO  ASSISTANT: Phoebe Sharps, PA  EBL: 10 cc  CONDITION: Stable  COMPLICATIONS: None  INDICATION: The patient, Kelsey Klein, is a 49 y.o. female born on [birth year], is here for treatment of bilateral breast asymmetry.  The patient underwent bilateral mastectomies.  She had expanders placed and then switched for the implants.  She has some loss of volume and contour irregularies.   PROCEDURE DETAILS:  The patient was seen prior to surgery and marked.  The IV antibiotics were given. The patient was taken to the operating room and given a general anesthetic. A standard time out was performed and all information was confirmed by those in the room. SCDs were placed.   The patient was prepped and draped.  Local with epinephrine was placed in the umbilical area.  Tumescent was infused into the upper and lower abdominal fat layer.  We waited for the local to take effect.  The abdomen was then liposuctioned and the fat harvested.  The abdomen was very fibrous and there was not a lot of fat available.  It was very difficult to get the cannula and around the lower abdominal area.  This might be related to the sculpting that she had done a few weeks ago.  The harvested fat was prepared according to the manufacture guidelines with 2 normal saline rinses.  The fat was then collected into 10 cc syringes.  Approximately 40 cc of fat was utilized in total.  On the left side, 30 cc was placed in the medial breast and 10 cc in the right medial breast.  This was done through a small incision on the medial aspect of each breast.  All incisions were closed with the 5-0 Monocryl.   The patient was allowed  to wake up and taken to recovery room in stable condition at the end of the case. The family was notified at the end of the case.   The advanced practice practitioner (APP) assisted throughout the case.  The APP was essential in retraction and counter traction when needed to make the case progress smoothly.  This retraction and assistance made it possible to see the tissue plans for the procedure.  The assistance was needed for blood control, tissue re-approximation and assisted with closure of the incision site.

## 2020-02-23 NOTE — Transfer of Care (Signed)
Immediate Anesthesia Transfer of Care Note  Patient: Kelsey Klein  Procedure(s) Performed: Fat filling to bilateral breasts for symmetry (Bilateral Breast)  Patient Location: PACU  Anesthesia Type:General  Level of Consciousness: drowsy  Airway & Oxygen Therapy: Patient Spontanous Breathing and Patient connected to nasal cannula oxygen  Post-op Assessment: Report given to RN and Post -op Vital signs reviewed and stable  Post vital signs: Reviewed and stable  Last Vitals:  Vitals Value Taken Time  BP 133/77 02/23/20 1300  Temp    Pulse 96 02/23/20 1301  Resp 20 02/23/20 1301  SpO2 100 % 02/23/20 1301  Vitals shown include unvalidated device data.  Last Pain:  Vitals:   02/23/20 0741  TempSrc: Oral  PainSc: 0-No pain         Complications: No complications documented.

## 2020-02-23 NOTE — Interval H&P Note (Signed)
History and Physical Interval Note:  02/23/2020 10:39 AM  Kelsey Klein  has presented today for surgery, with the diagnosis of Status post bilateral breast reconstruction.  The various methods of treatment have been discussed with the patient and family. After consideration of risks, benefits and other options for treatment, the patient has consented to  Procedure(s): Fat filling to bilateral breasts for symmetry (Bilateral) as a surgical intervention.  The patient's history has been reviewed, patient examined, no change in status, stable for surgery.  I have reviewed the patient's chart and labs.  Questions were answered to the patient's satisfaction.     Loel Lofty Laniesha Das

## 2020-02-23 NOTE — Anesthesia Procedure Notes (Signed)
Procedure Name: LMA Insertion Date/Time: 02/23/2020 11:00 AM Performed by: Imagene Riches, CRNA Pre-anesthesia Checklist: Patient identified, Emergency Drugs available, Suction available and Patient being monitored Patient Re-evaluated:Patient Re-evaluated prior to induction Oxygen Delivery Method: Circle System Utilized Preoxygenation: Pre-oxygenation with 100% oxygen Induction Type: IV induction Ventilation: Mask ventilation without difficulty LMA: LMA inserted LMA Size: 4.0 Number of attempts: 1 Airway Equipment and Method: Bite block Placement Confirmation: positive ETCO2 Tube secured with: Tape Dental Injury: Teeth and Oropharynx as per pre-operative assessment

## 2020-02-24 ENCOUNTER — Encounter (HOSPITAL_BASED_OUTPATIENT_CLINIC_OR_DEPARTMENT_OTHER): Payer: Self-pay | Admitting: Plastic Surgery

## 2020-02-29 ENCOUNTER — Encounter: Payer: Self-pay | Admitting: Plastic Surgery

## 2020-02-29 ENCOUNTER — Ambulatory Visit (INDEPENDENT_AMBULATORY_CARE_PROVIDER_SITE_OTHER): Payer: Medicaid Other | Admitting: Plastic Surgery

## 2020-02-29 ENCOUNTER — Other Ambulatory Visit: Payer: Self-pay

## 2020-02-29 VITALS — BP 133/94 | HR 68 | Temp 98.2°F

## 2020-02-29 DIAGNOSIS — N651 Disproportion of reconstructed breast: Secondary | ICD-10-CM

## 2020-02-29 NOTE — Progress Notes (Signed)
The patient is a 49 year old female here for follow-up after undergoing that grafting to her bilateral breast.  Overall she is doing extremely well she has multiple binders on.  Bruising and swelling is minimal.  Pain is well controlled.  There is no sign of infection.  She can go into just the sports bra and one abdominal binder.  She is fine to shower.

## 2020-03-14 ENCOUNTER — Encounter: Payer: Medicaid Other | Admitting: Plastic Surgery

## 2020-03-19 NOTE — Progress Notes (Signed)
Patient is a 49 year old female who presents today after undergoing lipofilling for improved symmetry after breast cancer treatment on 02/23/2020 with Dr. Marla Roe.  ~ 4 weeks PO Patient reports she is feeling well, but has noticed that a lot of the fat that was injected has gone away and the dimple on the left breast is visible again. Reports it is better than before surgery. She reports she does not want another surgery at this time. Incisions on bilateral breasts and abdomen are healing well, c/d/i. No signs of infection, redness, or drainage.  Patient denies fever/chills, nausea/vomiting.  Suture knots removed today.  Continue to wear compression garments for 2 more weeks.  Continue to avoid heavy lifting for 2 more weeks and then may gradually increase.  Patient would like to follow-up as needed.  Call office with any questions/concerns.  Pictures were obtained of the patient and placed in the chart with the patient's or guardian's permission.  The [place] was signed into law in 2016 which includes the topic of electronic health records.  This provides immediate access to information in MyChart.  This includes consultation notes, operative notes, office notes, lab results and pathology reports.  If you have any questions about what you read please let us know at your next visit or call us at the office.  We are right here with you.

## 2020-03-22 ENCOUNTER — Encounter: Payer: Self-pay | Admitting: Plastic Surgery

## 2020-03-22 ENCOUNTER — Other Ambulatory Visit: Payer: Self-pay

## 2020-03-22 ENCOUNTER — Ambulatory Visit (INDEPENDENT_AMBULATORY_CARE_PROVIDER_SITE_OTHER): Payer: Medicaid Other | Admitting: Plastic Surgery

## 2020-03-22 VITALS — BP 120/79 | HR 73 | Temp 98.2°F

## 2020-03-22 DIAGNOSIS — Z9889 Other specified postprocedural states: Secondary | ICD-10-CM

## 2020-03-28 ENCOUNTER — Encounter: Payer: Medicaid Other | Admitting: Plastic Surgery

## 2021-01-22 ENCOUNTER — Telehealth: Payer: Medicaid Other | Admitting: Oncology

## 2021-01-29 ENCOUNTER — Inpatient Hospital Stay: Payer: Medicaid Other | Attending: Oncology | Admitting: Oncology

## 2021-01-29 DIAGNOSIS — Z170 Estrogen receptor positive status [ER+]: Secondary | ICD-10-CM | POA: Insufficient documentation

## 2021-01-29 DIAGNOSIS — C50512 Malignant neoplasm of lower-outer quadrant of left female breast: Secondary | ICD-10-CM | POA: Insufficient documentation

## 2021-01-29 DIAGNOSIS — Z79811 Long term (current) use of aromatase inhibitors: Secondary | ICD-10-CM | POA: Diagnosis not present

## 2021-01-29 DIAGNOSIS — C50919 Malignant neoplasm of unspecified site of unspecified female breast: Secondary | ICD-10-CM

## 2021-01-29 MED ORDER — ANASTROZOLE 1 MG PO TABS: 1.0000 mg | ORAL_TABLET | Freq: Every day | ORAL | 3 refills | Status: DC

## 2021-01-29 NOTE — Progress Notes (Signed)
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  I connected with Kelsey Klein on 01/29/21 at  1:30 PM EDT by telephone visit and verified that I am speaking with the correct person using two identifiers.   I discussed the limitations, risks, security and privacy concerns of performing an evaluation and management service by telemedicine and the availability of in-person appointments. I also discussed with the patient that there may be a patient responsible charge related to this service. The patient expressed understanding and agreed to proceed.   Other persons participating in the visit and their role in the encounter: NP and patient   Patient's location: Home  Provider's location: Clinic    Chief Complaint: Follow-up   INTERVAL HISTORY: Kelsey Klein is a 50 year old female with past medical history significant for hormone positive breast cancer who is followed by Dr. Grayland Ormond and is currently not on antihormone therapy secondary to intolerance.  Previously tried tamoxifen but had significant mood swings and depression.  In the interim, she had fat filling to bilateral breast for asymmetry concerns by Dr. Marla Roe on 02/23/2020.  She appears to have tolerated well. Unfortunately the asymmetry only lasted for couple months and her left breast continues to not retain the fat as Dr. Marla Roe would like.  Overall, she is doing well.  Denies any new concerns at this time.   REVIEW OF SYSTEMS:   Review of Systems  Constitutional: Negative.  Negative for fever, malaise/fatigue and weight loss.  Respiratory: Negative.  Negative for cough and  shortness of breath.   Cardiovascular: Negative.  Negative for chest pain and leg swelling.  Gastrointestinal: Negative.  Negative for abdominal pain and constipation.  Genitourinary: Negative.  Negative for dysuria.  Musculoskeletal: Negative.  Negative for back pain.  Skin: Negative.  Negative for rash.  Neurological: Negative.  Negative for focal weakness, weakness and headaches.  Psychiatric/Behavioral: Negative.  The patient is not nervous/anxious.    As per HPI. Otherwise, a complete review of systems is negative.  PAST MEDICAL HISTORY: Past Medical History:  Diagnosis Date   Breast cancer, stage 1, estrogen receptor negative, right ([place]) 08/19/2015    18 mm; pT1c  pN0 (0/6nodes positive). ER/PR +; Her 2 neu not overexpressed   Family history of breast cancer     PAST SURGICAL HISTORY: Past Surgical History:  Procedure Laterality Date   BREAST BIOPSY Left 05/27/2017   U/S core Bx- invasive mammary ER/PR positive Her2 negative   BREAST BIOPSY Left 05/27/2017   Affirm Bx- DCIS   BREAST RECONSTRUCTION WITH PLACEMENT OF TISSUE EXPANDER AND FLEX HD (ACELLULAR HYDRATED DERMIS) Bilateral 08/18/2017   Procedure: BREAST RECONSTRUCTION WITH PLACEMENT OF TISSUE EXPANDER AND FLEX HD (ACELLULAR HYDRATED DERMIS);  Surgeon: Wallace Going, DO;  Location: [facility];  Service: Plastics;  Laterality: Bilateral;   LIPOSUCTION WITH LIPOFILLING Bilateral 05/21/2018   Procedure: liposuction with fat grafting and revolve;  Surgeon: Wallace Going, DO;  Location: [place];  Service: Plastics;  Laterality: Bilateral;   LIPOSUCTION WITH LIPOFILLING Bilateral 02/23/2020   Procedure: Fat filling to bilateral breasts for symmetry;  Surgeon: Wallace Going, DO;  Location: [place];  Service: Plastics;  Laterality:  Bilateral;   MASTECTOMY W/ SENTINEL NODE BIOPSY Bilateral 08/18/2017   Procedure: MASTECTOMY WITH SENTINEL LYMPH NODE BIOPSY,FROZEN SECTION;  Surgeon:  Robert Bellow, MD;  Location: [facility];  Service: General;  Laterality: Bilateral;   REMOVAL OF TISSUE EXPANDER AND PLACEMENT OF IMPLANT Bilateral 12/10/2017   Procedure: REMOVAL OF TISSUE EXPANDERS AND PLACEMENT OF IMPLANTS;  Surgeon: Wallace Going, DO;  Location: [place];  Service: Plastics;  Laterality: Bilateral;    FAMILY HISTORY: Family History  Problem Relation Age of Onset   Arthritis Mother    Cirrhosis Father        deceased 74; adopted   Hypertension Father    Hypertension Sister    Alzheimer's disease Maternal Grandmother    Breast cancer Paternal Aunt        age at dx unknown   Breast cancer Paternal Aunt        age at dx unknown    ADVANCED DIRECTIVES (Y/N):  N  HEALTH MAINTENANCE: Social History   Tobacco Use   Smoking status: Never   Smokeless tobacco: Never  Vaping Use   Vaping Use: Never used  Substance Use Topics   Alcohol use: Not Currently   Drug use: No     Colonoscopy:  PAP:  Bone density:  Lipid panel:  No Known Allergies  Current Outpatient Medications  Medication Sig Dispense Refill   ibuprofen (ADVIL) 600 MG tablet Take 1 tablet (600 mg total) by mouth every 6 (six) hours as needed for mild pain or moderate pain. For use AFTER surgery 30 tablet 0   tamoxifen (NOLVADEX) 20 MG tablet Take 1 tablet (20 mg total) by mouth daily. (Patient not taking: Reported on 02/29/2020) 90 tablet 3   No current facility-administered medications for this visit.    OBJECTIVE: There were no vitals filed for this visit.    There is no height or weight on file to calculate BMI.    ECOG FS:0 - Asymptomatic  Physical Exam Neurological:     Mental Status: She is alert and oriented to person, place, and time.    LAB RESULTS:  No results found for: NA, K, CL, CO2, GLUCOSE, BUN, CREATININE, CALCIUM, PROT, ALBUMIN, AST, ALT, ALKPHOS, BILITOT, GFRNONAA, GFRAA  No results found for: WBC, NEUTROABS, HGB, HCT, MCV,  PLT   STUDIES: No results found.  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  Oncotype DX score 13, low risk of recurrence.  PLAN:    1. 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 Marla Roe.  She did not require adjuvant XRT or chemotherapy secondary to a low Oncotype score.  She discontinued tamoxifen secondary to depression and mood swings.  She did not wish to try an aromatase inhibitor at that time. Does not need mammogram.  She is now agreeable to try one of the aromatase inhibitors.  Prescription sent to her pharmacy for Arimidex 1 mg daily.  I will have her return to clinic in 6 to 8 weeks to check lab work and again in 6 months for labs and to see Dr. Grayland Ormond.  I spent 20 minutes dedicated to the care of this patient (face-to-face and non-face-to-face) on the date of the encounter to include what is described in the assessment and plan.   Patient expressed understanding and was in agreement with this plan. She  also understands that She can call clinic at any time with any questions, concerns, or complaints.   Cancer Staging Primary cancer of lower-outer quadrant of left breast [facility]) Staging form: Breast, AJCC 8th Edition - Clinical stage from 05/31/2017: Stage IB (cT2, cN0, cM0, G1, ER+, PR+, HER2-) - Signed by Lloyd Huger, MD on 05/31/2017 Histologic grading system: 3 grade system Laterality: Left - Pathologic stage from 09/26/2017: Stage IA (pT1c, pN0, cM0, G2, ER+, PR+, HER2-, Oncotype DX score: 13) - Signed by Lloyd Huger, MD on 09/26/2017 Neoadjuvant therapy: No Multigene prognostic tests performed: Oncotype DX Recurrence score range: Greater than or equal to 11 Histologic grading system: 3 grade system Laterality: Left   Jacquelin Hawking, NP   01/29/2021  12:52 PM

## 2021-03-26 ENCOUNTER — Inpatient Hospital Stay: Payer: Medicaid Other | Attending: Oncology

## 2021-03-26 ENCOUNTER — Other Ambulatory Visit: Payer: Self-pay

## 2021-03-26 DIAGNOSIS — C50512 Malignant neoplasm of lower-outer quadrant of left female breast: Secondary | ICD-10-CM | POA: Diagnosis not present

## 2021-03-26 DIAGNOSIS — Z170 Estrogen receptor positive status [ER+]: Secondary | ICD-10-CM | POA: Insufficient documentation

## 2021-03-26 DIAGNOSIS — C50919 Malignant neoplasm of unspecified site of unspecified female breast: Secondary | ICD-10-CM

## 2021-03-26 LAB — COMPREHENSIVE METABOLIC PANEL
ALT: 14 U/L (ref 0–44)
AST: 23 U/L (ref 15–41)
Albumin: 4.1 g/dL (ref 3.5–5.0)
Alkaline Phosphatase: 46 U/L (ref 38–126)
Anion gap: 7 (ref 5–15)
BUN: 15 mg/dL (ref 6–20)
CO2: 29 mmol/L (ref 22–32)
Calcium: 8.9 mg/dL (ref 8.9–10.3)
Chloride: 100 mmol/L (ref 98–111)
Creatinine, Ser: 0.93 mg/dL (ref 0.44–1.00)
GFR, Estimated: >60 mL/min (ref >60)
Glucose, Bld: 94 mg/dL (ref 70–99)
Potassium: 4 mmol/L (ref 3.5–5.1)
Sodium: 136 mmol/L (ref 135–145)
Total Bilirubin: 0.8 mg/dL (ref 0.3–1.2)
Total Protein: 7.3 g/dL (ref 6.5–8.1)

## 2021-03-26 LAB — CBC WITH DIFFERENTIAL/PLATELET
Abs Immature Granulocytes: 0.01 10*3/uL (ref 0.00–0.07)
Basophils Absolute: 0 10*3/uL (ref 0.0–0.1)
Basophils Relative: 1 %
Eosinophils Absolute: 0.2 10*3/uL (ref 0.0–0.5)
Eosinophils Relative: 4 %
HCT: 44.8 % (ref 36.0–46.0)
Hemoglobin: 15.5 g/dL — ABNORMAL HIGH (ref 12.0–15.0)
Immature Granulocytes: 0 %
Lymphocytes Relative: 29 %
Lymphs Abs: 1.2 10*3/uL (ref 0.7–4.0)
MCH: 31.1 pg (ref 26.0–34.0)
MCHC: 34.6 g/dL (ref 30.0–36.0)
MCV: 89.8 fL (ref 80.0–100.0)
Monocytes Absolute: 0.3 10*3/uL (ref 0.1–1.0)
Monocytes Relative: 7 %
Neutro Abs: 2.5 10*3/uL (ref 1.7–7.7)
Neutrophils Relative %: 59 %
Platelets: 212 10*3/uL (ref 150–400)
RBC: 4.99 MIL/uL (ref 3.87–5.11)
RDW: 12.7 % (ref 11.5–15.5)
WBC: 4.2 10*3/uL (ref 4.0–10.5)
nRBC: 0 % (ref 0.0–0.2)

## 2021-08-10 NOTE — Progress Notes (Signed)
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  INTERVAL HISTORY: Patient returns to clinic today for routine yearly evaluation.  She is tolerating anastrozole well without significant side effects.  She currently feels well and is asymptomatic. She has no neurologic complaints.  She denies any recent fevers or illnesses.  She has a good appetite and denies weight loss.  She denies any chest pain, shortness of breath, cough, or hemoptysis.  She denies any nausea, vomiting, constipation, or diarrhea.  She has no urinary complaints.  Patient offers no specific complaints today.  REVIEW OF SYSTEMS:   Review of Systems  Constitutional: Negative.  Negative for fever, malaise/fatigue and weight loss.  Respiratory: Negative.  Negative for cough and shortness of breath.   Cardiovascular: Negative.  Negative for chest pain and leg swelling.  Gastrointestinal: Negative.  Negative for abdominal pain and constipation.  Genitourinary: Negative.  Negative for dysuria.  Musculoskeletal: Negative.  Negative for back pain.  Skin: Negative.  Negative for rash.  Neurological: Negative.  Negative for focal weakness, weakness and headaches.  Psychiatric/Behavioral: Negative.  The patient is not nervous/anxious.    As per HPI. Otherwise, a complete review of systems is negative.  PAST MEDICAL HISTORY: Past Medical History:  Diagnosis Date   Breast cancer, stage 1, estrogen receptor negative, right ([place]) 08/19/2015    18 mm; pT1c  pN0 (0/6nodes positive). ER/PR +; Her 2 neu not overexpressed   Family history of breast cancer     PAST SURGICAL HISTORY: Past Surgical  History:  Procedure Laterality Date   BREAST BIOPSY Left 05/27/2017   U/S core Bx- invasive mammary ER/PR positive Her2 negative   BREAST BIOPSY Left 05/27/2017   Affirm Bx- DCIS   BREAST RECONSTRUCTION WITH PLACEMENT OF TISSUE EXPANDER AND FLEX HD (ACELLULAR HYDRATED DERMIS) Bilateral 08/18/2017   Procedure: BREAST RECONSTRUCTION WITH PLACEMENT OF TISSUE EXPANDER AND FLEX HD (ACELLULAR HYDRATED DERMIS);  Surgeon: Wallace Going, DO;  Location: [facility];  Service: Plastics;  Laterality: Bilateral;   LIPOSUCTION WITH LIPOFILLING Bilateral 05/21/2018   Procedure: liposuction with fat grafting and revolve;  Surgeon: Wallace Going, DO;  Location: [place];  Service: Plastics;  Laterality: Bilateral;   LIPOSUCTION WITH LIPOFILLING Bilateral 02/23/2020   Procedure: Fat filling to bilateral breasts for symmetry;  Surgeon: Wallace Going, DO;  Location: [place];  Service: Plastics;  Laterality: Bilateral;   MASTECTOMY W/ SENTINEL NODE BIOPSY Bilateral 08/18/2017   Procedure: MASTECTOMY WITH SENTINEL LYMPH NODE BIOPSY,FROZEN SECTION;  Surgeon: Robert Bellow, MD;  Location: [facility];  Service: General;  Laterality: Bilateral;   REMOVAL OF TISSUE EXPANDER AND PLACEMENT OF IMPLANT Bilateral 12/10/2017   Procedure: REMOVAL OF TISSUE EXPANDERS AND PLACEMENT OF IMPLANTS;  Surgeon: Wallace Going, DO;  Location: [place];  Service: Plastics;  Laterality: Bilateral;    FAMILY HISTORY: Family History  Problem Relation Age of Onset   Arthritis Mother    Cirrhosis Father        deceased 43; adopted   Hypertension Father    Hypertension Sister    Alzheimer's disease Maternal Grandmother    Breast cancer Paternal Aunt  age at dx unknown   Breast cancer Paternal Aunt        age at dx unknown    ADVANCED DIRECTIVES (Y/N):  N  HEALTH MAINTENANCE: Social History   Tobacco Use   Smoking status: Never   Smokeless tobacco:  Never  Vaping Use   Vaping Use: Never used  Substance Use Topics   Alcohol use: Not Currently   Drug use: No     Colonoscopy:  PAP:  Bone density:  Lipid panel:  No Known Allergies  Current Outpatient Medications  Medication Sig Dispense Refill   anastrozole (ARIMIDEX) 1 MG tablet Take 1 tablet (1 mg total) by mouth daily. 90 tablet 3   ibuprofen (ADVIL) 600 MG tablet Take 1 tablet (600 mg total) by mouth every 6 (six) hours as needed for mild pain or moderate pain. For use AFTER surgery 30 tablet 0   No current facility-administered medications for this visit.    OBJECTIVE: Vitals:   08/14/21 1024  BP: 119/86  Pulse: (!) 58  Resp: 16  Temp: (!) 95.9 F (35.5 C)  SpO2: 98%     Body mass index is 28.15 kg/m.    ECOG FS:0 - Asymptomatic  General: Well-developed, well-nourished, no acute distress. Eyes: Pink conjunctiva, anicteric sclera. HEENT: Normocephalic, moist mucous membranes. Breast: Bilateral mastectomy with reconstruction. Lungs: No audible wheezing or coughing. Heart: Regular rate and rhythm. Abdomen: Soft, nontender, no obvious distention. Musculoskeletal: No edema, cyanosis, or clubbing. Neuro: Alert, answering all questions appropriately. Cranial nerves grossly intact. Skin: No rashes or petechiae noted. Psych: Normal affect.   LAB RESULTS:  Lab Results  Component Value Date   NA 136 08/14/2021   K 4.1 08/14/2021   CL 103 08/14/2021   CO2 26 08/14/2021   GLUCOSE 91 08/14/2021   BUN 17 08/14/2021   CREATININE 0.95 08/14/2021   CALCIUM 9.0 08/14/2021   PROT 7.5 08/14/2021   ALBUMIN 4.1 08/14/2021   AST 24 08/14/2021   ALT 19 08/14/2021   ALKPHOS 50 08/14/2021   BILITOT 0.6 08/14/2021   GFRNONAA >60 08/14/2021    Lab Results  Component Value Date   WBC 4.9 08/14/2021   NEUTROABS 2.9 08/14/2021   HGB 15.1 (H) 08/14/2021   HCT 44.6 08/14/2021   MCV 89.6 08/14/2021   PLT 225 08/14/2021     STUDIES: No results found.  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  Oncotype DX score 13, low risk of recurrence.  PLAN:    1. 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  Patient underwent bilateral mastectomy on August 18, 2017.  Because she had bilateral mastectomy, she did not require adjuvant XRT.  She had a low risk Oncotype score therefore chemotherapy was unnecessary.  Patient was initially on tamoxifen, but this was discontinued secondary to increasing depression and mood changes.  She was transitioned to anastrozole and is tolerating treatment well.  Because she has had bilateral mastectomies, no further mammograms are necessary.  Continue treatment for a total of 5 years completing in March 2024.  Patient will require bone marrow density in the near future.  She continues to request less frequent follow-up, therefore patient will return to clinic in 1 year with further evaluation.     I spent a total of 20 minutes reviewing chart data, face-to-face evaluation with the patient, counseling and coordination of  care as detailed above.   Patient expressed understanding and was in agreement with this plan. She also understands that She can call clinic at any time with any questions, concerns, or complaints.    Cancer Staging  Primary cancer of lower-outer quadrant of left breast [facility]) Staging form: Breast, AJCC 8th Edition - Clinical stage from 05/31/2017: Stage IB (cT2, cN0, cM0, G1, ER+, PR+, HER2-) - Signed by Lloyd Huger, MD on 05/31/2017 Histologic grading system: 3 grade system Laterality: Left - Pathologic stage from 09/26/2017: Stage IA (pT1c, pN0, cM0, G2, ER+, PR+, HER2-, Oncotype DX score: 13) - Signed by Lloyd Huger, MD on 09/26/2017 Neoadjuvant therapy: No Multigene prognostic tests performed: Oncotype DX Recurrence  score range: Greater than or equal to 11 Histologic grading system: 3 grade system Laterality: Left   Lloyd Huger, MD   08/14/2021 10:58 AM

## 2021-08-14 ENCOUNTER — Inpatient Hospital Stay: Payer: Medicaid Other | Attending: Oncology

## 2021-08-14 ENCOUNTER — Inpatient Hospital Stay (HOSPITAL_BASED_OUTPATIENT_CLINIC_OR_DEPARTMENT_OTHER): Payer: Medicaid Other | Admitting: Oncology

## 2021-08-14 ENCOUNTER — Other Ambulatory Visit: Payer: Self-pay

## 2021-08-14 VITALS — BP 119/86 | HR 58 | Temp 95.9°F | Resp 16 | Ht 62.0 in | Wt 153.9 lb

## 2021-08-14 DIAGNOSIS — Z170 Estrogen receptor positive status [ER+]: Secondary | ICD-10-CM | POA: Insufficient documentation

## 2021-08-14 DIAGNOSIS — C50512 Malignant neoplasm of lower-outer quadrant of left female breast: Secondary | ICD-10-CM | POA: Insufficient documentation

## 2021-08-14 DIAGNOSIS — Z79811 Long term (current) use of aromatase inhibitors: Secondary | ICD-10-CM

## 2021-08-14 DIAGNOSIS — C50919 Malignant neoplasm of unspecified site of unspecified female breast: Secondary | ICD-10-CM

## 2021-08-14 LAB — COMPREHENSIVE METABOLIC PANEL
ALT: 19 U/L (ref 0–44)
AST: 24 U/L (ref 15–41)
Albumin: 4.1 g/dL (ref 3.5–5.0)
Alkaline Phosphatase: 50 U/L (ref 38–126)
Anion gap: 7 (ref 5–15)
BUN: 17 mg/dL (ref 6–20)
CO2: 26 mmol/L (ref 22–32)
Calcium: 9 mg/dL (ref 8.9–10.3)
Chloride: 103 mmol/L (ref 98–111)
Creatinine, Ser: 0.95 mg/dL (ref 0.44–1.00)
GFR, Estimated: >60 mL/min (ref >60)
Glucose, Bld: 91 mg/dL (ref 70–99)
Potassium: 4.1 mmol/L (ref 3.5–5.1)
Sodium: 136 mmol/L (ref 135–145)
Total Bilirubin: 0.6 mg/dL (ref 0.3–1.2)
Total Protein: 7.5 g/dL (ref 6.5–8.1)

## 2021-08-14 LAB — CBC WITH DIFFERENTIAL/PLATELET
Abs Immature Granulocytes: 0.01 10*3/uL (ref 0.00–0.07)
Basophils Absolute: 0 10*3/uL (ref 0.0–0.1)
Basophils Relative: 0 %
Eosinophils Absolute: 0.2 10*3/uL (ref 0.0–0.5)
Eosinophils Relative: 4 %
HCT: 44.6 % (ref 36.0–46.0)
Hemoglobin: 15.1 g/dL — ABNORMAL HIGH (ref 12.0–15.0)
Immature Granulocytes: 0 %
Lymphocytes Relative: 30 %
Lymphs Abs: 1.5 10*3/uL (ref 0.7–4.0)
MCH: 30.3 pg (ref 26.0–34.0)
MCHC: 33.9 g/dL (ref 30.0–36.0)
MCV: 89.6 fL (ref 80.0–100.0)
Monocytes Absolute: 0.3 10*3/uL (ref 0.1–1.0)
Monocytes Relative: 7 %
Neutro Abs: 2.9 10*3/uL (ref 1.7–7.7)
Neutrophils Relative %: 59 %
Platelets: 225 10*3/uL (ref 150–400)
RBC: 4.98 MIL/uL (ref 3.87–5.11)
RDW: 13.4 % (ref 11.5–15.5)
WBC: 4.9 10*3/uL (ref 4.0–10.5)
nRBC: 0 % (ref 0.0–0.2)

## 2021-08-14 NOTE — Progress Notes (Signed)
Right hand numbness and tingling for several months.

## 2021-10-29 ENCOUNTER — Other Ambulatory Visit: Payer: Self-pay | Admitting: Physician Assistant

## 2021-10-29 ENCOUNTER — Ambulatory Visit
Admission: RE | Admit: 2021-10-29 | Discharge: 2021-10-29 | Disposition: A | Discharge status: Home / Self Care | Payer: Medicaid Other | Source: Ambulatory Visit | Attending: Physician Assistant | Admitting: Physician Assistant

## 2021-10-29 ENCOUNTER — Ambulatory Visit
Admission: RE | Admit: 2021-10-29 | Discharge: 2021-10-29 | Disposition: A | Discharge status: Home / Self Care | Payer: Medicaid Other | Source: Home / Self Care | Attending: Physician Assistant | Admitting: Physician Assistant

## 2021-10-29 DIAGNOSIS — R079 Chest pain, unspecified: Secondary | ICD-10-CM | POA: Diagnosis not present

## 2021-10-29 DIAGNOSIS — M5412 Radiculopathy, cervical region: Secondary | ICD-10-CM

## 2021-10-29 DIAGNOSIS — C50812 Malignant neoplasm of overlapping sites of left female breast: Secondary | ICD-10-CM | POA: Diagnosis not present

## 2021-10-30 ENCOUNTER — Inpatient Hospital Stay (HOSPITAL_COMMUNITY)
Admit: 2021-10-30 | Discharge: 2021-10-30 | Disposition: A | Discharge status: Home / Self Care | Payer: Medicaid Other | Attending: Internal Medicine | Admitting: Internal Medicine

## 2021-10-30 ENCOUNTER — Other Ambulatory Visit: Payer: Self-pay

## 2021-10-30 ENCOUNTER — Observation Stay
Admission: EM | Admit: 2021-10-30 | Discharge: 2021-10-31 | Disposition: A | Discharge status: Home / Self Care | Payer: Medicaid Other | Attending: Internal Medicine | Admitting: Internal Medicine

## 2021-10-30 ENCOUNTER — Emergency Department: Payer: Medicaid Other

## 2021-10-30 ENCOUNTER — Encounter: Payer: Self-pay | Admitting: Emergency Medicine

## 2021-10-30 DIAGNOSIS — R079 Chest pain, unspecified: Secondary | ICD-10-CM | POA: Diagnosis not present

## 2021-10-30 DIAGNOSIS — R0789 Other chest pain: Secondary | ICD-10-CM | POA: Diagnosis present

## 2021-10-30 DIAGNOSIS — I214 Non-ST elevation (NSTEMI) myocardial infarction: Secondary | ICD-10-CM | POA: Diagnosis not present

## 2021-10-30 DIAGNOSIS — C50812 Malignant neoplasm of overlapping sites of left female breast: Secondary | ICD-10-CM | POA: Insufficient documentation

## 2021-10-30 DIAGNOSIS — Z803 Family history of malignant neoplasm of breast: Secondary | ICD-10-CM | POA: Diagnosis not present

## 2021-10-30 DIAGNOSIS — R778 Other specified abnormalities of plasma proteins: Secondary | ICD-10-CM

## 2021-10-30 DIAGNOSIS — Z853 Personal history of malignant neoplasm of breast: Secondary | ICD-10-CM | POA: Diagnosis not present

## 2021-10-30 DIAGNOSIS — Z9882 Breast implant status: Secondary | ICD-10-CM

## 2021-10-30 DIAGNOSIS — Z79811 Long term (current) use of aromatase inhibitors: Secondary | ICD-10-CM

## 2021-10-30 DIAGNOSIS — Z9013 Acquired absence of bilateral breasts and nipples: Secondary | ICD-10-CM

## 2021-10-30 LAB — CBC
HCT: 43.3 % (ref 36.0–46.0)
Hemoglobin: 14.5 g/dL (ref 12.0–15.0)
MCH: 29.8 pg (ref 26.0–34.0)
MCHC: 33.5 g/dL (ref 30.0–36.0)
MCV: 88.9 fL (ref 80.0–100.0)
Platelets: 237 10*3/uL (ref 150–400)
RBC: 4.87 MIL/uL (ref 3.87–5.11)
RDW: 12.9 % (ref 11.5–15.5)
WBC: 4.4 10*3/uL (ref 4.0–10.5)
nRBC: 0 % (ref 0.0–0.2)

## 2021-10-30 LAB — PROTIME-INR
INR: 1.1 (ref 0.8–1.2)
Prothrombin Time: 13.9 seconds (ref 11.4–15.2)

## 2021-10-30 LAB — ECHOCARDIOGRAM COMPLETE
AR max vel: 2.51 cm2
AV Area VTI: 2.37 cm2
AV Area mean vel: 2.09 cm2
AV Mean grad: 4 mmHg
AV Peak grad: 5.9 mmHg
Ao pk vel: 1.21 m/s
Area-P 1/2: 3.46 cm2
Height: 63 in
MV VTI: 2.27 cm2
S' Lateral: 2.3 cm
Weight: 2320 oz

## 2021-10-30 LAB — HEPARIN LEVEL (UNFRACTIONATED): Heparin Unfractionated: 0.26 IU/mL — ABNORMAL LOW (ref 0.30–0.70)

## 2021-10-30 LAB — BASIC METABOLIC PANEL
Anion gap: 6 (ref 5–15)
BUN: 17 mg/dL (ref 6–20)
CO2: 27 mmol/L (ref 22–32)
Calcium: 9.1 mg/dL (ref 8.9–10.3)
Chloride: 105 mmol/L (ref 98–111)
Creatinine, Ser: 0.98 mg/dL (ref 0.44–1.00)
GFR, Estimated: >60 mL/min (ref >60)
Glucose, Bld: 105 mg/dL — ABNORMAL HIGH (ref 70–99)
Potassium: 3.6 mmol/L (ref 3.5–5.1)
Sodium: 138 mmol/L (ref 135–145)

## 2021-10-30 LAB — APTT: aPTT: 33 seconds (ref 24–36)

## 2021-10-30 LAB — TROPONIN I (HIGH SENSITIVITY)
Troponin I (High Sensitivity): 184 ng/L (ref <18)
Troponin I (High Sensitivity): 224 ng/L (ref <18)
Troponin I (High Sensitivity): 207 ng/L (ref <18)
Troponin I (High Sensitivity): 187 ng/L (ref <18)

## 2021-10-30 LAB — POC URINE PREG, ED: Preg Test, Ur: NEGATIVE

## 2021-10-30 LAB — HCG, QUANTITATIVE, PREGNANCY: hCG, Beta Chain, Quant, S: 3 m[IU]/mL (ref <5)

## 2021-10-30 MED ORDER — IOHEXOL 350 MG/ML SOLN
75.0000 mL | Freq: Once | INTRAVENOUS | Status: AC | PRN
  Administered 2021-10-30: 75 mL via INTRAVENOUS

## 2021-10-30 MED ORDER — SODIUM CHLORIDE 0.9 % IV SOLN: 250.0000 mL | INTRAVENOUS | Status: DC | PRN

## 2021-10-30 MED ORDER — ONDANSETRON HCL 4 MG/2ML IJ SOLN: 4.0000 mg | Freq: Four times a day (QID) | INTRAMUSCULAR | Status: DC | PRN

## 2021-10-30 MED ORDER — ANASTROZOLE 1 MG PO TABS
1.0000 mg | ORAL_TABLET | Freq: Every day | ORAL | Status: DC
  Filled 2021-10-30: qty 1

## 2021-10-30 MED ORDER — HEPARIN BOLUS VIA INFUSION
1000.0000 [IU] | Freq: Once | INTRAVENOUS | Status: AC
  Administered 2021-10-30: 1000 [IU] via INTRAVENOUS
  Filled 2021-10-30: qty 1000

## 2021-10-30 MED ORDER — SODIUM CHLORIDE 0.9% FLUSH: 3.0000 mL | INTRAVENOUS | Status: DC | PRN

## 2021-10-30 MED ORDER — ASPIRIN EC 81 MG PO TBEC
81.0000 mg | DELAYED_RELEASE_TABLET | Freq: Every day | ORAL | Status: DC
  Filled 2021-10-30: qty 1

## 2021-10-30 MED ORDER — ASPIRIN 81 MG PO CHEW
324.0000 mg | CHEWABLE_TABLET | Freq: Once | ORAL | Status: AC
  Administered 2021-10-30: 324 mg via ORAL
  Filled 2021-10-30: qty 4

## 2021-10-30 MED ORDER — NITROGLYCERIN 0.4 MG SL SUBL: 0.4000 mg | SUBLINGUAL_TABLET | SUBLINGUAL | Status: DC | PRN

## 2021-10-30 MED ORDER — ATORVASTATIN CALCIUM 20 MG PO TABS
40.0000 mg | ORAL_TABLET | Freq: Every day | ORAL | Status: DC
  Administered 2021-10-30: 40 mg via ORAL
  Filled 2021-10-30 (×2): qty 2

## 2021-10-30 MED ORDER — HEPARIN BOLUS VIA INFUSION
4000.0000 [IU] | Freq: Once | INTRAVENOUS | Status: AC
  Administered 2021-10-30: 4000 [IU] via INTRAVENOUS
  Filled 2021-10-30: qty 4000

## 2021-10-30 MED ORDER — METOPROLOL TARTRATE 25 MG PO TABS
12.5000 mg | ORAL_TABLET | Freq: Two times a day (BID) | ORAL | Status: DC
  Administered 2021-10-30 (×2): 12.5 mg via ORAL
  Filled 2021-10-30 (×3): qty 1

## 2021-10-30 MED ORDER — ACETAMINOPHEN 325 MG PO TABS: 650.0000 mg | ORAL_TABLET | ORAL | Status: DC | PRN

## 2021-10-30 MED ORDER — HEPARIN (PORCINE) 25000 UT/250ML-% IV SOLN
850.0000 [IU]/h | INTRAVENOUS | Status: DC
  Administered 2021-10-30: 800 [IU]/h via INTRAVENOUS
  Filled 2021-10-30 (×2): qty 250

## 2021-10-30 MED ORDER — SODIUM CHLORIDE 0.9% FLUSH
3.0000 mL | Freq: Two times a day (BID) | INTRAVENOUS | Status: DC
  Administered 2021-10-30: 3 mL via INTRAVENOUS

## 2021-10-30 NOTE — Consult Note (Signed)
? ? ? ?Cardiology Consultation:  ? ?Patient ID: Kelsey Klein; [id number]; 11/17/1970  ? ?Admit date: 10/30/2021 ?Date of Consult: 10/30/2021 ? ?Primary Care Provider: [facility] [place] ?Primary Cardiologist: New to [facility] - consult by End ?Primary Electrophysiologist:  None ? ? ?Patient Profile:  ? ?Kelsey Klein is a 51 y.o. female with a hx of breast cancer s/p bilateral mastectomy who is being seen today for the evaluation of chest pressure and elevated troponin at the request of Dr. Francine Graven. ? ?History of Present Illness:  ? ?Kelsey Klein has no previously known cardiac history.  She presented to [facility] on 10/30/2021 after initially developing chest pressure on 5/14 with associated shortness of breath, flushing, and sweating.  Symptoms lasted for approximately 10 minutes and improved with laying down.  Throughout the day on 5/15 and she was largely asymptomatic until that evening when she again developed chest pressure that improved with lying down after approximately 10 minutes.  This morning, she was getting ready to go workout at the gym (had recently started working out with [employer] 2 weeks ago), when she developed right arm discomfort without chest pain.  Given symptoms, she presented to [facility] ED for further evaluation.  Patient is a non-smoker, has no known history of diabetes, hypertension, hyperlipidemia.  There is no known family history of premature coronary artery disease.  Patient's mother reports that she has a history of arthritis and "a murmur." ? ?Upon arrival to [facility] ED, vitals have been stable and she has been asymptomatic.  EKG showed sinus rhythm with no acute ST-T changes.  Initial high-sensitivity troponin elevated at 184 with a delta troponin of 224.  Chest x-ray showed no acute cardiopulmonary process.  CTA of the chest showed no evidence of PE with subsegmental left lower lobe atelectasis.  CBC unremarkable.  In the ED, she received ASA 324 mg x 1.  At time of cardiology  consult, patient is asymptomatic. ? ? ? ?Past Medical History:  ?Diagnosis Date  ? Breast cancer, stage 1, estrogen receptor negative, right ([place]) 08/19/2015  ?  18 mm; pT1c  pN0 (0/6nodes positive). ER/PR +; Her 2 neu not overexpressed  ? Family history of breast cancer   ? ? ?Past Surgical History:  ?Procedure Laterality Date  ? BREAST BIOPSY Left 05/27/2017  ? U/S core Bx- invasive mammary ER/PR positive Her2 negative  ? BREAST BIOPSY Left 05/27/2017  ? Affirm Bx- DCIS  ? BREAST RECONSTRUCTION WITH PLACEMENT OF TISSUE EXPANDER AND FLEX HD (ACELLULAR HYDRATED DERMIS) Bilateral 08/18/2017  ? Procedure: BREAST RECONSTRUCTION WITH PLACEMENT OF TISSUE EXPANDER AND FLEX HD (ACELLULAR HYDRATED DERMIS);  Surgeon: Wallace Going, DO;  Location: [facility];  Service: Plastics;  Laterality: Bilateral;  ? LIPOSUCTION WITH LIPOFILLING Bilateral 05/21/2018  ? Procedure: liposuction with fat grafting and revolve;  Surgeon: Wallace Going, DO;  Location: [place];  Service: Plastics;  Laterality: Bilateral;  ? LIPOSUCTION WITH LIPOFILLING Bilateral 02/23/2020  ? Procedure: Fat filling to bilateral breasts for symmetry;  Surgeon: Wallace Going, DO;  Location: [place];  Service: Plastics;  Laterality: Bilateral;  ? MASTECTOMY W/ SENTINEL NODE BIOPSY Bilateral 08/18/2017  ? Procedure: MASTECTOMY WITH SENTINEL LYMPH NODE BIOPSY,FROZEN SECTION;  Surgeon: Robert Bellow, MD;  Location: [facility];  Service: General;  Laterality: Bilateral;  ? REMOVAL OF TISSUE EXPANDER AND PLACEMENT OF IMPLANT Bilateral 12/10/2017  ? Procedure: REMOVAL OF TISSUE EXPANDERS AND PLACEMENT OF IMPLANTS;  Surgeon: Wallace Going, DO;  Location: [place];  Service: Plastics;  Laterality: Bilateral;  ?  ? ?Home Meds: ?Prior to Admission medications   ?Medication Sig Start Date End Date Taking? Authorizing Provider  ?anastrozole (ARIMIDEX) 1 MG tablet Take 1 tablet (1 mg total) by mouth  daily. 01/29/21   Jacquelin Hawking, NP  ?ibuprofen (ADVIL) 600 MG tablet Take 1 tablet (600 mg total) by mouth every 6 (six) hours as needed for mild pain or moderate pain. For use AFTER surgery 02/08/20   Threasa Heads, PA-C  ? ? ?Inpatient Medications: ?Scheduled Meds: ? ?Continuous Infusions: ? ?PRN Meds: ? ? ?Allergies:  No Known Allergies ? ?Social History:   ?Social History  ? ?Socioeconomic History  ? Marital status: Single  ?  Spouse name: Not on file  ? Number of children: Not on file  ? Years of education: Not on file  ? Highest education level: Not on file  ?Occupational History  ? Not on file  ?Tobacco Use  ? Smoking status: Never  ? Smokeless tobacco: Never  ?Vaping Use  ? Vaping Use: Never used  ?Substance and Sexual Activity  ? Alcohol use: Not Currently  ? Drug use: No  ? Sexual activity: Yes  ?  Birth control/protection: Condom  ?Other Topics Concern  ? Not on file  ?Social History Narrative  ? Not on file  ? ?Social Determinants of Health  ? ?Financial Resource Strain: Not on file  ?Food Insecurity: Not on file  ?Transportation Needs: Not on file  ?Physical Activity: Not on file  ?Stress: Not on file  ?Social Connections: Not on file  ?Intimate Partner Violence: Not on file  ?  ? ?Family History:   ?Family History  ?Problem Relation Age of Onset  ? Arthritis Mother   ? Cirrhosis Father   ?     deceased 65; adopted  ? Hypertension Father   ? Hypertension Sister   ? Alzheimer's disease Maternal Grandmother   ? Breast cancer Paternal Aunt   ?     age at dx unknown  ? Breast cancer Paternal Aunt   ?     age at dx unknown  ? ? ?ROS:  ?Review of Systems  ?Constitutional:  Positive for diaphoresis. Negative for chills, fever, malaise/fatigue and weight loss.  ?     Flushing  ?HENT:  Negative for congestion.   ?Eyes:  Negative for discharge and redness.  ?Respiratory:  Positive for shortness of breath. Negative for cough, sputum production and wheezing.   ?Cardiovascular:  Positive for chest pain.  Negative for palpitations, orthopnea, claudication, leg swelling and PND.  ?     Chest pressure ?Right arm pressure  ?Gastrointestinal:  Negative for abdominal pain, heartburn, nausea and vomiting.  ?Musculoskeletal:  Negative for falls and myalgias.  ?Skin:  Negative for rash.  ?Neurological:  Negative for dizziness, tingling, tremors, sensory change, speech change, focal weakness, loss of consciousness and weakness.  ?Endo/Heme/Allergies:  Does not bruise/bleed easily.  ?Psychiatric/Behavioral:  Negative for substance abuse. The patient is not nervous/anxious.   ?All other systems reviewed and are negative.   ? ?Physical Exam/Data:  ? ?Vitals:  ? 10/30/21 0835 10/30/21 0838 10/30/21 1106  ?BP:  (!) 133/99 130/88  ?Pulse:  (!) 57 (!) 58  ?Resp:  15 16  ?Temp:  98.5 ?F (36.9 ?C)   ?TempSrc:  Oral   ?SpO2:  97% 98%  ?Weight: 65.8 kg    ?Height: 5' 3" (1.6 m)    ? ?No  intake or output data in the 24 hours ending 10/30/21 1148 ?Filed Weights  ? 10/30/21 0835  ?Weight: 65.8 kg  ? ?Body mass index is 25.69 kg/m?.  ? ?Physical Exam: ?General: Well developed, well nourished, in no acute distress. ?Head: Normocephalic, atraumatic, sclera non-icteric, no xanthomas, nares without discharge.  ?Neck: Negative for carotid bruits. JVD not elevated. ?Lungs: Clear bilaterally to auscultation without wheezes, rales, or rhonchi. Breathing is unlabored. ?Heart: RRR with S1 S2. No murmurs, rubs, or gallops appreciated. ?Abdomen: Soft, non-tender, non-distended with normoactive bowel sounds. No hepatomegaly. No rebound/guarding. No obvious abdominal masses. ?Msk:  Strength and tone appear normal for age. ?Extremities: No clubbing or cyanosis. No edema. Distal pedal pulses are 2+ and equal bilaterally. ?Neuro: Alert and oriented X 3. No facial asymmetry. No focal deficit. Moves all extremities spontaneously. ?Psych:  Responds to questions appropriately with a normal affect. ? ? ?EKG:  The EKG was personally reviewed and demonstrates: NSR  with sinus arrhythmia, no acute st/t changes ?Telemetry:  Telemetry was personally reviewed and demonstrates: SR ? ?Weights: ?Filed Weights  ? 10/30/21 0835  ?Weight: 65.8 kg  ? ? ?Relevant CV Studies: ? ?None

## 2021-10-30 NOTE — Progress Notes (Signed)
*  PRELIMINARY RESULTS* ?Echocardiogram ?2D Echocardiogram has been performed. ? ?Kelsey Klein, Sonia Side ?10/30/2021, 2:02 PM ?

## 2021-10-30 NOTE — ED Notes (Signed)
MD Pad informed of pt's trop result of 184 ?

## 2021-10-30 NOTE — Consult Note (Signed)
ANTICOAGULATION CONSULT NOTE - Initial Consult ? ?Pharmacy Consult for Heparin ?Indication: chest pain/ACS ? ?No Known Allergies ? ?Patient Measurements: ?Height: '5\' 3"'$  (160 cm) ?Weight: 65.8 kg (145 lb) ?IBW/kg (Calculated) : 52.4 ?Heparin Dosing Weight: 65.6 kg ? ?Vital Signs: ?Temp: 98.5 ?F (36.9 ?C) (05/16 1610) ?Temp Source: Oral (05/16 9604) ?BP: 122/80 (05/16 1500) ?Pulse Rate: 60 (05/16 1515) ? ?Labs: ?Recent Labs  ?  10/30/21 ?5409 10/30/21 ?0955 10/30/21 ?1055 10/30/21 ?1211 10/30/21 ?1420  ?HGB 14.5  --   --   --   --   ?HCT 43.3  --   --   --   --   ?PLT 237  --   --   --   --   ?APTT  --   --  33  --   --   ?LABPROT  --   --  13.9  --   --   ?INR  --   --  1.1  --   --   ?CREATININE 0.98  --   --   --   --   ?TROPONINIHS 184* 224*  --  207* 187*  ? ? ?Estimated Creatinine Clearance: 62.7 mL/min (by C-G formula based on SCr of 0.98 mg/dL). ? ? ?Medical History: ?Past Medical History:  ?Diagnosis Date  ? Breast cancer, stage 1, estrogen receptor negative, right ([place]) 08/19/2015  ?  18 mm; pT1c  pN0 (0/6nodes positive). ER/PR +; Her 2 neu not overexpressed  ? Family history of breast cancer   ? ? ?Medications:  ?No prior history of chronic anticoagulant use PTA. ? ?Assessment: ?Pharmacy has been consulted to initiate and monitor heparin in 51yo presenting to the ED with chest pain. Troponin levels of 184 and 224, respectively. Baseline labs: aPTT 33 sec, INR 1.1, Hgb 14.5, Plts 237 ? ?Goal of Therapy:  ?Heparin level 0.3-0.7 units/ml ?Monitor platelets by anticoagulation protocol: Yes ?  ?Plan:  ?Give 4000 units bolus x 1 ?Start heparin infusion at 800 units/hr ?Check anti-Xa level in 6 hours and daily while on heparin ?Continue to monitor H&H and platelets ? ?Rolin Schult A Zarin Knupp ?10/30/2021,3:34 PM ? ? ?

## 2021-10-30 NOTE — ED Notes (Signed)
See triage note  presents with intermittent chest pressure since Sunday  states when she had this pressure she also had some SOB  this am she developed right arm pain ?

## 2021-10-30 NOTE — ED Notes (Signed)
ECHO at bedside.

## 2021-10-30 NOTE — ED Notes (Signed)
ECHO completed

## 2021-10-30 NOTE — Assessment & Plan Note (Signed)
Patient has a history of breast cancer status post bilateral mastectomy ?Continue anastrozole ?Follow-up with oncology as an outpatient ?

## 2021-10-30 NOTE — ED Provider Notes (Addendum)
? ?[facility] ?Provider Note ? ? ? Event Date/Time  ? First MD Initiated Contact with Patient 10/30/21 [phone]   ?  (approximate) ? ? ?History  ? ?Chest Pain ? ?HPI ? ?Kelsey Klein is a 51 y.o. female who on review of record from oncology from this year has Pathologic stage Ia ER/PR positive, HER-2 negative invasive lower outer quadrant the left breast.  ? ?She has had previous but distant breast surgeries and liposuction.  Denies pregnancy ? ?A 51 year old patient presents for evaluation of chest pain. Initial onset of pain was more than 6 hours ago. The patient's chest pain is described as heaviness/pressure/tightness and is not worse with exertion. The patient's chest pain is middle- or left-sided, is not well-localized, is not sharp and does radiate to the arms/jaw/neck. The patient does not complain of nausea and denies diaphoresis. The patient has no history of stroke, has no history of peripheral artery disease, has not smoked in the past 90 days, denies any history of treated diabetes, has no relevant family history of coronary artery disease (first degree relative at less than age 33), is not hypertensive, has no history of hypercholesterolemia and does not have an elevated BMI (>=30).  ? ?Experienced about 3 episodes of chest pressure associated with a feeling of shortness of breath.  Last occurred yesterday in the evening, no chest pain or pressure today.  Has no personal history of heart disease ? ?Works out on a regular basis ? ?HPI: A 51 year old patient presents for evaluation of chest pain. Initial onset of pain was more than 6 hours ago. The patient's chest pain is described as heaviness/pressure/tightness and is not worse with exertion. The patient's chest pain is middle- or left-sided, is not well-localized, is not sharp and does radiate to the arms/jaw/neck. The patient does not complain of nausea and denies diaphoresis. The patient has no history of stroke, has no history  of peripheral artery disease, has not smoked in the past 90 days, denies any history of treated diabetes, has no relevant family history of coronary artery disease (first degree relative at less than age 5), is not hypertensive, has no history of hypercholesterolemia and does not have an elevated BMI (>=30).  ? ? ?Physical Exam  ? ?Triage Vital Signs: ?ED Triage Vitals  ?Enc Vitals Group  ?   BP 10/30/21 0838 (!) 133/99  ?   Pulse Rate 10/30/21 0838 (!) 57  ?   Resp 10/30/21 0838 15  ?   Temp 10/30/21 0838 98.5 ?F (36.9 ?C)  ?   Temp Source 10/30/21 0838 Oral  ?   SpO2 10/30/21 0838 97 %  ?   Weight 10/30/21 0835 145 lb (65.8 kg)  ?   Height 10/30/21 0835 5' 3" (1.6 m)  ?   Head Circumference --   ?   Peak Flow --   ?   Pain Score 10/30/21 0835 9  ?   Pain Loc --   ?   Pain Edu? --   ?   Excl. in [place]? --   ? ? ?Most recent vital signs: ?Vitals:  ? 10/30/21 0838 10/30/21 1106  ?BP: (!) 133/99 130/88  ?Pulse: (!) 57 (!) 58  ?Resp: 15 16  ?Temp: 98.5 ?F (36.9 ?C)   ?SpO2: 97% 98%  ? ? ? ?General: Awake, no distress.  ?CV:  Good peripheral perfusion.  Normal heart tones. ?Resp:  Normal effort.  Clear bilaterally ?Abd:  No distention.  ?Other:  No  calf tenderness or notable leg edema. ? ? ?ED Results / Procedures / Treatments  ? ?Labs ?(all labs ordered are listed, but only abnormal results are displayed) ?Labs Reviewed  ?BASIC METABOLIC PANEL - Abnormal; Notable for the following components:  ?    Result Value  ? Glucose, Bld 105 (*)   ? All other components within normal limits  ?TROPONIN I (HIGH SENSITIVITY) - Abnormal; Notable for the following components:  ? Troponin I (High Sensitivity) 184 (*)   ? All other components within normal limits  ?TROPONIN I (HIGH SENSITIVITY) - Abnormal; Notable for the following components:  ? Troponin I (High Sensitivity) 224 (*)   ? All other components within normal limits  ?CBC  ?HCG, QUANTITATIVE, PREGNANCY  ?PROTIME-INR  ?APTT  ?POC URINE PREG, ED  ?TROPONIN I (HIGH SENSITIVITY)   ? ? ? ?EKG ? ?ED ECG REPORT ?Monika Salk, the attending physician, personally viewed and interpreted this ECG. ? ?Date: 10/30/2021 ?EKG Time: 840 ?Rate: 70 ?Rhythm: normal sinus rhythm ?QRS Axis: normal ?Intervals: normal ?ST/T Wave abnormalities: normal ?Narrative Interpretation: no evidence of acute ischemia ? ? ? ?RADIOLOGY ? ? ? ?CT pulmonary embolism study ordered to exclude PE.  Personally viewed and interpreted the patient's CT of the chest is negative for gross abnormality or large pulmonary embolism.  Defer to radiologist for detailed read ? ?DG Chest 2 View ? ?Result Date: 10/30/2021 ?CLINICAL DATA:  RIGHT arm pain EXAM: CHEST - 2 VIEW COMPARISON:  None Available. FINDINGS: Normal mediastinum and cardiac silhouette. Normal pulmonary vasculature. No evidence of effusion, infiltrate, or pneumothorax. No acute bony abnormality. IMPRESSION: No acute cardiopulmonary process. Electronically Signed   By: Suzy Bouchard M.D.   On: 10/30/2021 09:09  ? ?CT Angio Chest PE W and/or Wo Contrast ? ?Result Date: 10/30/2021 ?CLINICAL DATA:  Pulmonary embolism (PE) suspected, high prob EXAM: CT ANGIOGRAPHY CHEST WITH CONTRAST TECHNIQUE: Multidetector CT imaging of the chest was performed using the standard protocol during bolus administration of intravenous contrast. Multiplanar CT image reconstructions and MIPs were obtained to evaluate the vascular anatomy. RADIATION DOSE REDUCTION: This exam was performed according to the departmental dose-optimization program which includes automated exposure control, adjustment of the mA and/or kV according to patient size and/or use of iterative reconstruction technique. CONTRAST:  52m OMNIPAQUE IOHEXOL 350 MG/ML SOLN COMPARISON:  Chest radiograph from the same day. FINDINGS: Cardiovascular: Satisfactory opacification of the pulmonary arteries to the segmental level. No evidence of pulmonary embolism. Normal heart size. No pericardial effusion. Mediastinum/Nodes: No enlarged  mediastinal, hilar, or axillary lymph nodes. Thyroid gland, trachea, and esophagus demonstrate no significant findings. Lungs/Pleura: Subsegmental left basilar atelectasis. No consolidation. No pleural effusions or pneumothorax Upper Abdomen: No acute abnormality. Musculoskeletal: No acute fracture.  Bilateral breast implants. Review of the MIP images confirms the above findings. IMPRESSION: 1. No evidence of acute pulmonary embolism. 2. Subsegmental left lower lobe atelectasis. Electronically Signed   By: FMargaretha SheffieldM.D.   On: 10/30/2021 10:27   ? ? ? ?PROCEDURES: ? ?Critical Care performed: Yes ? ?CRITICAL CARE ?Performed by: MDelman Kitten? ? ?Total critical care time: no ? ?Procedures ? ? ?MEDICATIONS ORDERED IN ED: ?Medications  ?aspirin chewable tablet 324 mg (324 mg Oral Given 10/30/21 0938)  ?iohexol (OMNIPAQUE) 350 MG/ML injection 75 mL (75 mLs Intravenous Contrast Given 10/30/21 1005)  ? ? ? ?IMPRESSION / MDM / ASSESSMENT AND PLAN / ED COURSE  ?I reviewed the triage vital signs and the nursing notes. ?HEAR Score: 2  HEAR Score: 2  ?               ? ?Differential diagnosis includes, but is not limited to, ACS, aortic dissection, pulmonary embolism, cardiac tamponade, pneumothorax, pneumonia, pericarditis, myocarditis, GI-related causes including esophagitis/gastritis, and musculoskeletal chest wall pain.   ? ?Patient's EKG is quite reassuring without evidence of ACS, however her first troponin is notably elevated at 184.  She has not had any pain or symptoms since yesterday evening, will provide aspirin and continue to monitor closely.  She is on telemetry monitor, I have ordered a CT scan to evaluate and exclude pulmonary embolism and also evaluate for potential other thoracic sources of chest pain though high on my differential at this point would be unstable angina versus pulmonary embolism. ? ?She does not have any clearly provocative factors for coronary artery disease, but does have a known  history of breast cancer ? ?The patient is on the cardiac monitor to evaluate for evidence of arrhythmia and/or significant heart rate changes. ? ?Clinical Course as of 10/30/21 1212  ?Tue Oct 30, 2021  ?

## 2021-10-30 NOTE — ED Triage Notes (Signed)
Pt here with CP that started Sat. Pt states pain is intermittent but had several episodes throughout the weekend. Pt states her right arm has pain as well. Pt denies N/V/D.  ?

## 2021-10-30 NOTE — Consult Note (Signed)
ANTICOAGULATION CONSULT NOTE - Initial Consult ? ?Pharmacy Consult for Heparin ?Indication: chest pain/ACS ? ?No Known Allergies ? ?Patient Measurements: ?Height: '5\' 3"'$  (160 cm) ?Weight: 65.8 kg (145 lb) ?IBW/kg (Calculated) : 52.4 ?Heparin Dosing Weight: 65.6 kg ? ?Vital Signs: ?Temp: 98.2 ?F (36.8 ?C) (05/16 1954) ?Temp Source: Oral (05/16 1954) ?BP: 119/84 (05/16 1954) ?Pulse Rate: 69 (05/16 1954) ? ?Labs: ?Recent Labs  ?  10/30/21 ?5176 10/30/21 ?0955 10/30/21 ?1055 10/30/21 ?1211 10/30/21 ?1420 10/30/21 ?1914  ?HGB 14.5  --   --   --   --   --   ?HCT 43.3  --   --   --   --   --   ?PLT 237  --   --   --   --   --   ?APTT  --   --  33  --   --   --   ?LABPROT  --   --  13.9  --   --   --   ?INR  --   --  1.1  --   --   --   ?HEPARINUNFRC  --   --   --   --   --  0.26*  ?CREATININE 0.98  --   --   --   --   --   ?TROPONINIHS 184* 224*  --  207* 187*  --   ? ? ? ?Estimated Creatinine Clearance: 62.7 mL/min (by C-G formula based on SCr of 0.98 mg/dL). ? ? ?Medical History: ?Past Medical History:  ?Diagnosis Date  ? Breast cancer, stage 1, estrogen receptor negative, right ([place]) 08/19/2015  ?  18 mm; pT1c  pN0 (0/6nodes positive). ER/PR +; Her 2 neu not overexpressed  ? Family history of breast cancer   ? ? ?Medications: NKDA; Heparin Dosing Weight: 65.6 kg ?No prior history of chronic anticoagulant use PTA. ? ?Assessment: ?Pharmacy has been consulted to initiate and monitor heparin in 50yo presenting to the ED with chest pain. Troponin levels of 184 and 224, respectively. Baseline labs: aPTT 33 sec, INR 1.1, Hgb 14.5, Plts 237 ? ?Date Time  HL Rate/Comment ?0516 1914 0.26 Slightly subtherapeutic; 800 un/hr     ? ?Goal of Therapy:  ?Heparin level 0.3-0.7 units/ml ?Monitor platelets by anticoagulation protocol: Yes ?  ?Plan:  ?HL slightly subtherapeutic at 0.26 (goal 0.3-0.7) ?Give 1000 units bolus x1; then increase heparin infusion to 950 units/hr ?Check anti-Xa level in 6 hours and daily while on heparin ?Continue  to monitor H&H and platelets ? ?Kelsey Klein ?10/30/2021,9:08 PM ? ? ?

## 2021-10-30 NOTE — H&P (Addendum)
?History and Physical  ? ? ?Patient: Kelsey Klein PPJ:[id number] DOB: 1970-09-13 ?DOA: 10/30/2021 ?DOS: the patient was seen and examined on 10/30/2021 ?PCP: [facility] [place]  ?Patient coming from: Home ? ?Chief Complaint:  ?Chief Complaint  ?Patient presents with  ? Chest Pain  ? ?HPI: Kelsey Klein is a 51 y.o. female with medical history significant for breast cancer status post bilateral mastectomy with reconstruction presents to the ER for evaluation of chest pain that started 2 days prior to her presentation.  Pain is intermittent and not related to rest or exertion.  She has had several episodes throughout the weekend and her last episode was the day prior to admission.  She rates her pain a 5 x 10 in intensity at its worst with radiation to her right arm.  Chest pain is associated with shortness of breath and diaphoresis but she denies having any nausea, no vomiting or palpitations. ?She denies having any fever, no chills, no cough, no headache, no urinary symptoms, no changes in her bowel habits, no blurred vision or focal deficit. ?At the time of this history and physical she complains of pain in her right denies having any chest pain. ?Review of Systems: As mentioned in the history of present illness. All other systems reviewed and are negative. ?Past Medical History:  ?Diagnosis Date  ? Breast cancer, stage 1, estrogen receptor negative, right ([place]) 08/19/2015  ?  18 mm; pT1c  pN0 (0/6nodes positive). ER/PR +; Her 2 neu not overexpressed  ? Family history of breast cancer   ? ?Past Surgical History:  ?Procedure Laterality Date  ? BREAST BIOPSY Left 05/27/2017  ? U/S core Bx- invasive mammary ER/PR positive Her2 negative  ? BREAST BIOPSY Left 05/27/2017  ? Affirm Bx- DCIS  ? BREAST RECONSTRUCTION WITH PLACEMENT OF TISSUE EXPANDER AND FLEX HD (ACELLULAR HYDRATED DERMIS) Bilateral 08/18/2017  ? Procedure: BREAST RECONSTRUCTION WITH PLACEMENT OF TISSUE EXPANDER AND FLEX HD (ACELLULAR  HYDRATED DERMIS);  Surgeon: Wallace Going, DO;  Location: [facility];  Service: Plastics;  Laterality: Bilateral;  ? LIPOSUCTION WITH LIPOFILLING Bilateral 05/21/2018  ? Procedure: liposuction with fat grafting and revolve;  Surgeon: Wallace Going, DO;  Location: [place];  Service: Plastics;  Laterality: Bilateral;  ? LIPOSUCTION WITH LIPOFILLING Bilateral 02/23/2020  ? Procedure: Fat filling to bilateral breasts for symmetry;  Surgeon: Wallace Going, DO;  Location: [place];  Service: Plastics;  Laterality: Bilateral;  ? MASTECTOMY W/ SENTINEL NODE BIOPSY Bilateral 08/18/2017  ? Procedure: MASTECTOMY WITH SENTINEL LYMPH NODE BIOPSY,FROZEN SECTION;  Surgeon: Robert Bellow, MD;  Location: [facility];  Service: General;  Laterality: Bilateral;  ? REMOVAL OF TISSUE EXPANDER AND PLACEMENT OF IMPLANT Bilateral 12/10/2017  ? Procedure: REMOVAL OF TISSUE EXPANDERS AND PLACEMENT OF IMPLANTS;  Surgeon: Wallace Going, DO;  Location: [place];  Service: Plastics;  Laterality: Bilateral;  ? ?Social History:  reports that she has never smoked. She has never used smokeless tobacco. She reports that she does not currently use alcohol. She reports that she does not use drugs. ? ?No Known Allergies ? ?Family History  ?Problem Relation Age of Onset  ? Arthritis Mother   ? Cirrhosis Father   ?     deceased 28; adopted  ? Hypertension Father   ? Hypertension Sister   ? Alzheimer's disease Maternal Grandmother   ? Breast cancer Paternal Aunt   ?     age at dx unknown  ?  Breast cancer Paternal Aunt   ?     age at dx unknown  ? ? ?Prior to Admission medications   ?Medication Sig Start Date End Date Taking? Authorizing Provider  ?anastrozole (ARIMIDEX) 1 MG tablet Take 1 tablet (1 mg total) by mouth daily. 01/29/21   Jacquelin Hawking, NP  ?ibuprofen (ADVIL) 600 MG tablet Take 1 tablet (600 mg total) by mouth every 6 (six) hours as needed for mild pain or moderate  pain. For use AFTER surgery 02/08/20   Threasa Heads, PA-C  ? ? ?Physical Exam: ?Vitals:  ? 10/30/21 0835 10/30/21 0838 10/30/21 1106  ?BP:  (!) 133/99 130/88  ?Pulse:  (!) 57 (!) 58  ?Resp:  15 16  ?Temp:  98.5 ?F (36.9 ?C)   ?TempSrc:  Oral   ?SpO2:  97% 98%  ?Weight: 65.8 kg    ?Height: 5' 3"  (1.6 m)    ? ?Physical Exam ?Vitals and nursing note reviewed.  ?Constitutional:   ?   Appearance: She is well-developed.  ?HENT:  ?   Head: Normocephalic and atraumatic.  ?Eyes:  ?   Pupils: Pupils are equal, round, and reactive to light.  ?Cardiovascular:  ?   Rate and Rhythm: Bradycardia present.  ?   Heart sounds: Normal heart sounds.  ?Pulmonary:  ?   Effort: Pulmonary effort is normal.  ?   Breath sounds: Normal breath sounds.  ?Abdominal:  ?   General: Bowel sounds are normal.  ?   Palpations: Abdomen is soft.  ?Musculoskeletal:     ?   General: Normal range of motion.  ?   Cervical back: Normal range of motion.  ?Skin: ?   General: Skin is warm and dry.  ?Neurological:  ?   Mental Status: She is alert.  ?Psychiatric:     ?   Mood and Affect: Mood normal.     ?   Behavior: Behavior normal.  ? ? ?Data Reviewed: ?Relevant notes from primary care and specialist visits, past discharge summaries as available in EHR, including Care Everywhere. ?Prior diagnostic testing as pertinent to current admission diagnoses ?Updated medications and problem lists for reconciliation ?ED course, including vitals, labs, imaging, treatment and response to treatment ?Triage notes, nursing and pharmacy notes and ED provider's notes ?Notable results as noted in HPI ?Labs reviewed.  White count 4.4, hemoglobin 14.5, hematocrit 43.3, RDW 12.9, platelet count 237, sodium 138, potassium 3.6, chloride 105, bicarb 27, glucose 105, BUN 17, creatinine 0.98, troponin 184 >> 224 ?Chest x-ray reviewed by me shows no evidence of acute cardiopulmonary disease ?CT angiogram of the chest shows no evidence of acute pulmonary embolism. Subsegmental left  lower lobe atelectasis. ?Twelve-lead EKG reviewed by me shows normal sinus rhythm with sinus arrhythmia. ?There are no new results to review at this time. ? ?Assessment and Plan: ?* NSTEMI (non-ST elevated myocardial infarction) ([place]) ?Patient presents for evaluation of chest pain associated with shortness of breath and diaphoresis with radiation to her right hand. ?She has an uptrending troponin level ?Cycle troponin levels ?Start patient on heparin drip ?Place patient on aspirin, statin and low-dose beta-blockers ?Obtain 2D echocardiogram to assess LVEF and general evaluation abnormality ?Consult cardiology ? ?Malignant neoplasm of overlapping sites of left female breast [facility]) ?Patient has a history of breast cancer status post bilateral mastectomy ?Continue anastrozole ?Follow-up with oncology as an outpatient ? ? ? ? ? Advance Care Planning:   Code Status: Full Code  ? ?Consults: Cardiology ? ?Family Communication: Greater than  50% of time was spent discussing patient's condition and plan of care with her at the bedside.  All questions and concerns have been addressed.  She verbalizes understanding and agrees with the plan. ? ?Severity of Illness: ?The appropriate patient status for this patient is INPATIENT. Inpatient status is judged to be reasonable and necessary in order to provide the required intensity of service to ensure the patient's safety. The patient's presenting symptoms, physical exam findings, and initial radiographic and laboratory data in the context of their chronic comorbidities is felt to place them at high risk for further clinical deterioration. Furthermore, it is not anticipated that the patient will be medically stable for discharge from the hospital within 2 midnights of admission.  ? ?* I certify that at the point of admission it is my clinical judgment that the patient will require inpatient hospital care spanning beyond 2 midnights from the point of admission due to high intensity of  service, high risk for further deterioration and high frequency of surveillance required.* ? ?Author: ?Collier Bullock, MD ?10/30/2021 12:37 PM ? ?For on call review [email or web address].  ?

## 2021-10-30 NOTE — ED Notes (Signed)
Pt. Refused second IV insertion. ?

## 2021-10-30 NOTE — Assessment & Plan Note (Addendum)
Patient presents for evaluation of chest pain associated with shortness of breath and diaphoresis with radiation to her right hand. ?She has an uptrending troponin level ?Cycle troponin levels ?Start patient on heparin drip ?Place patient on aspirin, statin and low-dose beta-blockers ?Obtain 2D echocardiogram to assess LVEF and general evaluation abnormality ?Consult cardiology ?

## 2021-10-30 NOTE — Progress Notes (Signed)
? ? ?  Checked in on the patient this afternoon.  She remains asymptomatic.  High sensitivity troponin has peaked at 224 and is down trending.  Echo demonstrated preserved LVSF with an EF of 65-70%, normal wall motion, mild LVH, normal LV diastolic function parameters, normal RV systolic function, ventricular cavity size, and PASP, trivial mitral regurgitation, and an estimated right atrial pressure of 8 mmHg.  ? ?We discussed the risk and benefits of invasive and noninvasive ischemic evaluation options.  She would like to proceed with a Lexiscan MPI.  She will be NPO at midnight with no caffeine after 20:00.  Plan for Lexiscan MPI on 10/31/2021.   ?

## 2021-10-30 NOTE — Plan of Care (Signed)
?  Problem: Education: Goal: Knowledge of General Education information will improve Description: Including pain rating scale, medication(s)/side effects and non-pharmacologic comfort measures Outcome: Progressing   Problem: Health Behavior/Discharge Planning: Goal: Ability to manage health-related needs will improve Outcome: Progressing   Problem: Clinical Measurements: Goal: Ability to maintain clinical measurements within normal limits will improve Outcome: Progressing Goal: Will remain free from infection Outcome: Progressing Goal: Diagnostic test results will improve Outcome: Progressing Goal: Respiratory complications will improve Outcome: Progressing Goal: Cardiovascular complication will be avoided Outcome: Progressing   Problem: Elimination: Goal: Will not experience complications related to bowel motility Outcome: Progressing Goal: Will not experience complications related to urinary retention Outcome: Progressing   Problem: Coping: Goal: Level of anxiety will decrease Outcome: Progressing   

## 2021-10-31 ENCOUNTER — Inpatient Hospital Stay (HOSPITAL_COMMUNITY): Payer: Medicaid Other

## 2021-10-31 DIAGNOSIS — R079 Chest pain, unspecified: Secondary | ICD-10-CM | POA: Diagnosis not present

## 2021-10-31 DIAGNOSIS — I214 Non-ST elevation (NSTEMI) myocardial infarction: Secondary | ICD-10-CM | POA: Diagnosis not present

## 2021-10-31 LAB — NM MYOCAR MULTI W/SPECT W/WALL MOTION / EF
LV dias vol: 46 mL (ref 46–106)
LV sys vol: 16 mL
Nuc Stress EF: 65 %
Peak HR: 123 {beats}/min
Percent HR: 72 %
Rest HR: 62 {beats}/min
Rest Nuclear Isotope Dose: 9.6 mCi
SDS: 1
SRS: 0
SSS: 1
ST Depression (mm): 0 mm
Stress Nuclear Isotope Dose: 30.8 mCi
TID: 1.09

## 2021-10-31 LAB — LIPID PANEL
Cholesterol: 158 mg/dL (ref 0–200)
HDL: 82 mg/dL
LDL Cholesterol: 68 mg/dL (ref 0–99)
Total CHOL/HDL Ratio: 1.9 ratio
Triglycerides: 38 mg/dL
VLDL: 8 mg/dL (ref 0–40)

## 2021-10-31 LAB — CBC
HCT: 43.6 % (ref 36.0–46.0)
Hemoglobin: 14.2 g/dL (ref 12.0–15.0)
MCH: 29.1 pg (ref 26.0–34.0)
MCHC: 32.6 g/dL (ref 30.0–36.0)
MCV: 89.3 fL (ref 80.0–100.0)
Platelets: 226 10*3/uL (ref 150–400)
RBC: 4.88 MIL/uL (ref 3.87–5.11)
RDW: 13.1 % (ref 11.5–15.5)
WBC: 5.6 10*3/uL (ref 4.0–10.5)
nRBC: 0 % (ref 0.0–0.2)

## 2021-10-31 LAB — BASIC METABOLIC PANEL
Anion gap: 6 (ref 5–15)
BUN: 21 mg/dL — ABNORMAL HIGH (ref 6–20)
CO2: 29 mmol/L (ref 22–32)
Calcium: 9.2 mg/dL (ref 8.9–10.3)
Chloride: 106 mmol/L (ref 98–111)
Creatinine, Ser: 1.04 mg/dL — ABNORMAL HIGH (ref 0.44–1.00)
GFR, Estimated: >60 mL/min (ref >60)
Glucose, Bld: 113 mg/dL — ABNORMAL HIGH (ref 70–99)
Potassium: 4.3 mmol/L (ref 3.5–5.1)
Sodium: 141 mmol/L (ref 135–145)

## 2021-10-31 LAB — HEPARIN LEVEL (UNFRACTIONATED)
Heparin Unfractionated: 0.85 IU/mL — ABNORMAL HIGH (ref 0.30–0.70)
Heparin Unfractionated: 0.58 IU/mL (ref 0.30–0.70)

## 2021-10-31 LAB — HEMOGLOBIN A1C
Hgb A1c MFr Bld: 5.8 % — ABNORMAL HIGH (ref 4.8–5.6)
Mean Plasma Glucose: 119.76 mg/dL

## 2021-10-31 LAB — HIV ANTIBODY (ROUTINE TESTING W REFLEX): HIV Screen 4th Generation wRfx: NONREACTIVE

## 2021-10-31 MED ORDER — TECHNETIUM TC 99M TETROFOSMIN IV KIT
10.0000 | PACK | Freq: Once | INTRAVENOUS | Status: AC | PRN
  Administered 2021-10-31: 9.63 via INTRAVENOUS

## 2021-10-31 MED ORDER — TECHNETIUM TC 99M TETROFOSMIN IV KIT
30.8400 | PACK | Freq: Once | INTRAVENOUS | Status: AC | PRN
  Administered 2021-10-31: 30.84 via INTRAVENOUS

## 2021-10-31 MED ORDER — REGADENOSON 0.4 MG/5ML IV SOLN
0.4000 mg | Freq: Once | INTRAVENOUS | Status: AC
Start: 2021-10-31 — End: 2021-10-31
  Administered 2021-10-31: 0.4 mg via INTRAVENOUS

## 2021-10-31 NOTE — Consult Note (Signed)
ANTICOAGULATION CONSULT NOTE ? ?Pharmacy Consult for Heparin ?Indication: chest pain/ACS ? ?No Known Allergies ? ?Patient Measurements: ?Height: '5\' 3"'$  (160 cm) ?Weight: 65.8 kg (145 lb) ?IBW/kg (Calculated) : 52.4 ?Heparin Dosing Weight: 65.6 kg ? ?Vital Signs: ?Temp: 97.7 ?F (36.5 ?C) (05/16 2355) ?Temp Source: Oral (05/16 2355) ?BP: 124/74 (05/16 2355) ?Pulse Rate: 59 (05/16 2355) ? ?Labs: ?Recent Labs  ?  10/30/21 ?8916 10/30/21 ?0955 10/30/21 ?1055 10/30/21 ?1211 10/30/21 ?1420 10/30/21 ?1914 10/31/21 ?0313  ?HGB 14.5  --   --   --   --   --  14.2  ?HCT 43.3  --   --   --   --   --  43.6  ?PLT 237  --   --   --   --   --  226  ?APTT  --   --  33  --   --   --   --   ?LABPROT  --   --  13.9  --   --   --   --   ?INR  --   --  1.1  --   --   --   --   ?HEPARINUNFRC  --   --   --   --   --  0.26* 0.85*  ?CREATININE 0.98  --   --   --   --   --  1.04*  ?TROPONINIHS 184* 224*  --  207* 187*  --   --   ? ? ? ?Estimated Creatinine Clearance: 59.1 mL/min (A) (by C-G formula based on SCr of 1.04 mg/dL (H)). ? ? ?Medical History: ?Past Medical History:  ?Diagnosis Date  ? Breast cancer, stage 1, estrogen receptor negative, right ([place]) 08/19/2015  ?  18 mm; pT1c  pN0 (0/6nodes positive). ER/PR +; Her 2 neu not overexpressed  ? Family history of breast cancer   ? ? ?Medications: NKDA; Heparin Dosing Weight: 65.6 kg ?No prior history of chronic anticoagulant use PTA. ? ?Assessment: ?Pharmacy has been consulted to initiate and monitor heparin in 50yo presenting to the ED with chest pain. Troponin levels of 184 and 224, respectively. Baseline labs: aPTT 33 sec, INR 1.1, Hgb 14.5, Plts 237 ? ?Date Time  HL Rate/Comment ?0516 1914 0.26 Slightly subtherapeutic; 800 un/hr ?5/17  0313 0.85 Supratherapeutic    ? ?Goal of Therapy:  ?Heparin level 0.3-0.7 units/ml ?Monitor platelets by anticoagulation protocol: Yes ?  ?Plan:  ?Decrease heparin infusion to 850 units/hr ?Recheck HL in 6 hours after rate change ?Continue to monitor H&H  and platelets ? ?Renda Rolls, PharmD, MBA ?10/31/2021 ?4:08 AM ? ? ? ?

## 2021-10-31 NOTE — Discharge Summary (Signed)
Physician Discharge Summary  ?Kelsey Klein:[id number] DOB: 30-Mar-1971 DOA: 10/30/2021 ? ?[facility]P: [facility] [place] ? ?Admit date: 10/30/2021 ?Discharge date: 10/31/2021 ? ?Admitted From: Home ?Disposition:  Home ? ?Recommendations for Outpatient Follow-up:  ?Follow up with PCP in 1-2 weeks ? ? ?Home Health:No ?Equipment/Devices:None ? ?Discharge Condition:Stable  ?CODE STATUS:FULL  ?Diet recommendation: Regular ? ?Brief/Interim Summary: ?51 y.o. female with medical history significant for breast cancer status post bilateral mastectomy with reconstruction presents to the ER for evaluation of chest pain that started 2 days prior to her presentation.  Pain is intermittent and not related to rest or exertion.  She has had several episodes throughout the weekend and her last episode was the day prior to admission.  She rates her pain a 5 x 10 in intensity at its worst with radiation to her right arm.  Chest pain is associated with shortness of breath and diaphoresis but she denies having any nausea, no vomiting or palpitations. ? ?Patient had troponin elevation up to 224.  Chest pressure had resolved on day of discharge.  Patient underwent Lexiscan Myoview which was normal.  Heparin GTT discontinued and patient cleared for discharge from cardiology standpoint.  Lipid panel reassuring.  No antiplatelet or statin indicated at time of DC.  Outpatient follow-up with PCP.  Recommend follow-up with [facility] cardiology within 2 to 3 weeks.  Can follow-up with Dr. Saunders Revel or [facility] clinic ? ? ? ?Discharge Diagnoses:  ?Principal Problem: ?  NSTEMI (non-ST elevated myocardial infarction) ([place]) ?Active Problems: ?  Malignant neoplasm of overlapping sites of left female breast ([place]) ? ?Atypical chest pain ?NSTEMI, ruled out ?Patient presented with chest pressure with associated shortness of breath and diaphoresis with radiation down the right arm.  Troponin peaked at 224.  Exact etiology of troponin elevation is  unclear.  Echocardiogram reassuring.  Lexiscan Myoview performed on 5/17.  Test was normal.  No indication for antiplatelet agents or LHC at this time.  Patient can follow-up with [facility] cardiology post discharge for consideration of invasive diagnostic testing with recurrence of chest pain. ? ? ? ?Discharge Instructions ? ?Discharge Instructions   ? ? Diet - low sodium heart healthy   Complete by: As directed ?  ? Increase activity slowly   Complete by: As directed ?  ? ?  ? ?Allergies as of 10/31/2021   ?No Known Allergies ?  ? ?  ?Medication List  ?  ? ?STOP taking these medications   ? ?ibuprofen 600 MG tablet ?Commonly known as: ADVIL ?  ? ?  ? ?TAKE these medications   ? ?anastrozole 1 MG tablet ?Commonly known as: ARIMIDEX ?Take 1 tablet (1 mg total) by mouth daily. ?  ? ?  ? ? Follow-up Information   ? ? [facility] [place]. Schedule an appointment as soon as possible for a visit in 1 week(s).   ?Specialty: General Practice ?Contact information: ?[address] ?[place] [address] ?[phone] ? ? ?  ?  ? ?  ?  ? ?  ? ?No Known Allergies ? ?Consultations: ?Cardiology-[facility] ? ? ?Procedures/Studies: ?DG Chest 2 View ? ?Result Date: 10/30/2021 ?CLINICAL DATA:  RIGHT arm pain EXAM: CHEST - 2 VIEW COMPARISON:  None Available. FINDINGS: Normal mediastinum and cardiac silhouette. Normal pulmonary vasculature. No evidence of effusion, infiltrate, or pneumothorax. No acute bony abnormality. IMPRESSION: No acute cardiopulmonary process. Electronically Signed   By: Suzy Bouchard M.D.   On: 10/30/2021 09:09  ? ?DG  Cervical Spine Complete ? ?Result Date: 10/30/2021 ?CLINICAL DATA:  Pain and numbness, right hand. Progressively worsening. EXAM: CERVICAL SPINE - COMPLETE 4+ VIEW COMPARISON:  None Available. FINDINGS: Seven cervical segments are well visualized. Vertebral body height is well maintained. Osteophytic changes are noted at C5-6. Mild neural foraminal narrowing is noted at C5-6  bilaterally. The odontoid is within normal limits. No soft tissue abnormality is seen. IMPRESSION: Mild degenerative changes with neural foraminal narrowing at C5-6. Electronically Signed   By: Inez Catalina M.D.   On: 10/30/2021 20:49  ? ?CT Angio Chest PE W and/or Wo Contrast ? ?Result Date: 10/30/2021 ?CLINICAL DATA:  Pulmonary embolism (PE) suspected, high prob EXAM: CT ANGIOGRAPHY CHEST WITH CONTRAST TECHNIQUE: Multidetector CT imaging of the chest was performed using the standard protocol during bolus administration of intravenous contrast. Multiplanar CT image reconstructions and MIPs were obtained to evaluate the vascular anatomy. RADIATION DOSE REDUCTION: This exam was performed according to the departmental dose-optimization program which includes automated exposure control, adjustment of the mA and/or kV according to patient size and/or use of iterative reconstruction technique. CONTRAST:  59m OMNIPAQUE IOHEXOL 350 MG/ML SOLN COMPARISON:  Chest radiograph from the same day. FINDINGS: Cardiovascular: Satisfactory opacification of the pulmonary arteries to the segmental level. No evidence of pulmonary embolism. Normal heart size. No pericardial effusion. Mediastinum/Nodes: No enlarged mediastinal, hilar, or axillary lymph nodes. Thyroid gland, trachea, and esophagus demonstrate no significant findings. Lungs/Pleura: Subsegmental left basilar atelectasis. No consolidation. No pleural effusions or pneumothorax Upper Abdomen: No acute abnormality. Musculoskeletal: No acute fracture.  Bilateral breast implants. Review of the MIP images confirms the above findings. IMPRESSION: 1. No evidence of acute pulmonary embolism. 2. Subsegmental left lower lobe atelectasis. Electronically Signed   By: FMargaretha SheffieldM.D.   On: 10/30/2021 10:27  ? ?NM Myocar Multi W/Spect W/Wall Motion / EF ? ?Result Date: 10/31/2021 ?  The study is normal.. The study is low risk.   No ST deviation was noted.   LV perfusion is normal.    Left ventricular function is normal. calculated LVEF is 73%.   No significant coronary artery calcifications noted.  ? ?ECHOCARDIOGRAM COMPLETE ? ?Result Date: 10/30/2021 ?   ECHOCARDIOGRAM REPORT   Patient Name:   ADEMISHA NOKESDate of Exam: 10/30/2021 Medical Rec #:  [id number]       Height:       63.0 in Accession #:    [id number]      Weight:       145.0 lb Date of Birth:  106-06-1970       BSA:          1.687 m? Patient Age:    89years         BP:           123/83 mmHg Patient Gender: F                HR:           69 bpm. Exam Location:  [facility] Procedure: 2D Echo, Cardiac Doppler and Color Doppler Indications:     Chest pain R07.9  History:         Patient has no prior history of Echocardiogram examinations.                  NSTEMI.  Sonographer:     JSherrie SportReferring Phys:  [id number] END Diagnosing Phys: CNelva BushMD IMPRESSIONS  1. Left ventricular ejection fraction, by estimation,  is 65 to 70%. The left ventricle has normal function. The left ventricle has no regional wall motion abnormalities. There is mild left ventricular hypertrophy. Left ventricular diastolic parameters were normal.  2. Right ventricular systolic function is normal. The right ventricular size is normal. There is normal pulmonary artery systolic pressure.  3. The mitral valve is normal in structure. Trivial mitral valve regurgitation. No evidence of mitral stenosis.  4. The aortic valve is tricuspid. Aortic valve regurgitation is not visualized. No aortic stenosis is present.  5. The inferior vena cava is normal in size with <50% respiratory variability, suggesting right atrial pressure of 8 mmHg. FINDINGS  Left Ventricle: Left ventricular ejection fraction, by estimation, is 65 to 70%. The left ventricle has normal function. The left ventricle has no regional wall motion abnormalities. The left ventricular internal cavity size was normal in size. There is  mild left ventricular hypertrophy. Left ventricular diastolic  parameters were normal. Right Ventricle: The right ventricular size is normal. No increase in right ventricular wall thickness. Right ventricular systolic function is normal. There is normal pulmonary artery syst

## 2021-10-31 NOTE — TOC CM/SW Note (Signed)
Patient has orders to discharge home today. Chart reviewed. PCP is Princella Ion. On room air. No wounds. No TOC needs identified. CSW signing off. ? ?Dayton Scrape, [place] ?[phone] ? ?

## 2021-10-31 NOTE — Consult Note (Signed)
ANTICOAGULATION CONSULT NOTE ? ?Pharmacy Consult for Heparin ?Indication: chest pain/ACS ? ?No Known Allergies ? ?Patient Measurements: ?Height: '5\' 3"'$  (160 cm) ?Weight: 65.8 kg (145 lb) ?IBW/kg (Calculated) : 52.4 ?Heparin Dosing Weight: 65.6 kg ? ?Vital Signs: ?Temp: 97.6 ?F (36.4 ?C) (05/17 1254) ?Temp Source: Oral (05/17 0431) ?BP: 121/81 (05/17 1254) ?Pulse Rate: 64 (05/17 1254) ? ?Labs: ?Recent Labs  ?  10/30/21 ?9233 10/30/21 ?0955 10/30/21 ?1055 10/30/21 ?1211 10/30/21 ?1420 10/30/21 ?1914 10/31/21 ?0313 10/31/21 ?1249  ?HGB 14.5  --   --   --   --   --  14.2  --   ?HCT 43.3  --   --   --   --   --  43.6  --   ?PLT 237  --   --   --   --   --  226  --   ?APTT  --   --  33  --   --   --   --   --   ?LABPROT  --   --  13.9  --   --   --   --   --   ?INR  --   --  1.1  --   --   --   --   --   ?HEPARINUNFRC  --   --   --   --   --  0.26* 0.85* 0.58  ?CREATININE 0.98  --   --   --   --   --  1.04*  --   ?TROPONINIHS 184* 224*  --  207* 187*  --   --   --   ? ? ? ?Estimated Creatinine Clearance: 59.1 mL/min (A) (by C-G formula based on SCr of 1.04 mg/dL (H)). ? ? ?Medical History: ?Past Medical History:  ?Diagnosis Date  ? Breast cancer, stage 1, estrogen receptor negative, right ([place]) 08/19/2015  ?  18 mm; pT1c  pN0 (0/6nodes positive). ER/PR +; Her 2 neu not overexpressed  ? Family history of breast cancer   ? ? ?Medications: NKDA; Heparin Dosing Weight: 65.6 kg ?No prior history of chronic anticoagulant use PTA. ? ?Assessment: ?Pharmacy has been consulted to initiate and monitor heparin in 50yo presenting to the ED with chest pain. Troponin levels of 184 and 224, respectively. Baseline labs: aPTT 33 sec, INR 1.1, Hgb 14.5, Plts 237 ? ?Date Time  HL Rate/Comment ?0516 1914 0.26 Slightly subtherapeutic; 800 un/hr ?5/17  0313 0.85 Supratherapeutic    ?5/17  1249 0.58 Therapeutic x1 ? ?Goal of Therapy:  ?Heparin level 0.3-0.7 units/ml ?Monitor platelets by anticoagulation protocol: Yes ?  ?Plan:  ?Continue heparin  infusion to 850 units/hr ?Heparin Level Therapeutic x1 ?Recheck HL in 6 hours after rate change ?Continue to monitor H&H and platelets ? ?Tanja Port ?Pharmacy Student ?10/31/2021 ?2:16 PM ? ? ? ?

## 2021-10-31 NOTE — Progress Notes (Signed)
? ?Progress Note ? ?Patient Name: Kelsey Klein ?Date of Encounter: 10/31/2021 ? ?[facility] Cardiologist: new- Dr. Saunders Revel rounding ? ?Subjective  ? ?Denies chest pain or shortness of breath, had stress test earlier this morning, waiting on results. ? ?Inpatient Medications  ?  ?Scheduled Meds: ? anastrozole  1 mg Oral Daily  ? aspirin EC  81 mg Oral Daily  ? atorvastatin  40 mg Oral Daily  ? metoprolol tartrate  12.5 mg Oral BID  ? sodium chloride flush  3 mL Intravenous Q12H  ? ?Continuous Infusions: ? sodium chloride    ? heparin 850 Units/hr (10/31/21 0427)  ? ?PRN Meds: ?sodium chloride, acetaminophen, nitroGLYCERIN, ondansetron (ZOFRAN) IV, sodium chloride flush  ? ?Vital Signs  ?  ?Vitals:  ? 10/30/21 2355 10/31/21 0431 10/31/21 0740 10/31/21 1254  ?BP: 124/74 119/85 129/85 121/81  ?Pulse: (!) 59 74 (!) 57 64  ?Resp: '18 16 16 16  '$ ?Temp: 97.7 ?F (36.5 ?C) (!) 97.5 ?F (36.4 ?C) 98.1 ?F (36.7 ?C) 97.6 ?F (36.4 ?C)  ?TempSrc: Oral Oral    ?SpO2: 99% 99% 100% 100%  ?Weight:      ?Height:      ? ? ?Intake/Output Summary (Last 24 hours) at 10/31/2021 1341 ?Last data filed at 10/31/2021 0335 ?Gross per 24 hour  ?Intake 125.08 ml  ?Output --  ?Net 125.08 ml  ? ? ?  10/30/2021  ?  8:35 AM 08/14/2021  ? 10:24 AM 02/23/2020  ?  7:41 AM  ?Last 3 Weights  ?Weight (lbs) 145 lb 153 lb 14.4 oz 152 lb 8.9 oz  ?Weight (kg) 65.772 kg 69.809 kg 69.2 kg  ?   ? ?Telemetry  ?  ?Sinus rhythm- Personally Reviewed ? ?ECG  ?  ? - Personally Reviewed ? ?Physical Exam  ? ?GEN: No acute distress.   ?Neck: No JVD ?Cardiac: RRR, no murmurs, rubs, or gallops.  ?Respiratory: Clear to auscultation bilaterally. ?GI: Soft, nontender, non-distended  ?MS: No edema; No deformity. ?Neuro:  Nonfocal  ?Psych: Normal affect  ? ?Labs  ?  ?High Sensitivity Troponin:   ?Recent Labs  ?Lab 10/30/21 ?7425 10/30/21 ?9563 10/30/21 ?1211 10/30/21 ?1420  ?TROPONINIHS 184* 224* 207* 187*  ?   ?Chemistry ?Recent Labs  ?Lab 10/30/21 ?8756 10/31/21 ?0313  ?NA 138 141   ?K 3.6 4.3  ?CL 105 106  ?CO2 27 29  ?GLUCOSE 105* 113*  ?BUN 17 21*  ?CREATININE 0.98 1.04*  ?CALCIUM 9.1 9.2  ?GFRNONAA >60 >60  ?ANIONGAP 6 6  ?  ?Lipids  ?Recent Labs  ?Lab 10/31/21 ?0313  ?CHOL 158  ?TRIG 38  ?HDL 82  ?[place] 68  ?CHOLHDL 1.9  ?  ?Hematology ?Recent Labs  ?Lab 10/30/21 ?4332 10/31/21 ?0313  ?WBC 4.4 5.6  ?RBC 4.87 4.88  ?HGB 14.5 14.2  ?HCT 43.3 43.6  ?MCV 88.9 89.3  ?MCH 29.8 29.1  ?MCHC 33.5 32.6  ?RDW 12.9 13.1  ?PLT 237 226  ? ?Thyroid No results for input(s): TSH, FREET4 in the last 168 hours.  ?BNPNo results for input(s): BNP, PROBNP in the last 168 hours.  ?DDimer No results for input(s): DDIMER in the last 168 hours.  ? ?Radiology  ?  ?DG Chest 2 View ? ?Result Date: 10/30/2021 ?CLINICAL DATA:  RIGHT arm pain EXAM: CHEST - 2 VIEW COMPARISON:  None Available. FINDINGS: Normal mediastinum and cardiac silhouette. Normal pulmonary vasculature. No evidence of effusion, infiltrate, or pneumothorax. No acute bony abnormality. IMPRESSION: No acute cardiopulmonary process. Electronically Signed  By: Suzy Bouchard M.D.   On: 10/30/2021 09:09  ? ?CT Angio Chest PE W and/or Wo Contrast ? ?Result Date: 10/30/2021 ?CLINICAL DATA:  Pulmonary embolism (PE) suspected, high prob EXAM: CT ANGIOGRAPHY CHEST WITH CONTRAST TECHNIQUE: Multidetector CT imaging of the chest was performed using the standard protocol during bolus administration of intravenous contrast. Multiplanar CT image reconstructions and MIPs were obtained to evaluate the vascular anatomy. RADIATION DOSE REDUCTION: This exam was performed according to the departmental dose-optimization program which includes automated exposure control, adjustment of the mA and/or kV according to patient size and/or use of iterative reconstruction technique. CONTRAST:  38m OMNIPAQUE IOHEXOL 350 MG/ML SOLN COMPARISON:  Chest radiograph from the same day. FINDINGS: Cardiovascular: Satisfactory opacification of the pulmonary arteries to the segmental level.  No evidence of pulmonary embolism. Normal heart size. No pericardial effusion. Mediastinum/Nodes: No enlarged mediastinal, hilar, or axillary lymph nodes. Thyroid gland, trachea, and esophagus demonstrate no significant findings. Lungs/Pleura: Subsegmental left basilar atelectasis. No consolidation. No pleural effusions or pneumothorax Upper Abdomen: No acute abnormality. Musculoskeletal: No acute fracture.  Bilateral breast implants. Review of the MIP images confirms the above findings. IMPRESSION: 1. No evidence of acute pulmonary embolism. 2. Subsegmental left lower lobe atelectasis. Electronically Signed   By: FMargaretha SheffieldM.D.   On: 10/30/2021 10:27  ? ?ECHOCARDIOGRAM COMPLETE ? ?Result Date: 10/30/2021 ?   ECHOCARDIOGRAM REPORT   Patient Name:   Kelsey HARBISONDate of Exam: 10/30/2021 Medical Rec #:  [id number]       Height:       63.0 in Accession #:    [id number]      Weight:       145.0 lb Date of Birth:  [birth year]       BSA:          1.687 m? Patient Age:    51years         BP:           123/83 mmHg Patient Gender: F                HR:           69 bpm. Exam Location:  [facility] Procedure: 2D Echo, Cardiac Doppler and Color Doppler Indications:     Chest pain R07.9  History:         Patient has no prior history of Echocardiogram examinations.                  NSTEMI.  Sonographer:     JSherrie SportReferring Phys:  [id number] END Diagnosing Phys: CNelva BushMD IMPRESSIONS  1. Left ventricular ejection fraction, by estimation, is 65 to 70%. The left ventricle has normal function. The left ventricle has no regional wall motion abnormalities. There is mild left ventricular hypertrophy. Left ventricular diastolic parameters were normal.  2. Right ventricular systolic function is normal. The right ventricular size is normal. There is normal pulmonary artery systolic pressure.  3. The mitral valve is normal in structure. Trivial mitral valve regurgitation. No evidence of mitral stenosis.  4. The  aortic valve is tricuspid. Aortic valve regurgitation is not visualized. No aortic stenosis is present.  5. The inferior vena cava is normal in size with <50% respiratory variability, suggesting right atrial pressure of 8 mmHg. FINDINGS  Left Ventricle: Left ventricular ejection fraction, by estimation, is 65 to 70%. The left ventricle has normal function. The left ventricle has no regional wall motion abnormalities. The  left ventricular internal cavity size was normal in size. There is  mild left ventricular hypertrophy. Left ventricular diastolic parameters were normal. Right Ventricle: The right ventricular size is normal. No increase in right ventricular wall thickness. Right ventricular systolic function is normal. There is normal pulmonary artery systolic pressure. The tricuspid regurgitant velocity is 2.42 m/s, and  with an assumed right atrial pressure of 8 mmHg, the estimated right ventricular systolic pressure is 16.1 mmHg. Left Atrium: Left atrial size was normal in size. Right Atrium: Right atrial size was normal in size. Pericardium: There is no evidence of pericardial effusion. Mitral Valve: The mitral valve is normal in structure. Trivial mitral valve regurgitation. No evidence of mitral valve stenosis. MV peak gradient, 3.6 mmHg. The mean mitral valve gradient is 1.0 mmHg. Tricuspid Valve: The tricuspid valve is grossly normal. Tricuspid valve regurgitation is trivial. Aortic Valve: The aortic valve is tricuspid. Aortic valve regurgitation is not visualized. No aortic stenosis is present. Aortic valve mean gradient measures 4.0 mmHg. Aortic valve peak gradient measures 5.9 mmHg. Aortic valve area, by VTI measures 2.37 cm?. Pulmonic Valve: The pulmonic valve was normal in structure. Pulmonic valve regurgitation is trivial. No evidence of pulmonic stenosis. Aorta: The aortic root is normal in size and structure. Venous: The inferior vena cava is normal in size with less than 50% respiratory  variability, suggesting right atrial pressure of 8 mmHg. IAS/Shunts: The interatrial septum was not well visualized.  LEFT VENTRICLE PLAX 2D LVIDd:         3.60 cm   Diastology LVIDs:         2.30 cm   LV e' medial:

## 2021-11-01 LAB — LIPOPROTEIN A (LPA): Lipoprotein (a): 68.5 nmol/L — ABNORMAL HIGH (ref <75.0)

## 2021-11-04 NOTE — Progress Notes (Signed)
Cardiology Office Note    Date:  11/05/2021   ID:  Kelsey Klein, DOB 02-10-1971, MRN [id number]  PCP:  [facility] [place]  Cardiologist:  Nelva Bush, MD  Electrophysiologist:  None   Chief Complaint: Hospital follow up  History of Present Illness:   Kelsey Klein is a 51 y.o. female with history of breast cancer status post bilateral mastectomy who presents for hospital follow-up as outlined below.  She was admitted to [facility] on 10/30/2021 through 10/31/2021 with 3 separate episodes of chest pressure associated with shortness of breath, flushing, and sweating as well as 1 episode of right upper extremity discomfort.  Symptoms were nonexertional and would typically last for 10 minutes.  She is a non-smoker and has no history of diabetes, hypertension, or hyperlipidemia.  There was no family history of premature coronary artery disease.  EKG showed sinus rhythm with no acute ST-T changes.  High-sensitivity troponin peaked at 224 with subsequent downtrend.  CTA of the chest was negative for PE.  Echo showed an EF of 65 to 70%, no regional wall motion abnormalities, mild LVH, normal LV diastolic function parameters, normal RV systolic function, ventricular cavity size, and PASP, and trivial mitral regurgitation.  Lexiscan MPI showed no evidence of ischemia with a normal LVEF.  No significant coronary artery calcification was noted.  Overall, this was a low risk study.  She comes in today and is overall doing reasonably well.  Since she was discharged from the hospital, she did have 3 separate episodes of sharp right arm discomfort on the evening of 5/20 with each episode lasting approximately 5 to 10 minutes followed by spontaneous resolution.  No further chest discomfort.  No associated dyspnea, palpitations, diaphoresis, nausea, vomiting, dizziness, presyncope, or syncope.  She has not yet resumed [employer].  No family history of premature coronary disease or sudden  cardiac death.  Currently symptom-free.   Labs independently reviewed: 10/2021 - Hgb 14.2, PLT 226, potassium 4.3, BUN 21, serum creatinine 1.04, TC 158, TG 38, HDL 82, LDL 68, LPA less than 75, A1c 5.8 07/2021 - albumin 4.1, AST/ALT normal  Past Medical History:  Diagnosis Date   Breast cancer, stage 1, estrogen receptor negative, right ([place]) 08/19/2015    18 mm; pT1c  pN0 (0/6nodes positive). ER/PR +; Her 2 neu not overexpressed   Family history of breast cancer     Past Surgical History:  Procedure Laterality Date   BREAST BIOPSY Left 05/27/2017   U/S core Bx- invasive mammary ER/PR positive Her2 negative   BREAST BIOPSY Left 05/27/2017   Affirm Bx- DCIS   BREAST RECONSTRUCTION WITH PLACEMENT OF TISSUE EXPANDER AND FLEX HD (ACELLULAR HYDRATED DERMIS) Bilateral 08/18/2017   Procedure: BREAST RECONSTRUCTION WITH PLACEMENT OF TISSUE EXPANDER AND FLEX HD (ACELLULAR HYDRATED DERMIS);  Surgeon: Wallace Going, DO;  Location: [facility];  Service: Plastics;  Laterality: Bilateral;   LIPOSUCTION WITH LIPOFILLING Bilateral 05/21/2018   Procedure: liposuction with fat grafting and revolve;  Surgeon: Wallace Going, DO;  Location: [facility];  Service: Plastics;  Laterality: Bilateral;   LIPOSUCTION WITH LIPOFILLING Bilateral 02/23/2020   Procedure: Fat filling to bilateral breasts for symmetry;  Surgeon: Wallace Going, DO;  Location: [facility];  Service: Plastics;  Laterality: Bilateral;   MASTECTOMY W/ SENTINEL NODE BIOPSY Bilateral 08/18/2017   Procedure: MASTECTOMY WITH SENTINEL LYMPH NODE BIOPSY,FROZEN SECTION;  Surgeon: Robert Bellow, MD;  Location: [facility];  Service: General;  Laterality:  Bilateral;   REMOVAL OF TISSUE EXPANDER AND PLACEMENT OF IMPLANT Bilateral 12/10/2017   Procedure: REMOVAL OF TISSUE EXPANDERS AND PLACEMENT OF IMPLANTS;  Surgeon: Wallace Going, DO;  Location: [facility];  Service: Plastics;  Laterality:  Bilateral;    Current Medications: Current Meds  Medication Sig   anastrozole (ARIMIDEX) 1 MG tablet Take 1 tablet (1 mg total) by mouth daily.    Allergies:   Patient has no known allergies.   Social History   Socioeconomic History   Marital status: Single    Spouse name: Not on file   Number of children: Not on file   Years of education: Not on file   Highest education level: Not on file  Occupational History   Not on file  Tobacco Use   Smoking status: Never   Smokeless tobacco: Never  Vaping Use   Vaping Use: Never used  Substance and Sexual Activity   Alcohol use: Not Currently   Drug use: No   Sexual activity: Yes    Birth control/protection: Condom  Other Topics Concern   Not on file  Social History Narrative   Not on file   Social Determinants of Health   Financial Resource Strain: Not on file  Food Insecurity: Not on file  Transportation Needs: Not on file  Physical Activity: Not on file  Stress: Not on file  Social Connections: Not on file     Family History:  The patient's family history includes Alzheimer's disease in her maternal grandmother; Arthritis in her mother; Breast cancer in her paternal aunt and paternal aunt; Cirrhosis in her father; Hypertension in her father and sister.  ROS:   12-point review of systems is negative unless otherwise noted in the HPI.   EKGs/Labs/Other Studies Reviewed:    Studies reviewed were summarized above. The additional studies were reviewed today:  2D echo 10/30/2021: 1. Left ventricular ejection fraction, by estimation, is 65 to 70%. The  left ventricle has normal function. The left ventricle has no regional  wall motion abnormalities. There is mild left ventricular hypertrophy.  Left ventricular diastolic parameters  were normal.   2. Right ventricular systolic function is normal. The right ventricular  size is normal. There is normal pulmonary artery systolic pressure.   3. The mitral valve is normal  in structure. Trivial mitral valve  regurgitation. No evidence of mitral stenosis.   4. The aortic valve is tricuspid. Aortic valve regurgitation is not  visualized. No aortic stenosis is present.   5. The inferior vena cava is normal in size with <50% respiratory  variability, suggesting right atrial pressure of 8 mmHg. __________  Carlton Adam MPI 10/31/2021:   The study is normal.. The study is low risk.   No ST deviation was noted.   LV perfusion is normal.   Left ventricular function is normal. calculated LVEF is 73%.   No significant coronary artery calcifications noted.   EKG:  EKG is not ordered today.    Recent Labs: 08/14/2021: ALT 19 10/31/2021: BUN 21; Creatinine, Ser 1.04; Hemoglobin 14.2; Platelets 226; Potassium 4.3; Sodium 141  Recent Lipid Panel    Component Value Date/Time   CHOL 158 10/31/2021 0313   TRIG 38 10/31/2021 0313   HDL 82 10/31/2021 0313   CHOLHDL 1.9 10/31/2021 0313   VLDL 8 10/31/2021 0313   LDLCALC 68 10/31/2021 0313    PHYSICAL EXAM:    VS:  BP 110/70 (BP Location: Left Arm, Patient Position: Sitting, Cuff Size: Normal)  Pulse 64   Ht 5' 2.5" (1.588 m)   Wt 157 lb (71.2 kg)   LMP 10/08/2021 Comment: negative pregnancy Klein  SpO2 98%   BMI 28.26 kg/m   BMI: Body mass index is 28.26 kg/m.  Physical Exam Vitals reviewed.  Constitutional:      Appearance: She is well-developed.  HENT:     Head: Normocephalic and atraumatic.  Eyes:     General:        Right eye: No discharge.        Left eye: No discharge.  Cardiovascular:     Rate and Rhythm: Normal rate and regular rhythm.     Pulses:          Posterior tibial pulses are 2+ on the right side and 2+ on the left side.     Heart sounds: Normal heart sounds, S1 normal and S2 normal. Heart sounds not distant. No midsystolic click and no opening snap. No murmur heard.   No friction rub.  Pulmonary:     Effort: Pulmonary effort is normal. No respiratory distress.     Breath sounds:  Normal breath sounds. No decreased breath sounds, wheezing or rales.  Chest:     Chest wall: No tenderness.  Abdominal:     General: There is no distension.     Palpations: Abdomen is soft.  Musculoskeletal:     Cervical back: Normal range of motion.     Right lower leg: No edema.     Left lower leg: No edema.  Skin:    General: Skin is warm and dry.     Nails: There is no clubbing.  Neurological:     Mental Status: She is alert and oriented to person, place, and time.  Psychiatric:        Speech: Speech normal.        Behavior: Behavior normal.        Thought Content: Thought content normal.        Judgment: Judgment normal.    Wt Readings from Last 3 Encounters:  11/05/21 157 lb (71.2 kg)  10/30/21 145 lb (65.8 kg)  08/14/21 153 lb 14.4 oz (69.8 kg)     ASSESSMENT & PLAN:   History of chest pressure and right arm pain with elevated high-sensitivity troponin: Currently chest pain free.  She did have 3 separate episodes of sharp right arm discomfort on 5/20 with each episode lasting 5 to 10 minutes followed by spontaneous resolution.  During recent admission, she was noted to have a mildly elevated high-sensitivity troponin peaking at 224.  Echo showed preserved LV systolic function with no structural abnormalities.  Lexiscan MPI showed no evidence of ischemia.  CT attenuated corrected images showed no evidence of coronary artery calcification.  Cannot exclude possible vasospasm contributing to her symptoms and elevated high-sensitivity troponin.  We did discuss optimization of antianginal therapy versus proceeding with coronary CTA or cardiac cath.  Given CT attenuated corrected images showed no evidence of coronary artery calcification, I would favor proceeding directly with cardiac cath if further testing is indicated.  She will research isosorbide mononitrate and amlodipine for potential antianginal/vasospasm therapy.  She will update [place] in 2 weeks time, sooner if indicated.      Disposition: F/u with Dr. Saunders Revel or an APP in 1 month.   Medication Adjustments/Labs and Tests Ordered: Current medicines are reviewed at length with the patient today.  Concerns regarding medicines are outlined above. Medication changes, Labs and Tests ordered today are summarized  above and listed in the Patient Instructions accessible in Encounters.   Signed, Christell Faith, PA-C 11/05/2021 3:43 PM     [address] [place], [place] [address] [phone]

## 2021-11-05 ENCOUNTER — Encounter: Payer: Self-pay | Admitting: Physician Assistant

## 2021-11-05 ENCOUNTER — Ambulatory Visit (INDEPENDENT_AMBULATORY_CARE_PROVIDER_SITE_OTHER): Payer: Medicaid Other | Admitting: Physician Assistant

## 2021-11-05 VITALS — BP 110/70 | HR 64 | Ht 62.5 in | Wt 157.0 lb

## 2021-11-05 DIAGNOSIS — R079 Chest pain, unspecified: Secondary | ICD-10-CM | POA: Diagnosis not present

## 2021-11-05 DIAGNOSIS — M79601 Pain in right arm: Secondary | ICD-10-CM

## 2021-11-05 NOTE — Patient Instructions (Signed)
Give [place] a call in 2 weeks with update.   Medications discussed today were as follows: Amlodipine (Norvasc) Isosorbide mononitrate (Imdur)   Medication Instructions:  No changes at this time.   *If you need a refill on your cardiac medications before your next appointment, please call your pharmacy*   Lab Work: None  If you have labs (blood work) drawn today and your tests are completely normal, you will receive your results only by: [facility] (if you have MyChart) OR A paper copy in the mail If you have any lab test that is abnormal or we need to change your treatment, we will call you to review the results.   Testing/Procedures: None   Follow-Up: At [facility], you and your health needs are our priority.  As part of our continuing mission to provide you with exceptional heart care, we have created designated Provider Care Teams.  These Care Teams include your primary Cardiologist (physician) and Advanced Practice Providers (APPs -  Physician Assistants and Nurse Practitioners) who all work together to provide you with the care you need, when you need it.   Your next appointment:   1 month(s)  The format for your next appointment:   In Person  Provider:   Nelva Bush, MD or Christell Faith, PA-C       Important Information About Sugar

## 2021-12-11 ENCOUNTER — Ambulatory Visit: Payer: Medicaid Other | Admitting: Physician Assistant

## 2022-02-07 ENCOUNTER — Inpatient Hospital Stay
Admission: EM | Admit: 2022-02-07 | Discharge: 2022-02-09 | DRG: 247 | Disposition: A | Discharge status: Home / Self Care | Payer: Medicaid Other | Attending: Internal Medicine | Admitting: Internal Medicine

## 2022-02-07 ENCOUNTER — Other Ambulatory Visit: Payer: Self-pay

## 2022-02-07 ENCOUNTER — Emergency Department: Payer: Medicaid Other

## 2022-02-07 DIAGNOSIS — Z8249 Family history of ischemic heart disease and other diseases of the circulatory system: Secondary | ICD-10-CM

## 2022-02-07 DIAGNOSIS — R079 Chest pain, unspecified: Principal | ICD-10-CM

## 2022-02-07 DIAGNOSIS — Z9013 Acquired absence of bilateral breasts and nipples: Secondary | ICD-10-CM

## 2022-02-07 DIAGNOSIS — I25119 Atherosclerotic heart disease of native coronary artery with unspecified angina pectoris: Secondary | ICD-10-CM | POA: Diagnosis present

## 2022-02-07 DIAGNOSIS — I214 Non-ST elevation (NSTEMI) myocardial infarction: Principal | ICD-10-CM | POA: Diagnosis present

## 2022-02-07 DIAGNOSIS — I2510 Atherosclerotic heart disease of native coronary artery without angina pectoris: Secondary | ICD-10-CM | POA: Diagnosis not present

## 2022-02-07 DIAGNOSIS — Z79811 Long term (current) use of aromatase inhibitors: Secondary | ICD-10-CM

## 2022-02-07 DIAGNOSIS — N179 Acute kidney failure, unspecified: Secondary | ICD-10-CM | POA: Diagnosis present

## 2022-02-07 DIAGNOSIS — C50512 Malignant neoplasm of lower-outer quadrant of left female breast: Secondary | ICD-10-CM | POA: Diagnosis present

## 2022-02-07 DIAGNOSIS — Z803 Family history of malignant neoplasm of breast: Secondary | ICD-10-CM

## 2022-02-07 LAB — CBC WITH DIFFERENTIAL/PLATELET
Abs Immature Granulocytes: 0.01 10*3/uL (ref 0.00–0.07)
Basophils Absolute: 0 10*3/uL (ref 0.0–0.1)
Basophils Relative: 0 %
Eosinophils Absolute: 0.1 10*3/uL (ref 0.0–0.5)
Eosinophils Relative: 2 %
HCT: 42.7 % (ref 36.0–46.0)
Hemoglobin: 14.3 g/dL (ref 12.0–15.0)
Immature Granulocytes: 0 %
Lymphocytes Relative: 25 %
Lymphs Abs: 1.8 10*3/uL (ref 0.7–4.0)
MCH: 30.5 pg (ref 26.0–34.0)
MCHC: 33.5 g/dL (ref 30.0–36.0)
MCV: 91 fL (ref 80.0–100.0)
Monocytes Absolute: 0.7 10*3/uL (ref 0.1–1.0)
Monocytes Relative: 9 %
Neutro Abs: 4.5 10*3/uL (ref 1.7–7.7)
Neutrophils Relative %: 64 %
Platelets: 170 10*3/uL (ref 150–400)
RBC: 4.69 MIL/uL (ref 3.87–5.11)
RDW: 13.3 % (ref 11.5–15.5)
WBC: 7.2 10*3/uL (ref 4.0–10.5)
nRBC: 0 % (ref 0.0–0.2)

## 2022-02-07 LAB — COMPREHENSIVE METABOLIC PANEL
ALT: 17 U/L (ref 0–44)
AST: 43 U/L — ABNORMAL HIGH (ref 15–41)
Albumin: 4.1 g/dL (ref 3.5–5.0)
Alkaline Phosphatase: 45 U/L (ref 38–126)
Anion gap: 9 (ref 5–15)
BUN: 18 mg/dL (ref 6–20)
CO2: 25 mmol/L (ref 22–32)
Calcium: 8.8 mg/dL — ABNORMAL LOW (ref 8.9–10.3)
Chloride: 106 mmol/L (ref 98–111)
Creatinine, Ser: 1.11 mg/dL — ABNORMAL HIGH (ref 0.44–1.00)
GFR, Estimated: >60 mL/min (ref >60)
Glucose, Bld: 106 mg/dL — ABNORMAL HIGH (ref 70–99)
Potassium: 4.2 mmol/L (ref 3.5–5.1)
Sodium: 140 mmol/L (ref 135–145)
Total Bilirubin: 0.8 mg/dL (ref 0.3–1.2)
Total Protein: 7 g/dL (ref 6.5–8.1)

## 2022-02-07 LAB — PROTIME-INR
INR: 1.1 (ref 0.8–1.2)
Prothrombin Time: 13.6 seconds (ref 11.4–15.2)

## 2022-02-07 LAB — TROPONIN I (HIGH SENSITIVITY)
Troponin I (High Sensitivity): 1188 ng/L (ref <18)
Troponin I (High Sensitivity): 1010 ng/L (ref <18)

## 2022-02-07 LAB — APTT: aPTT: 30 seconds (ref 24–36)

## 2022-02-07 MED ORDER — HEPARIN (PORCINE) 25000 UT/250ML-% IV SOLN
850.0000 [IU]/h | INTRAVENOUS | Status: DC
  Administered 2022-02-07: 850 [IU]/h via INTRAVENOUS
  Filled 2022-02-07: qty 250

## 2022-02-07 MED ORDER — MORPHINE SULFATE (PF) 2 MG/ML IV SOLN: 2.0000 mg | INTRAVENOUS | Status: DC | PRN

## 2022-02-07 MED ORDER — ASPIRIN 81 MG PO TBEC
81.0000 mg | DELAYED_RELEASE_TABLET | Freq: Every day | ORAL | Status: DC
  Administered 2022-02-08 – 2022-02-09 (×2): 81 mg via ORAL
  Filled 2022-02-07 (×2): qty 1

## 2022-02-07 MED ORDER — ALPRAZOLAM 0.25 MG PO TABS: 0.2500 mg | ORAL_TABLET | Freq: Two times a day (BID) | ORAL | Status: DC | PRN

## 2022-02-07 MED ORDER — ASPIRIN 300 MG RE SUPP: 300.0000 mg | RECTAL | Status: DC

## 2022-02-07 MED ORDER — NITROGLYCERIN 0.4 MG SL SUBL: 0.4000 mg | SUBLINGUAL_TABLET | SUBLINGUAL | Status: DC | PRN

## 2022-02-07 MED ORDER — TRAZODONE HCL 50 MG PO TABS: 25.0000 mg | ORAL_TABLET | Freq: Every evening | ORAL | Status: DC | PRN

## 2022-02-07 MED ORDER — HEPARIN BOLUS VIA INFUSION
4000.0000 [IU] | Freq: Once | INTRAVENOUS | Status: AC
  Administered 2022-02-07: 4000 [IU] via INTRAVENOUS
  Filled 2022-02-07: qty 4000

## 2022-02-07 MED ORDER — ONDANSETRON HCL 4 MG/2ML IJ SOLN: 4.0000 mg | Freq: Four times a day (QID) | INTRAMUSCULAR | Status: DC | PRN

## 2022-02-07 MED ORDER — METOPROLOL TARTRATE 25 MG PO TABS
12.5000 mg | ORAL_TABLET | Freq: Two times a day (BID) | ORAL | Status: DC
  Administered 2022-02-07 – 2022-02-09 (×4): 12.5 mg via ORAL
  Filled 2022-02-07 (×4): qty 1

## 2022-02-07 MED ORDER — ASPIRIN 81 MG PO CHEW
324.0000 mg | CHEWABLE_TABLET | Freq: Once | ORAL | Status: AC
  Administered 2022-02-07: 324 mg via ORAL
  Filled 2022-02-07: qty 4

## 2022-02-07 MED ORDER — SODIUM CHLORIDE 0.9 % IV SOLN: INTRAVENOUS | Status: DC

## 2022-02-07 MED ORDER — ACETAMINOPHEN 325 MG PO TABS
650.0000 mg | ORAL_TABLET | ORAL | Status: DC | PRN
  Administered 2022-02-08 – 2022-02-09 (×2): 650 mg via ORAL
  Filled 2022-02-07: qty 2

## 2022-02-07 MED ORDER — MAGNESIUM HYDROXIDE 400 MG/5ML PO SUSP: 30.0000 mL | Freq: Every day | ORAL | Status: DC | PRN

## 2022-02-07 MED ORDER — ATORVASTATIN CALCIUM 80 MG PO TABS
80.0000 mg | ORAL_TABLET | Freq: Every day | ORAL | Status: DC
  Administered 2022-02-07 – 2022-02-09 (×2): 80 mg via ORAL
  Filled 2022-02-07: qty 4
  Filled 2022-02-07: qty 1

## 2022-02-07 MED ORDER — NITROGLYCERIN 0.4 MG/SPRAY TL SOLN: 1.0000 | Status: DC | PRN

## 2022-02-07 MED ORDER — ASPIRIN 81 MG PO CHEW: 324.0000 mg | CHEWABLE_TABLET | ORAL | Status: DC

## 2022-02-07 NOTE — ED Triage Notes (Addendum)
Pt presents to ED with c/o of SOB, CP, pt states CP is radiating down left arm. Pt states she was admitted before for the same and states "everything was normal but my troponins were elevated". Pt states she felt like she was getting a cold yesterday. Pt is A&Ox4.    EMS placed 20G IV to L AC, 500 ml of NS given by EMS

## 2022-02-07 NOTE — Assessment & Plan Note (Addendum)
On Arimidex for history of breast cancer, held on admission. Pt is status post bilateral mastectomy and breast reconstruction in March 2019.   Initially on tamoxifen but it was discontinued due to the increasing depression and mood changes.  Transitioned to Arimidex since then and was expected to have a total of 5 years of therapy ending in March 2024 per Dr. Gary Fleet last visit on 08/14/2021.

## 2022-02-07 NOTE — H&P (Signed)
[place]   PATIENT NAME: Kelsey Klein    MR#:  [id number]  DATE OF BIRTH:  1970-07-12  DATE OF ADMISSION:  02/07/2022  PRIMARY CARE PHYSICIAN: [facility] [place]   Patient is coming from: Home  REQUESTING/REFERRING PHYSICIAN: Nance Pear, MD  CHIEF COMPLAINT:   Chief Complaint  Patient presents with   Shortness of Breath   Chest Pain    HISTORY OF PRESENT ILLNESS:  Kelsey Klein is a 51 y.o. African American female with medical history significant for breast cancer status postmastectomy and sentinel node biopsy followed by breast reconstruction in March 2019, on oral chemotherapy, presented to the ER with acute onset of midsternal chest pain felt as pressure and graded 7/10 in severity with associated radiation to her left arm.  She had nausea and vomiting 2 hours after her chest pain.  She admitted to associated dyspnea.  Her chest pain lasted about 30 minutes and her dyspnea was longer.  No cough or wheezing or hemoptysis.  No leg pain or edema or recent travels or surgeries.  She had loose bowel movement in the ER.  No dysuria, oliguria or hematuria or flank pain.  No bleeding diathesis.         ED Course: When she came to the ER, vital signs were within normal.  Labs revealed a creatinine 1.11 with unremarkable CMP and CBC otherwise.  High-sensitivity troponin I though came significantly elevated at 1188 and repeat was 1010. EKG as reviewed by me : EKG showed sinus bradycardia with a rate of 52 with possible left atrial enlargement and T wave inversion anterolaterally and inferiorly with minimal voltage criteria for LVH Imaging: Two-view chest x-ray showed no acute cardiopulmonary disease.  The patient was given 4 baby aspirin and was started on IV heparin with bolus and drip.  She will be admitted to a progressive unit bed for further evaluation and management. PAST MEDICAL HISTORY:   Past Medical History:  Diagnosis Date   Breast cancer,  stage 1, estrogen receptor negative, right ([place]) 08/19/2015    18 mm; pT1c  pN0 (0/6nodes positive). ER/PR +; Her 2 neu not overexpressed   Family history of breast cancer     PAST SURGICAL HISTORY:   Past Surgical History:  Procedure Laterality Date   BREAST BIOPSY Left 05/27/2017   U/S core Bx- invasive mammary ER/PR positive Her2 negative   BREAST BIOPSY Left 05/27/2017   Affirm Bx- DCIS   BREAST RECONSTRUCTION WITH PLACEMENT OF TISSUE EXPANDER AND FLEX HD (ACELLULAR HYDRATED DERMIS) Bilateral 08/18/2017   Procedure: BREAST RECONSTRUCTION WITH PLACEMENT OF TISSUE EXPANDER AND FLEX HD (ACELLULAR HYDRATED DERMIS);  Surgeon: Wallace Going, DO;  Location: [facility];  Service: Plastics;  Laterality: Bilateral;   LIPOSUCTION WITH LIPOFILLING Bilateral 05/21/2018   Procedure: liposuction with fat grafting and revolve;  Surgeon: Wallace Going, DO;  Location: [facility];  Service: Plastics;  Laterality: Bilateral;   LIPOSUCTION WITH LIPOFILLING Bilateral 02/23/2020   Procedure: Fat filling to bilateral breasts for symmetry;  Surgeon: Wallace Going, DO;  Location: [employer];  Service: Plastics;  Laterality: Bilateral;   MASTECTOMY W/ SENTINEL NODE BIOPSY Bilateral 08/18/2017   Procedure: MASTECTOMY WITH SENTINEL LYMPH NODE BIOPSY,FROZEN SECTION;  Surgeon: Robert Bellow, MD;  Location: [facility];  Service: General;  Laterality: Bilateral;   REMOVAL OF TISSUE EXPANDER AND PLACEMENT OF IMPLANT Bilateral 12/10/2017   Procedure: REMOVAL OF TISSUE EXPANDERS AND PLACEMENT OF IMPLANTS;  Surgeon: Wallace Going, DO;  Location: [facility];  Service: Plastics;  Laterality: Bilateral;    SOCIAL HISTORY:   Social History   Tobacco Use   Smoking status: Never   Smokeless tobacco: Never  Substance Use Topics   Alcohol use: Not Currently    FAMILY HISTORY:   Family History  Problem Relation Age of Onset   Arthritis Mother     Cirrhosis Father        deceased 60; adopted   Hypertension Father    Hypertension Sister    Alzheimer's disease Maternal Grandmother    Breast cancer Paternal Aunt        age at dx unknown   Breast cancer Paternal Aunt        age at dx unknown    DRUG ALLERGIES:  No Known Allergies  REVIEW OF SYSTEMS:   As per history of present illness. All pertinent systems were reviewed above. Constitutional, HEENT, cardiovascular, respiratory, GI, GU, musculoskeletal, neuro, psychiatric, endocrine, integumentary and hematologic systems were reviewed and are otherwise negative/unremarkable except for positive findings mentioned above in the HPI.   MEDICATIONS AT HOME:   Prior to Admission medications   Medication Sig Start Date End Date Taking? Authorizing Provider  Adapalene 0.3 % gel SMARTSIG:sparingly Topical Every Night 01/02/22  Yes [provider]  anastrozole (ARIMIDEX) 1 MG tablet Take 1 tablet (1 mg total) by mouth daily. Patient not taking: Reported on 02/07/2022 01/29/21   Jacquelin Hawking, NP      VITAL SIGNS:  Blood pressure 122/65, pulse 72, temperature 98 F (36.7 C), temperature source Oral, resp. rate 19, height 5' 3"  (1.6 m), weight 69.9 kg, last menstrual period 12/15/2021, SpO2 100 %.  PHYSICAL EXAMINATION:  Physical Exam  GENERAL:  51 y.o.-year-old African-American female patient lying in the bed with no acute distress.  EYES: Pupils equal, round, reactive to light and accommodation. No scleral icterus. Extraocular muscles intact.  HEENT: Head atraumatic, normocephalic. Oropharynx and nasopharynx clear.  NECK:  Supple, no jugular venous distention. No thyroid enlargement, no tenderness.  LUNGS: Normal breath sounds bilaterally, no wheezing, rales,rhonchi or crepitation. No use of accessory muscles of respiration.  CARDIOVASCULAR: Regular rate and rhythm, S1, S2 normal. No murmurs, rubs, or gallops.  ABDOMEN: Soft, nondistended, nontender. Bowel sounds present.  No organomegaly or mass.  EXTREMITIES: No pedal edema, cyanosis, or clubbing.  NEUROLOGIC: Cranial nerves II through XII are intact. Muscle strength 5/5 in all extremities. Sensation intact. Gait not checked.  PSYCHIATRIC: The patient is alert and oriented x 3.  Normal affect and good eye contact. SKIN: No obvious rash, lesion, or ulcer.   LABORATORY PANEL:   CBC Recent Labs  Lab 02/07/22 1851  WBC 7.2  HGB 14.3  HCT 42.7  PLT 170   ------------------------------------------------------------------------------------------------------------------  Chemistries  Recent Labs  Lab 02/07/22 1851  NA 140  K 4.2  CL 106  CO2 25  GLUCOSE 106*  BUN 18  CREATININE 1.11*  CALCIUM 8.8*  AST 43*  ALT 17  ALKPHOS 45  BILITOT 0.8   ------------------------------------------------------------------------------------------------------------------  Cardiac Enzymes No results for input(s): "TROPONINI" in the last 168 hours. ------------------------------------------------------------------------------------------------------------------  RADIOLOGY:  DG Chest 2 View  Result Date: 02/07/2022 CLINICAL DATA:  Chest pain and shortness of breath. EXAM: CHEST - 2 VIEW COMPARISON:  Chest radiograph dated 10/30/2021. FINDINGS: No focal consolidation, pleural effusion, pneumothorax. The cardiac silhouette is within limits. No acute osseous pathology. IMPRESSION: No active cardiopulmonary disease. Electronically Signed  By: Anner Crete M.D.   On: 02/07/2022 19:29      IMPRESSION AND PLAN:  Assessment and Plan: * NSTEMI (non-ST elevated myocardial infarction) [facility]) - The patient will be admitted to a progressive unit bed. - We will continue her on IV heparin drip. - She will be placed on aspirin as well as as needed sublingual nitroglycerin and IV morphine sulfate for pain. - We will place on high-dose statin therapy starting tonight as well as start small dose beta-blocker therapy with  Lopressor.. - We will check fasting lipids in AM. - We will follow serial troponins. - 2D echo and cardiology consult will be obtained. - I notified Dr. Garen Lah about the patient. - I will hold off Arimidex due to its thromboembolic side effects pending cardiology consult.    Primary cancer of lower-outer quadrant of left breast [facility]) - She takes Arimidex for history of breast cancer. - She is status post bilateral mastectomy and breast reconstruction in March 2019.  She was placed initially on tamoxifen but it was discontinued due to the increasing depression and mood changes.  She was then transitioned to Arimidex since then and was expected to have a total of 5 years of therapy ending in March 2024 per Dr. Gary Fleet last visit on 08/14/2021. - I am holding off Arimidex as this could be the culprit for her MI pending cardiology evaluation. - She may need oncology evaluation later on to assess alternative therapy versus discontinuation since she had only 7 months left to complete 5 years.       DVT prophylaxis: IV heparin drip. Advanced Care Planning:  Code Status: full code.  Family Communication:  The plan of care was discussed in details with the patient (and family). I answered all questions. The patient agreed to proceed with the above mentioned plan. Further management will depend upon hospital course. Disposition Plan: Back to previous home environment Consults called: Cardiology. All the records are reviewed and case discussed with ED provider.  Status is: Inpatient    At the time of the admission, it appears that the appropriate admission status for this patient is inpatient.  This is judged to be reasonable and necessary in order to provide the required intensity of service to ensure the patient's safety given the presenting symptoms, physical exam findings and initial radiographic and laboratory data in the context of comorbid conditions.  The patient requires inpatient  status due to high intensity of service, high risk of further deterioration and high frequency of surveillance required.  I certify that at the time of admission, it is my clinical judgment that the patient will require inpatient hospital care extending more than 2 midnights.                            Dispo: The patient is from: Home              Anticipated d/c is to: Home              Patient currently is not medically stable to d/c.              Difficult to place patient: No  Christel Mormon M.D on 02/07/2022 at 10:23 PM  [facility]   From 7 PM-7 AM, contact night-coverage [email or web address]  CC: Primary care physician; [facility] [place]

## 2022-02-07 NOTE — ED Notes (Signed)
Lab reports troponin results; acuity level changed; charge nurse notified and will take pt to next available exam room

## 2022-02-07 NOTE — ED Provider Notes (Signed)
[facility] Provider Note    Event Date/Time   First MD Initiated Contact with Patient 02/07/22 2032     (approximate)   History   Shortness of Breath and Chest Pain   HPI  Kelsey Klein is a 51 y.o. female who presents to the emergency department today because of concerns for episode of chest pain and shortness of breath.  Patient was at work when this started.  Chest pain was in the left chest and radiated down her left arm.  It lasted for about 30 minutes.  Shortness of breath lasted slightly longer.  At the time of my exam the patient is pain free.  She states that she had a hospitalization a few months ago for similar symptoms.  Today's episode felt worse tomorrow.  She has not undergone catheterization.      Physical Exam   Triage Vital Signs: ED Triage Vitals  Enc Vitals Group     BP 02/07/22 1837 113/72     Pulse Rate 02/07/22 1837 69     Resp 02/07/22 1837 17     Temp 02/07/22 1837 98.3 F (36.8 C)     Temp Source 02/07/22 1837 Oral     SpO2 02/07/22 1837 97 %     Weight 02/07/22 1839 154 lb (69.9 kg)     Height 02/07/22 1839 '5\' 3"'$  (1.6 m)     Head Circumference --      Peak Flow --      Pain Score 02/07/22 1839 6     Pain Loc --      Pain Edu? --      Excl. in [place]? --     Most recent vital signs: Vitals:   02/07/22 1837 02/07/22 2030  BP: 113/72 119/77  Pulse: 69 71  Resp: 17 (!) 23  Temp: 98.3 F (36.8 C)   SpO2: 97% 100%    General: Awake, alert, oriented. CV:  Good peripheral perfusion. Regular rate and rhythm. Resp:  Normal effort. Lungs clear. Abd:  No distention.     ED Results / Procedures / Treatments   Labs (all labs ordered are listed, but only abnormal results are displayed) Labs Reviewed  COMPREHENSIVE METABOLIC PANEL - Abnormal; Notable for the following components:      Result Value   Glucose, Bld 106 (*)    Creatinine, Ser 1.11 (*)    Calcium 8.8 (*)    AST 43 (*)    All other components within  normal limits  TROPONIN I (HIGH SENSITIVITY) - Abnormal; Notable for the following components:   Troponin I (High Sensitivity) 1,188 (*)    All other components within normal limits  CBC WITH DIFFERENTIAL/PLATELET  TROPONIN I (HIGH SENSITIVITY)     EKG  I, Nance Pear, attending physician, personally viewed and interpreted this EKG  EKG Time: 1904 Rate: 52 Rhythm: normal sinus rhythm Axis: normal Intervals: qtc 390 QRS: narrow ST changes: no st elevation Impression: abnormal ekg  RADIOLOGY I independently interpreted and visualized the CXR. My interpretation: No pneumonia Radiology interpretation:  IMPRESSION:  No active cardiopulmonary disease.      PROCEDURES:  Critical Care performed: Yes, see critical care procedure note(s)  Procedures  CRITICAL CARE Performed by: Nance Pear   Total critical care time: 25 minutes  Critical care time was exclusive of separately billable procedures and treating other patients.  Critical care was necessary to treat or prevent imminent or life-threatening deterioration.  Critical care was time spent  personally by me on the following activities: development of treatment plan with patient and/or surrogate as well as nursing, discussions with consultants, evaluation of patient's response to treatment, examination of patient, obtaining history from patient or surrogate, ordering and performing treatments and interventions, ordering and review of laboratory studies, ordering and review of radiographic studies, pulse oximetry and re-evaluation of patient's condition.   MEDICATIONS ORDERED IN ED: Medications  aspirin chewable tablet 324 mg (has no administration in time range)     IMPRESSION / MDM / ASSESSMENT AND PLAN / ED COURSE  I reviewed the triage vital signs and the nursing notes.                              Differential diagnosis includes, but is not limited to, ACS, pneumonia, pneumothorax.  Patient's  presentation is most consistent with acute presentation with potential threat to life or bodily function.  Patient presents to the emergency department today via because of an episode of chest pain and shortness of breath.  Had recent hospitalization for NSTEMI.  EKG without any ST elevation here in the emergency department.  Patient is pain-free at the time my exam.  Troponin was elevated at 1100.  Discussed this with the patient.  Will start patient on heparin drip.  Discussed with Dr. Sidney Ace with the hospitalist service who will plan on admission.   FINAL CLINICAL IMPRESSION(S) / ED DIAGNOSES   Final diagnoses:  Chest pain, unspecified type  NSTEMI (non-ST elevated myocardial infarction) [facility])       Note:  This document was prepared using Dragon voice recognition software and may include unintentional dictation errors.    Nance Pear, MD 02/07/22 2104

## 2022-02-07 NOTE — Consult Note (Signed)
ANTICOAGULATION CONSULT NOTE - Follow Up Consult  Pharmacy Consult for Heparin gtt Indication:  ACS / STEMI  No Known Allergies  Patient Measurements: Height: '5\' 3"'$  (160 cm) Weight: 69.9 kg (154 lb) IBW/kg (Calculated) : 52.4 Heparin Dosing Weight: 66.8kg  Vital Signs: Temp: 98.3 F (36.8 C) (08/24 1837) Temp Source: Oral (08/24 1837) BP: 119/77 (08/24 2030) Pulse Rate: 71 (08/24 2030)  Labs: Recent Labs    02/07/22 1851  HGB 14.3  HCT 42.7  PLT 170  CREATININE 1.11*  TROPONINIHS 1,188*   Estimated Creatinine Clearance: 56.9 mL/min (A) (by C-G formula based on SCr of 1.11 mg/dL (H)).  Medications: Heparin Dosing Weight: 66.8kg PTA: no AC or APT PTA. NKDA. Inpatient: +heparin gtt  Assessment: 51yo F w/ h/o breast cancer s/p resection and reconstructive surgery in 2019 and NSTEMI 10/30/2021 who presents to ED with c/o of SOB, CP, pt states CP is radiating down left arm. Pharmacy consulted for heparin gtt.  Date Time aPTT/HL Rate/Comment       Baseline Labs: aPTT - pending; INR - pending Hgb - 14.3; Plts - 170  Goal of Therapy:  Heparin level 0.3-0.7 units/ml Monitor platelets by anticoagulation protocol: Yes   Plan:  Previously therapeutic at 850 un/hr on recent prior admission. Give 4000 units bolus x1; then start heparin infusion at 850 units/hr Check anti-Xa level in 6 hours and daily once consecutively therapeutic. Continue to monitor H&H and platelets daily while on heparin gtt.  Shanon Brow Rod Majerus 02/07/2022,8:37 PM

## 2022-02-07 NOTE — Assessment & Plan Note (Addendum)
Cardiology consulted. S/p left heart cath 8/25 with DES x 1 placed. Echo -- EF 55%, no LV regional WMA's, mild MR. Continue DAPT with ASA/Plavix x 1 year, then per cardiology Continue statin, beta-blocker SL nitroglycerin PRN Follow up with cardiology as scheduled.

## 2022-02-07 NOTE — ED Notes (Signed)
Pt asked to use restroom for BM before EKG and blood work

## 2022-02-08 ENCOUNTER — Other Ambulatory Visit: Payer: Self-pay

## 2022-02-08 ENCOUNTER — Inpatient Hospital Stay (HOSPITAL_COMMUNITY)
Admit: 2022-02-08 | Discharge: 2022-02-08 | Disposition: A | Discharge status: Home / Self Care | Payer: Medicaid Other | Attending: Cardiovascular Disease | Admitting: Cardiovascular Disease

## 2022-02-08 ENCOUNTER — Encounter
Admission: EM | Disposition: A | Discharge status: Home / Self Care | Payer: Self-pay | Source: Home / Self Care | Attending: Family Medicine

## 2022-02-08 DIAGNOSIS — R079 Chest pain, unspecified: Secondary | ICD-10-CM

## 2022-02-08 DIAGNOSIS — C50512 Malignant neoplasm of lower-outer quadrant of left female breast: Secondary | ICD-10-CM | POA: Diagnosis not present

## 2022-02-08 DIAGNOSIS — I2510 Atherosclerotic heart disease of native coronary artery without angina pectoris: Secondary | ICD-10-CM | POA: Diagnosis not present

## 2022-02-08 DIAGNOSIS — I214 Non-ST elevation (NSTEMI) myocardial infarction: Secondary | ICD-10-CM

## 2022-02-08 HISTORY — PX: LEFT HEART CATH AND CORONARY ANGIOGRAPHY: CATH118249

## 2022-02-08 HISTORY — PX: CORONARY STENT INTERVENTION: CATH118234

## 2022-02-08 LAB — HEPARIN LEVEL (UNFRACTIONATED)
Heparin Unfractionated: 0.52 IU/mL (ref 0.30–0.70)
Heparin Unfractionated: 0.54 IU/mL (ref 0.30–0.70)

## 2022-02-08 LAB — CBC
HCT: 37.9 % (ref 36.0–46.0)
Hemoglobin: 12.7 g/dL (ref 12.0–15.0)
MCH: 29.8 pg (ref 26.0–34.0)
MCHC: 33.5 g/dL (ref 30.0–36.0)
MCV: 89 fL (ref 80.0–100.0)
Platelets: 176 10*3/uL (ref 150–400)
RBC: 4.26 MIL/uL (ref 3.87–5.11)
RDW: 13.5 % (ref 11.5–15.5)
WBC: 5.9 10*3/uL (ref 4.0–10.5)
nRBC: 0 % (ref 0.0–0.2)

## 2022-02-08 LAB — ECHOCARDIOGRAM LIMITED
Height: 63 in
S' Lateral: 2.53 cm
Weight: 2464 oz

## 2022-02-08 SURGERY — LEFT HEART CATH AND CORONARY ANGIOGRAPHY
Anesthesia: Moderate Sedation

## 2022-02-08 MED ORDER — FENTANYL CITRATE (PF) 100 MCG/2ML IJ SOLN
INTRAMUSCULAR | Status: DC | PRN
  Administered 2022-02-08 (×2): 25 ug via INTRAVENOUS

## 2022-02-08 MED ORDER — LIDOCAINE HCL (PF) 1 % IJ SOLN
INTRAMUSCULAR | Status: DC | PRN
  Administered 2022-02-08: 2 mL

## 2022-02-08 MED ORDER — TICAGRELOR 90 MG PO TABS
ORAL_TABLET | ORAL | Status: AC
  Filled 2022-02-08: qty 2

## 2022-02-08 MED ORDER — TICAGRELOR 90 MG PO TABS
ORAL_TABLET | ORAL | Status: DC | PRN
  Administered 2022-02-08: 180 mg via ORAL

## 2022-02-08 MED ORDER — HEPARIN (PORCINE) IN NACL 1000-0.9 UT/500ML-% IV SOLN
INTRAVENOUS | Status: AC
  Filled 2022-02-08: qty 1000

## 2022-02-08 MED ORDER — FENTANYL CITRATE (PF) 100 MCG/2ML IJ SOLN
INTRAMUSCULAR | Status: AC
  Filled 2022-02-08: qty 2

## 2022-02-08 MED ORDER — HEPARIN SODIUM (PORCINE) 1000 UNIT/ML IJ SOLN
INTRAMUSCULAR | Status: DC | PRN
  Administered 2022-02-08 (×2): 3500 [IU] via INTRAVENOUS

## 2022-02-08 MED ORDER — VERAPAMIL HCL 2.5 MG/ML IV SOLN
INTRAVENOUS | Status: AC
  Filled 2022-02-08: qty 2

## 2022-02-08 MED ORDER — SODIUM CHLORIDE 0.9% FLUSH: 3.0000 mL | INTRAVENOUS | Status: DC | PRN

## 2022-02-08 MED ORDER — SODIUM CHLORIDE 0.9% FLUSH: 3.0000 mL | Freq: Two times a day (BID) | INTRAVENOUS | Status: DC

## 2022-02-08 MED ORDER — SODIUM CHLORIDE 0.9 % IV SOLN: 250.0000 mL | INTRAVENOUS | Status: DC | PRN

## 2022-02-08 MED ORDER — ACETAMINOPHEN 325 MG PO TABS
ORAL_TABLET | ORAL | Status: AC
  Filled 2022-02-08: qty 2

## 2022-02-08 MED ORDER — SODIUM CHLORIDE 0.9 % IV SOLN: INTRAVENOUS | Status: AC

## 2022-02-08 MED ORDER — NITROGLYCERIN 1 MG/10 ML FOR IR/CATH LAB
INTRA_ARTERIAL | Status: AC
  Filled 2022-02-08: qty 10

## 2022-02-08 MED ORDER — ENOXAPARIN SODIUM 40 MG/0.4ML IJ SOSY
40.0000 mg | PREFILLED_SYRINGE | INTRAMUSCULAR | Status: DC
  Filled 2022-02-08 (×2): qty 0.4

## 2022-02-08 MED ORDER — HEPARIN (PORCINE) IN NACL 1000-0.9 UT/500ML-% IV SOLN
INTRAVENOUS | Status: DC | PRN
  Administered 2022-02-08 (×2): 500 mL

## 2022-02-08 MED ORDER — MIDAZOLAM HCL 2 MG/2ML IJ SOLN
INTRAMUSCULAR | Status: AC
  Filled 2022-02-08: qty 2

## 2022-02-08 MED ORDER — MIDAZOLAM HCL 2 MG/2ML IJ SOLN
INTRAMUSCULAR | Status: DC | PRN
  Administered 2022-02-08 (×2): 1 mg via INTRAVENOUS

## 2022-02-08 MED ORDER — IOHEXOL 300 MG/ML  SOLN
INTRAMUSCULAR | Status: DC | PRN
  Administered 2022-02-08: 77 mL

## 2022-02-08 MED ORDER — TICAGRELOR 90 MG PO TABS
90.0000 mg | ORAL_TABLET | Freq: Two times a day (BID) | ORAL | Status: DC
  Administered 2022-02-08 – 2022-02-09 (×2): 90 mg via ORAL
  Filled 2022-02-08 (×2): qty 1

## 2022-02-08 MED ORDER — VERAPAMIL HCL 2.5 MG/ML IV SOLN
INTRAVENOUS | Status: DC | PRN
  Administered 2022-02-08 (×2): 2.5 mg via INTRA_ARTERIAL

## 2022-02-08 MED ORDER — LIDOCAINE HCL 1 % IJ SOLN
INTRAMUSCULAR | Status: AC
  Filled 2022-02-08: qty 20

## 2022-02-08 MED ORDER — SODIUM CHLORIDE 0.9 % WEIGHT BASED INFUSION
1.0000 mL/kg/h | INTRAVENOUS | Status: DC
  Administered 2022-02-08: 1 mL/kg/h via INTRAVENOUS

## 2022-02-08 MED ORDER — HYDRALAZINE HCL 20 MG/ML IJ SOLN: 10.0000 mg | INTRAMUSCULAR | Status: AC | PRN

## 2022-02-08 MED ORDER — SODIUM CHLORIDE 0.9% FLUSH
3.0000 mL | Freq: Two times a day (BID) | INTRAVENOUS | Status: DC
  Administered 2022-02-08 – 2022-02-09 (×2): 3 mL via INTRAVENOUS

## 2022-02-08 MED ORDER — HEPARIN SODIUM (PORCINE) 1000 UNIT/ML IJ SOLN
INTRAMUSCULAR | Status: AC
  Filled 2022-02-08: qty 10

## 2022-02-08 MED ORDER — NITROGLYCERIN 1 MG/10 ML FOR IR/CATH LAB
INTRA_ARTERIAL | Status: DC | PRN
  Administered 2022-02-08: 200 ug via INTRACORONARY

## 2022-02-08 MED ORDER — SODIUM CHLORIDE 0.9 % WEIGHT BASED INFUSION
3.0000 mL/kg/h | INTRAVENOUS | Status: DC
  Administered 2022-02-08: 3 mL/kg/h via INTRAVENOUS

## 2022-02-08 MED ORDER — LABETALOL HCL 5 MG/ML IV SOLN
10.0000 mg | INTRAVENOUS | Status: AC | PRN
Start: 2022-02-08 — End: 2022-02-08

## 2022-02-08 SURGICAL SUPPLY — 21 items
BALLN MINITREK RX 2.0X12 (BALLOONS) ×1
BALLN ~~LOC~~ TREK NEO RX 2.25X15 (BALLOONS) ×1
BALLOON MINITREK RX 2.0X12 (BALLOONS) IMPLANT
BALLOON ~~LOC~~ TREK NEO RX 2.25X15 (BALLOONS) IMPLANT
BAND ZEPHYR COMPRESS 30 LONG (HEMOSTASIS) IMPLANT
CATH 5FR JL3.5 JR4 ANG PIG MP (CATHETERS) IMPLANT
CATH LAUNCHER 5F EBU3.0 (CATHETERS) IMPLANT
CATHETER LAUNCHER 5F EBU3.0 (CATHETERS) ×1
DRAPE BRACHIAL (DRAPES) IMPLANT
GLIDESHEATH SLEND A-KIT 6F 22G (SHEATH) IMPLANT
GUIDEWIRE INQWIRE 1.5J.035X260 (WIRE) IMPLANT
INQWIRE 1.5J .035X260CM (WIRE) ×1
KIT ENCORE 26 ADVANTAGE (KITS) IMPLANT
PACK CARDIAC CATH (CUSTOM PROCEDURE TRAY) ×1 IMPLANT
PROTECTION STATION PRESSURIZED (MISCELLANEOUS) ×1
SET ATX SIMPLICITY (MISCELLANEOUS) IMPLANT
STATION PROTECTION PRESSURIZED (MISCELLANEOUS) IMPLANT
STENT ONYX FRONTIER 2.25X18 (Permanent Stent) IMPLANT
TUBING CIL FLEX 10 FLL-RA (TUBING) IMPLANT
WIRE HITORQ VERSACORE ST 145CM (WIRE) IMPLANT
WIRE RUNTHROUGH .014X180CM (WIRE) IMPLANT

## 2022-02-08 NOTE — Hospital Course (Signed)
Kelsey Klein is a 51 y.o. African American female with medical history significant for breast cancer status postmastectomy and sentinel node biopsy followed by breast reconstruction in March 2019, on oral chemotherapy, presented to the ER on 02/07/2022 for evaluation of mid-sternal chest pain felt as pressure and graded 7/10 in severity with associated radiation to her left arm.  She had nausea and vomiting 2 hours after her chest pain.  She admitted to associated dyspnea.  Her chest pain lasted about 30 minutes and her dyspnea was longer.   In the ED, vitals were normal.  Labs notable for hs-troponin elevated at 1188 and later 1010.  EKG bradycardia at 52 bpm, T inversions anterolaterally and inferiorly.  Cardiology was consulted. Patient started on heparin infusion for ACS.  Cardiology took patient to cath lab on 8/25, found to have  95% mid/distal LAD stenosis.  A single drug-eluting stent was placed.

## 2022-02-08 NOTE — Progress Notes (Signed)
Pt eating sandwich tray and drinking fluids

## 2022-02-08 NOTE — Consult Note (Signed)
Cardiology Consultation:   Patient ID: [name]; [id number]; [birth year]   Admit date: 02/07/2022 Date of Consult: 02/08/2022  Primary Care Provider: [facility] Primary Cardiologist: End Primary Electrophysiologist:  None   Patient Profile:   Kelsey Klein is a 51 y.o. female with a hx of breast cancer status post bilateral mastectomy who is being seen today for the evaluation of NSTEMI at the request of Dr. Sidney Ace.  History of Present Illness:   Kelsey Klein was admitted to [facility] on 10/30/2021 through 10/31/2021 with 3 separate episodes of chest pressure associated with shortness of breath, flushing, and sweating as well as 1 episode of right upper extremity discomfort.  Symptoms were nonexertional and would typically last for 10 minutes.  High-sensitivity troponin peaked at 224 with subsequent downtrend.  CTA of the chest was negative for PE.  Echo showed an EF of 65 to 70%, no regional wall motion abnormalities, mild LVH, normal LV diastolic function parameters, normal RV systolic function, ventricular cavity size, and PASP, and trivial mitral regurgitation.  Lexiscan MPI showed no evidence of ischemia with a normal LVEF.  No significant coronary artery calcification was noted.  Overall, this was a low risk study.  Around 12:30 PM on 02/07/2022, she developed intense chest pain with associated dyspnea and nausea/vomiting that lasted for around 30 minutes and resolved without intervention. Around 5:30 PM on 8/24, she had return of dyspnea without pain. Symptoms felt similar to what she experienced in 10/2021, though were more intense.  Vitals stable.  Initial high-sensitivity troponin 1188 trending to 1010.  EKG showed sinus bradycardia, 52 bpm, and anterior T wave inversion more pronounced when compared to prior.  Chest x-ray showed no active cardiopulmonary disease.  In the ED, she was given aspirin 324 mg x 1 and started on a heparin drip.  Currently, chest pain  free and without dyspnea.    Past Medical History:  Diagnosis Date   Breast cancer, stage 1, estrogen receptor negative, right ([place]) 08/19/2015    18 mm; pT1c  pN0 (0/6nodes positive). ER/PR +; Her 2 neu not overexpressed   Family history of breast cancer     Past Surgical History:  Procedure Laterality Date   BREAST BIOPSY Left 05/27/2017   U/S core Bx- invasive mammary ER/PR positive Her2 negative   BREAST BIOPSY Left 05/27/2017   Affirm Bx- DCIS   BREAST RECONSTRUCTION WITH PLACEMENT OF TISSUE EXPANDER AND FLEX HD (ACELLULAR HYDRATED DERMIS) Bilateral 08/18/2017   Procedure: BREAST RECONSTRUCTION WITH PLACEMENT OF TISSUE EXPANDER AND FLEX HD (ACELLULAR HYDRATED DERMIS);  Surgeon: Wallace Going, DO;  Location: [facility];  Service: Plastics;  Laterality: Bilateral;   LIPOSUCTION WITH LIPOFILLING Bilateral 05/21/2018   Procedure: liposuction with fat grafting and revolve;  Surgeon: Wallace Going, DO;  Location: [place];  Service: Plastics;  Laterality: Bilateral;   LIPOSUCTION WITH LIPOFILLING Bilateral 02/23/2020   Procedure: Fat filling to bilateral breasts for symmetry;  Surgeon: Wallace Going, DO;  Location: [place];  Service: Plastics;  Laterality: Bilateral;   MASTECTOMY W/ SENTINEL NODE BIOPSY Bilateral 08/18/2017   Procedure: MASTECTOMY WITH SENTINEL LYMPH NODE BIOPSY,FROZEN SECTION;  Surgeon: Robert Bellow, MD;  Location: [facility];  Service: General;  Laterality: Bilateral;   REMOVAL OF TISSUE EXPANDER AND PLACEMENT OF IMPLANT Bilateral 12/10/2017   Procedure: REMOVAL OF TISSUE EXPANDERS AND PLACEMENT OF IMPLANTS;  Surgeon: Wallace Going, DO;  Location: [place];  Service: Plastics;  Laterality: Bilateral;     Home Meds: Prior to Admission medications   Medication Sig Start Date End Date Taking? Authorizing Provider  Adapalene 0.3 % gel SMARTSIG:sparingly Topical Every Night 01/02/22  Yes [provider]  anastrozole (ARIMIDEX) 1 MG tablet Take 1 tablet (1 mg total) by mouth daily. Patient not taking: Reported on 02/07/2022 01/29/21   Jacquelin Hawking, NP    Inpatient Medications: Scheduled Meds:  aspirin EC  81 mg Oral Daily   atorvastatin  80 mg Oral Daily   metoprolol tartrate  12.5 mg Oral BID   Continuous Infusions:  sodium chloride 100 mL/hr at 02/07/22 2156   heparin 850 Units/hr (02/07/22 2120)   PRN Meds: acetaminophen, ALPRAZolam, magnesium hydroxide, morphine injection, nitroGLYCERIN, ondansetron (ZOFRAN) IV, traZODone  Allergies:  No Known Allergies  Social History:   Social History   Socioeconomic History   Marital status: Single    Spouse name: Not on file   Number of children: Not on file   Years of education: Not on file   Highest education level: Not on file  Occupational History   Not on file  Tobacco Use   Smoking status: Never   Smokeless tobacco: Never  Vaping Use   Vaping Use: Never used  Substance and Sexual Activity   Alcohol use: Not Currently   Drug use: No   Sexual activity: Yes    Birth control/protection: Condom  Other Topics Concern   Not on file  Social History Narrative   Not on file   Social Determinants of Health   Financial Resource Strain: Not on file  Food Insecurity: Not on file  Transportation Needs: Not on file  Physical Activity: Not on file  Stress: Not on file  Social Connections: Not on file  Intimate Partner Violence: Not on file     Family History:   Family History  Problem Relation Age of Onset   Arthritis Mother    Cirrhosis Father        deceased 79; adopted   Hypertension Father    Hypertension Sister    Alzheimer's disease Maternal Grandmother    Breast cancer Paternal Aunt        age at dx unknown   Breast cancer Paternal Aunt        age at dx unknown    ROS:  Review of Systems  Constitutional:  Negative for chills, diaphoresis, fever, malaise/fatigue and weight loss.  HENT:   Negative for congestion.   Eyes:  Negative for discharge and redness.  Respiratory:  Positive for shortness of breath. Negative for cough, sputum production and wheezing.   Cardiovascular:  Positive for chest pain. Negative for palpitations, orthopnea, claudication, leg swelling and PND.  Gastrointestinal:  Negative for abdominal pain, heartburn, nausea and vomiting.  Musculoskeletal:  Negative for falls and myalgias.  Skin:  Negative for rash.  Neurological:  Negative for dizziness, tingling, tremors, sensory change, speech change, focal weakness, loss of consciousness and weakness.  Endo/Heme/Allergies:  Does not bruise/bleed easily.  Psychiatric/Behavioral:  Negative for substance abuse. The patient is not nervous/anxious.   All other systems reviewed and are negative.     Physical Exam/Data:   Vitals:   02/08/22 0400 02/08/22 0432 02/08/22 0500 02/08/22 0600  BP: 104/77  114/73 115/83  Pulse: 69  60 67  Resp: 15  17 17   Temp:  97.9 F (36.6 C)    TempSrc:  Oral    SpO2: 97%  98% 98%  Weight:  Height:        Intake/Output Summary (Last 24 hours) at 02/08/2022 0746 Last data filed at 02/08/2022 0657 Gross per 24 hour  Intake 978.51 ml  Output --  Net 978.51 ml   Filed Weights   02/07/22 1839  Weight: 69.9 kg   Body mass index is 27.28 kg/m.   Physical Exam: General: Well developed, well nourished, in no acute distress. Head: Normocephalic, atraumatic, sclera non-icteric, no xanthomas, nares without discharge.  Neck: Negative for carotid bruits. JVD not elevated. Lungs: Clear bilaterally to auscultation without wheezes, rales, or rhonchi. Breathing is unlabored. Heart: RRR with S1 S2. No murmurs, rubs, or gallops appreciated. Abdomen: Soft, non-tender, non-distended with normoactive bowel sounds. No hepatomegaly. No rebound/guarding. No obvious abdominal masses. Msk:  Strength and tone appear normal for age. Extremities: No clubbing or cyanosis. No edema. Distal  pedal pulses are 2+ and equal bilaterally. Neuro: Alert and oriented X 3. No facial asymmetry. No focal deficit. Moves all extremities spontaneously. Psych:  Responds to questions appropriately with a normal affect.   EKG:  The EKG was personally reviewed and demonstrates: sinus bradycardia, 52 bpm, and anterior T wave inversion more pronounced when compared to prior Telemetry:  Telemetry was personally reviewed and demonstrates: SR with rare PVCs, rare ventricular couplet  Weights: Filed Weights   02/07/22 1839  Weight: 69.9 kg    Relevant CV Studies:  Lexiscan MPI 10/31/2021:   The study is normal.. The study is low risk.   No ST deviation was noted.   LV perfusion is normal.   Left ventricular function is normal. calculated LVEF is 73%.   No significant coronary artery calcifications noted. __________  2D echo 10/30/2021: 1. Left ventricular ejection fraction, by estimation, is 65 to 70%. The  left ventricle has normal function. The left ventricle has no regional  wall motion abnormalities. There is mild left ventricular hypertrophy.  Left ventricular diastolic parameters  were normal.   2. Right ventricular systolic function is normal. The right ventricular  size is normal. There is normal pulmonary artery systolic pressure.   3. The mitral valve is normal in structure. Trivial mitral valve  regurgitation. No evidence of mitral stenosis.   4. The aortic valve is tricuspid. Aortic valve regurgitation is not  visualized. No aortic stenosis is present.   5. The inferior vena cava is normal in size with <50% respiratory  variability, suggesting right atrial pressure of 8 mmHg.  Laboratory Data:  Chemistry Recent Labs  Lab 02/07/22 1851  NA 140  K 4.2  CL 106  CO2 25  GLUCOSE 106*  BUN 18  CREATININE 1.11*  CALCIUM 8.8*  GFRNONAA >60  ANIONGAP 9    Recent Labs  Lab 02/07/22 1851  PROT 7.0  ALBUMIN 4.1  AST 43*  ALT 17  ALKPHOS 45  BILITOT 0.8    Hematology Recent Labs  Lab 02/07/22 1851 02/08/22 0427  WBC 7.2 5.9  RBC 4.69 4.26  HGB 14.3 12.7  HCT 42.7 37.9  MCV 91.0 89.0  MCH 30.5 29.8  MCHC 33.5 33.5  RDW 13.3 13.5  PLT 170 176   Cardiac EnzymesNo results for input(s): "TROPONINI" in the last 168 hours. No results for input(s): "TROPIPOC" in the last 168 hours.  BNPNo results for input(s): "BNP", "PROBNP" in the last 168 hours.  DDimer No results for input(s): "DDIMER" in the last 168 hours.  Radiology/Studies:  DG Chest 2 View  Result Date: 02/07/2022 IMPRESSION: No active cardiopulmonary disease. Electronically Signed  By: Anner Crete M.D.   On: 02/07/2022 19:29    Assessment and Plan:   1. NSTEMI: -Currently, chest pain free -High-sensitivity troponin peaked at 1188 and is currently down trending -NPO -Plan for LHC today at 12:30 PM with Dr. Saunders Revel -Echo -ASA -Heparin gtt -She was started on atorvastatin 80 mg and Lopressor 12.5 mg twice daily upon admission -LP(a) 68.5 -LDL 68, A1c 5.8  2. AKI: -Gentle IV hydration   Shared Decision Making/Informed Consent{  The risks [stroke (1 in 1000), death (1 in 37), kidney failure [usually temporary] (1 in 500), bleeding (1 in 200), allergic reaction [possibly serious] (1 in 200)], benefits (diagnostic support and management of coronary artery disease) and alternatives of a cardiac catheterization were discussed in detail with Kelsey Klein and she is willing to proceed.    For questions or updates, please contact [place] Please consult [email or web address] for contact info under Cardiology/STEMI.   Signed, Christell Faith, PA-C [place] Pager: [phone] 02/08/2022, 7:46 AM

## 2022-02-08 NOTE — H&P (View-Only) (Signed)
Cardiology Consultation:   Patient ID: Kelsey Klein; [id number]; Nov 03, 1970   Admit date: 02/07/2022 Date of Consult: 02/08/2022  Primary Care Provider: [facility] [place] Primary Cardiologist: End Primary Electrophysiologist:  None   Patient Profile:   Kelsey Klein is a 51 y.o. female with a hx of breast cancer status post bilateral mastectomy who is being seen today for the evaluation of NSTEMI at the request of Dr. Sidney Ace.  History of Present Illness:   Kelsey Klein was admitted to [facility] on 10/30/2021 through 10/31/2021 with 3 separate episodes of chest pressure associated with shortness of breath, flushing, and sweating as well as 1 episode of right upper extremity discomfort.  Symptoms were nonexertional and would typically last for 10 minutes.  High-sensitivity troponin peaked at 224 with subsequent downtrend.  CTA of the chest was negative for PE.  Echo showed an EF of 65 to 70%, no regional wall motion abnormalities, mild LVH, normal LV diastolic function parameters, normal RV systolic function, ventricular cavity size, and PASP, and trivial mitral regurgitation.  Lexiscan MPI showed no evidence of ischemia with a normal LVEF.  No significant coronary artery calcification was noted.  Overall, this was a low risk study.  Around 12:30 PM on 02/07/2022, she developed intense chest pain with associated dyspnea and nausea/vomiting that lasted for around 30 minutes and resolved without intervention. Around 5:30 PM on 8/24, she had return of dyspnea without pain. Symptoms felt similar to what she experienced in 10/2021, though were more intense.  Vitals stable.  Initial high-sensitivity troponin 1188 trending to 1010.  EKG showed sinus bradycardia, 52 bpm, and anterior T wave inversion more pronounced when compared to prior.  Chest x-ray showed no active cardiopulmonary disease.  In the ED, she was given aspirin 324 mg x 1 and started on a heparin drip.  Currently, chest pain  free and without dyspnea.    Past Medical History:  Diagnosis Date   Breast cancer, stage 1, estrogen receptor negative, right ([place]) 08/19/2015    18 mm; pT1c  pN0 (0/6nodes positive). ER/PR +; Her 2 neu not overexpressed   Family history of breast cancer     Past Surgical History:  Procedure Laterality Date   BREAST BIOPSY Left 05/27/2017   U/S core Bx- invasive mammary ER/PR positive Her2 negative   BREAST BIOPSY Left 05/27/2017   Affirm Bx- DCIS   BREAST RECONSTRUCTION WITH PLACEMENT OF TISSUE EXPANDER AND FLEX HD (ACELLULAR HYDRATED DERMIS) Bilateral 08/18/2017   Procedure: BREAST RECONSTRUCTION WITH PLACEMENT OF TISSUE EXPANDER AND FLEX HD (ACELLULAR HYDRATED DERMIS);  Surgeon: Wallace Going, DO;  Location: [facility];  Service: Plastics;  Laterality: Bilateral;   LIPOSUCTION WITH LIPOFILLING Bilateral 05/21/2018   Procedure: liposuction with fat grafting and revolve;  Surgeon: Wallace Going, DO;  Location: [place];  Service: Plastics;  Laterality: Bilateral;   LIPOSUCTION WITH LIPOFILLING Bilateral 02/23/2020   Procedure: Fat filling to bilateral breasts for symmetry;  Surgeon: Wallace Going, DO;  Location: [place];  Service: Plastics;  Laterality: Bilateral;   MASTECTOMY W/ SENTINEL NODE BIOPSY Bilateral 08/18/2017   Procedure: MASTECTOMY WITH SENTINEL LYMPH NODE BIOPSY,FROZEN SECTION;  Surgeon: Robert Bellow, MD;  Location: [facility];  Service: General;  Laterality: Bilateral;   REMOVAL OF TISSUE EXPANDER AND PLACEMENT OF IMPLANT Bilateral 12/10/2017   Procedure: REMOVAL OF TISSUE EXPANDERS AND PLACEMENT OF IMPLANTS;  Surgeon: Wallace Going, DO;  Location: [place];  Service: Plastics;  Laterality: Bilateral;     Home Meds: Prior to Admission medications   Medication Sig Start Date End Date Taking? Authorizing Provider  Adapalene 0.3 % gel SMARTSIG:sparingly Topical Every Night 01/02/22  Yes [provider]  anastrozole (ARIMIDEX) 1 MG tablet Take 1 tablet (1 mg total) by mouth daily. Patient not taking: Reported on 02/07/2022 01/29/21   Jacquelin Hawking, NP    Inpatient Medications: Scheduled Meds:  aspirin EC  81 mg Oral Daily   atorvastatin  80 mg Oral Daily   metoprolol tartrate  12.5 mg Oral BID   Continuous Infusions:  sodium chloride 100 mL/hr at 02/07/22 2156   heparin 850 Units/hr (02/07/22 2120)   PRN Meds: acetaminophen, ALPRAZolam, magnesium hydroxide, morphine injection, nitroGLYCERIN, ondansetron (ZOFRAN) IV, traZODone  Allergies:  No Known Allergies  Social History:   Social History   Socioeconomic History   Marital status: Single    Spouse name: Not on file   Number of children: Not on file   Years of education: Not on file   Highest education level: Not on file  Occupational History   Not on file  Tobacco Use   Smoking status: Never   Smokeless tobacco: Never  Vaping Use   Vaping Use: Never used  Substance and Sexual Activity   Alcohol use: Not Currently   Drug use: No   Sexual activity: Yes    Birth control/protection: Condom  Other Topics Concern   Not on file  Social History Narrative   Not on file   Social Determinants of Health   Financial Resource Strain: Not on file  Food Insecurity: Not on file  Transportation Needs: Not on file  Physical Activity: Not on file  Stress: Not on file  Social Connections: Not on file  Intimate Partner Violence: Not on file     Family History:   Family History  Problem Relation Age of Onset   Arthritis Mother    Cirrhosis Father        deceased 21; adopted   Hypertension Father    Hypertension Sister    Alzheimer's disease Maternal Grandmother    Breast cancer Paternal Aunt        age at dx unknown   Breast cancer Paternal Aunt        age at dx unknown    ROS:  Review of Systems  Constitutional:  Negative for chills, diaphoresis, fever, malaise/fatigue and weight loss.  HENT:   Negative for congestion.   Eyes:  Negative for discharge and redness.  Respiratory:  Positive for shortness of breath. Negative for cough, sputum production and wheezing.   Cardiovascular:  Positive for chest pain. Negative for palpitations, orthopnea, claudication, leg swelling and PND.  Gastrointestinal:  Negative for abdominal pain, heartburn, nausea and vomiting.  Musculoskeletal:  Negative for falls and myalgias.  Skin:  Negative for rash.  Neurological:  Negative for dizziness, tingling, tremors, sensory change, speech change, focal weakness, loss of consciousness and weakness.  Endo/Heme/Allergies:  Does not bruise/bleed easily.  Psychiatric/Behavioral:  Negative for substance abuse. The patient is not nervous/anxious.   All other systems reviewed and are negative.     Physical Exam/Data:   Vitals:   02/08/22 0400 02/08/22 0432 02/08/22 0500 02/08/22 0600  BP: 104/77  114/73 115/83  Pulse: 69  60 67  Resp: 15  17 17   Temp:  97.9 F (36.6 C)    TempSrc:  Oral    SpO2: 97%  98% 98%  Weight:  Height:        Intake/Output Summary (Last 24 hours) at 02/08/2022 0746 Last data filed at 02/08/2022 0657 Gross per 24 hour  Intake 978.51 ml  Output --  Net 978.51 ml   Filed Weights   02/07/22 1839  Weight: 69.9 kg   Body mass index is 27.28 kg/m.   Physical Exam: General: Well developed, well nourished, in no acute distress. Head: Normocephalic, atraumatic, sclera non-icteric, no xanthomas, nares without discharge.  Neck: Negative for carotid bruits. JVD not elevated. Lungs: Clear bilaterally to auscultation without wheezes, rales, or rhonchi. Breathing is unlabored. Heart: RRR with S1 S2. No murmurs, rubs, or gallops appreciated. Abdomen: Soft, non-tender, non-distended with normoactive bowel sounds. No hepatomegaly. No rebound/guarding. No obvious abdominal masses. Msk:  Strength and tone appear normal for age. Extremities: No clubbing or cyanosis. No edema. Distal  pedal pulses are 2+ and equal bilaterally. Neuro: Alert and oriented X 3. No facial asymmetry. No focal deficit. Moves all extremities spontaneously. Psych:  Responds to questions appropriately with a normal affect.   EKG:  The EKG was personally reviewed and demonstrates: sinus bradycardia, 52 bpm, and anterior T wave inversion more pronounced when compared to prior Telemetry:  Telemetry was personally reviewed and demonstrates: SR with rare PVCs, rare ventricular couplet  Weights: Filed Weights   02/07/22 1839  Weight: 69.9 kg    Relevant CV Studies:  Lexiscan MPI 10/31/2021:   The study is normal.. The study is low risk.   No ST deviation was noted.   LV perfusion is normal.   Left ventricular function is normal. calculated LVEF is 73%.   No significant coronary artery calcifications noted. __________  2D echo 10/30/2021: 1. Left ventricular ejection fraction, by estimation, is 65 to 70%. The  left ventricle has normal function. The left ventricle has no regional  wall motion abnormalities. There is mild left ventricular hypertrophy.  Left ventricular diastolic parameters  were normal.   2. Right ventricular systolic function is normal. The right ventricular  size is normal. There is normal pulmonary artery systolic pressure.   3. The mitral valve is normal in structure. Trivial mitral valve  regurgitation. No evidence of mitral stenosis.   4. The aortic valve is tricuspid. Aortic valve regurgitation is not  visualized. No aortic stenosis is present.   5. The inferior vena cava is normal in size with <50% respiratory  variability, suggesting right atrial pressure of 8 mmHg.  Laboratory Data:  Chemistry Recent Labs  Lab 02/07/22 1851  NA 140  K 4.2  CL 106  CO2 25  GLUCOSE 106*  BUN 18  CREATININE 1.11*  CALCIUM 8.8*  GFRNONAA >60  ANIONGAP 9    Recent Labs  Lab 02/07/22 1851  PROT 7.0  ALBUMIN 4.1  AST 43*  ALT 17  ALKPHOS 45  BILITOT 0.8    Hematology Recent Labs  Lab 02/07/22 1851 02/08/22 0427  WBC 7.2 5.9  RBC 4.69 4.26  HGB 14.3 12.7  HCT 42.7 37.9  MCV 91.0 89.0  MCH 30.5 29.8  MCHC 33.5 33.5  RDW 13.3 13.5  PLT 170 176   Cardiac EnzymesNo results for input(s): "TROPONINI" in the last 168 hours. No results for input(s): "TROPIPOC" in the last 168 hours.  BNPNo results for input(s): "BNP", "PROBNP" in the last 168 hours.  DDimer No results for input(s): "DDIMER" in the last 168 hours.  Radiology/Studies:  DG Chest 2 View  Result Date: 02/07/2022 IMPRESSION: No active cardiopulmonary disease. Electronically Signed  By: Anner Crete M.D.   On: 02/07/2022 19:29    Assessment and Plan:   1. NSTEMI: -Currently, chest pain free -High-sensitivity troponin peaked at 1188 and is currently down trending -NPO -Plan for LHC today at 12:30 PM with Dr. Saunders Revel -Echo -ASA -Heparin gtt -She was started on atorvastatin 80 mg and Lopressor 12.5 mg twice daily upon admission -LP(a) 68.5 -LDL 68, A1c 5.8  2. AKI: -Gentle IV hydration   Shared Decision Making/Informed Consent{  The risks [stroke (1 in 1000), death (1 in 70), kidney failure [usually temporary] (1 in 500), bleeding (1 in 200), allergic reaction [possibly serious] (1 in 200)], benefits (diagnostic support and management of coronary artery disease) and alternatives of a cardiac catheterization were discussed in detail with Ms. Hiltunen and she is willing to proceed.    For questions or updates, please contact [place] Please consult [email or web address] for contact info under Cardiology/STEMI.   Signed, Christell Faith, PA-C [place] Pager: [phone] 02/08/2022, 7:46 AM

## 2022-02-08 NOTE — Interval H&P Note (Signed)
History and Physical Interval Note:  02/08/2022 2:34 PM  Kelsey Klein  has presented today for surgery, with the diagnosis of non ST segment myocardial infarction.  The various methods of treatment have been discussed with the patient and family. After consideration of risks, benefits and other options for treatment, the patient has consented to  Procedure(s): LEFT HEART CATH AND CORONARY ANGIOGRAPHY (N/A) as a surgical intervention.  The patient's history has been reviewed, patient examined, no change in status, stable for surgery.  I have reviewed the patient's chart and labs.  Questions were answered to the patient's satisfaction.    Cath Lab Visit (complete for each Cath Lab visit)  Clinical Evaluation Leading to the Procedure:   ACS: Yes.    Non-ACS:  N/A  Nicholus Chandran

## 2022-02-08 NOTE — Consult Note (Signed)
ANTICOAGULATION CONSULT NOTE - Follow Up Consult  Pharmacy Consult for Heparin gtt Indication:  ACS / STEMI  No Known Allergies  Patient Measurements: Height: '5\' 3"'$  (160 cm) Weight: 69.9 kg (154 lb) IBW/kg (Calculated) : 52.4 Heparin Dosing Weight: 66.8kg  Vital Signs: Temp: 98.2 F (36.8 C) (08/25 1000) Temp Source: Oral (08/25 1000) BP: 118/86 (08/25 1000) Pulse Rate: 55 (08/25 1000)  Labs: Recent Labs    02/07/22 1851 02/07/22 2039 02/07/22 2116 02/08/22 0427 02/08/22 1102  HGB 14.3  --   --  12.7  --   HCT 42.7  --   --  37.9  --   PLT 170  --   --  176  --   APTT  --   --  30  --   --   LABPROT  --   --  13.6  --   --   INR  --   --  1.1  --   --   HEPARINUNFRC  --   --   --  0.52 0.54  CREATININE 1.11*  --   --   --   --   TROPONINIHS 1,188* 1,010*  --   --   --     Estimated Creatinine Clearance: 56.9 mL/min (A) (by C-G formula based on SCr of 1.11 mg/dL (H)).  Medications: Heparin Dosing Weight: 66.8 kg PTA: no AC or APT PTA. NKDA. Inpatient: +heparin gtt  Assessment: Kelsey Klein is a 51 y.o. female with PMH significant for breast cancer s/p resection and reconstructive surgery in 2019 and NSTEMI 10/30/2021. Patient who presented to ED with complaints of SOB and CP radiating down left arm. Troponin on admission 1188 >> 1010. Chest X ray negative. PT to be evaluated with [facility] 8/25. Pharmacy consulted for heparin gtt.  Date Time aPTT/HL Rate/Comment 8/25    0427    HL 0.52           Therapeutic x1 8/25  1102 HL 0.54 Therapeutic x2  Baseline Labs: aPTT 30, INR 1.1, Hgb 14.3, Plts 170 8/25 Hgb - 12.7; Plts - 176  Goal of Therapy:  Heparin level 0.3-0.7 units/ml Monitor platelets by anticoagulation protocol: Yes   Plan:  Continue heparin infusion at 850 units/hr Check heparin levels daily since patient is therapeutic x2 on heparin gtt  Continue to monitor H&H and platelets daily while on heparin gtt.  Venora Maples, Pharm D Candidate   02/08/2022,11:56 AM

## 2022-02-08 NOTE — Consult Note (Signed)
ANTICOAGULATION CONSULT NOTE - Follow Up Consult  Pharmacy Consult for Heparin gtt Indication:  ACS / STEMI  No Known Allergies  Patient Measurements: Height: '5\' 3"'$  (160 cm) Weight: 69.9 kg (154 lb) IBW/kg (Calculated) : 52.4 Heparin Dosing Weight: 66.8kg  Vital Signs: Temp: 97.9 F (36.6 C) (08/25 0432) Temp Source: Oral (08/25 0432) BP: 104/77 (08/25 0400) Pulse Rate: 69 (08/25 0400)  Labs: Recent Labs    02/07/22 1851 02/07/22 2039 02/07/22 2116 02/08/22 0427  HGB 14.3  --   --  12.7  HCT 42.7  --   --  37.9  PLT 170  --   --  176  APTT  --   --  30  --   LABPROT  --   --  13.6  --   INR  --   --  1.1  --   HEPARINUNFRC  --   --   --  0.52  CREATININE 1.11*  --   --   --   TROPONINIHS 1,188* 1,010*  --   --     Estimated Creatinine Clearance: 56.9 mL/min (A) (by C-G formula based on SCr of 1.11 mg/dL (H)).  Medications: Heparin Dosing Weight: 66.8kg PTA: no AC or APT PTA. NKDA. Inpatient: +heparin gtt  Assessment: 51yo F w/ h/o breast cancer s/p resection and reconstructive surgery in 2019 and NSTEMI 10/30/2021 who presents to ED with c/o of SOB, CP, pt states CP is radiating down left arm. Pharmacy consulted for heparin gtt.  Date Time aPTT/HL Rate/Comment 8/25 0427 HL 0.52 Therapeutic x 1  Baseline Labs: aPTT - pending; INR - pending Hgb - 14.3; Plts - 170  Goal of Therapy:  Heparin level 0.3-0.7 units/ml Monitor platelets by anticoagulation protocol: Yes   Plan:  Continue heparin infusion at 850 units/hr Recheck HL in 6 hours to confirm, then daily once consecutively therapeutic. Continue to monitor H&H and platelets daily while on heparin gtt.  Renda Rolls, PharmD, [facility] 02/08/2022 5:23 AM

## 2022-02-08 NOTE — Progress Notes (Signed)
  Progress Note   Patient: Kelsey Klein IEP:[id number] DOB: [birth year] DOA: 02/07/2022     1 DOS: the patient was seen and examined on 02/08/2022   Brief hospital course: Kelsey Klein is a 51 y.o. African American female with medical history significant for breast cancer status postmastectomy and sentinel node biopsy followed by breast reconstruction in March 2019, on oral chemotherapy, presented to the ER on 02/07/2022 for evaluation of mid-sternal chest pain felt as pressure and graded 7/10 in severity with associated radiation to her left arm.  She had nausea and vomiting 2 hours after her chest pain.  She admitted to associated dyspnea.  Her chest pain lasted about 30 minutes and her dyspnea was longer.   In the ED, vitals were normal.  Labs notable for hs-troponin elevated at 1188 and later 1010.  EKG bradycardia at 52 bpm, T inversions anterolaterally and inferiorly.  Cardiology was consulted. Patient started on heparin infusion for ACS.  Cardiology took patient to cath lab on 8/25, found to have  95% mid/distal LAD stenosis.  A single drug-eluting stent was placed.    Assessment and Plan: * NSTEMI (non-ST elevated myocardial infarction) ([place]) S/p left heart cath 8/25 with DES x 1 placed. Echo showed EF 55%, no LV regional WMA's, mild MR. Cardiology following. Continue aspirin, statin, low-dose beta-blocker SL nitroglycerin PRN    Primary cancer of lower-outer quadrant of left breast (H[facility]) On Arimidex for history of breast cancer, held on admission. Pt is status post bilateral mastectomy and breast reconstruction in March 2019.   Initially on tamoxifen but it was discontinued due to the increasing depression and mood changes.  Transitioned to Arimidex since then and was expected to have a total of 5 years of therapy ending in March 2024 per Dr. Gary Fleet last visit on 08/14/2021.        Subjective: No chest pain at this time.  No other acute complaints.   Physical  Exam: Vitals:   02/08/22 1607 02/08/22 1612 02/08/22 1617 02/08/22 1622  BP: 111/82 114/82 127/89 127/85  Pulse: 63 61 62 (!) 57  Resp: (!) 26 (!) 23 (!) 29 20  Temp:      TempSrc:      SpO2: 99% 97% 97% 100%  Weight:      Height:       General exam: awake, alert, no acute distress HEENT: moist mucus membranes, hearing grossly normal  Respiratory system: normal respiratory effort. Cardiovascular system: RRR, no pedal edema.   Central nervous system: A&O x3. no gross focal neurologic deficits, normal speech Extremities: moves all, no edema, normal tone Skin: dry, intact, normal temperature Psychiatry: normal mood, congruent affect, judgement and insight appear normal   Data Reviewed:  Notable labs -- normal CBC  Family Communication: none  Disposition: Status is: Inpatient Remains inpatient appropriate because: monitor overnight s/p stent placement. Possible d/c tomorrow if no compications and cleared by cardiology.   Planned Discharge Destination: Home    Time spent: 35 minutes  Author: Ezekiel Slocumb, DO 02/08/2022 5:05 PM  For on call review [email or web address].

## 2022-02-08 NOTE — Progress Notes (Signed)
Pt ambulatory to toilet

## 2022-02-08 NOTE — Progress Notes (Signed)
*  PRELIMINARY RESULTS* Echocardiogram 2D Echocardiogram has been performed.  Kelsey Klein 02/08/2022, 8:53 AM

## 2022-02-08 NOTE — Progress Notes (Signed)
Pt talking to family on phone

## 2022-02-09 DIAGNOSIS — R079 Chest pain, unspecified: Secondary | ICD-10-CM | POA: Diagnosis not present

## 2022-02-09 DIAGNOSIS — I214 Non-ST elevation (NSTEMI) myocardial infarction: Secondary | ICD-10-CM | POA: Diagnosis not present

## 2022-02-09 LAB — BASIC METABOLIC PANEL
Anion gap: 7 (ref 5–15)
BUN: 12 mg/dL (ref 6–20)
CO2: 25 mmol/L (ref 22–32)
Calcium: 8.8 mg/dL — ABNORMAL LOW (ref 8.9–10.3)
Chloride: 106 mmol/L (ref 98–111)
Creatinine, Ser: 0.87 mg/dL (ref 0.44–1.00)
GFR, Estimated: >60 mL/min (ref >60)
Glucose, Bld: 92 mg/dL (ref 70–99)
Potassium: 4 mmol/L (ref 3.5–5.1)
Sodium: 138 mmol/L (ref 135–145)

## 2022-02-09 MED ORDER — NITROGLYCERIN 0.4 MG SL SUBL: 0.4000 mg | SUBLINGUAL_TABLET | SUBLINGUAL | 0 refills | Status: AC | PRN

## 2022-02-09 MED ORDER — TICAGRELOR 90 MG PO TABS: 90.0000 mg | ORAL_TABLET | Freq: Two times a day (BID) | ORAL | 2 refills | Status: DC

## 2022-02-09 MED ORDER — ATORVASTATIN CALCIUM 80 MG PO TABS: 80.0000 mg | ORAL_TABLET | Freq: Every day | ORAL | 2 refills | Status: DC

## 2022-02-09 MED ORDER — ASPIRIN 81 MG PO TBEC: 81.0000 mg | DELAYED_RELEASE_TABLET | Freq: Every day | ORAL | 12 refills | Status: AC

## 2022-02-09 MED ORDER — METOPROLOL TARTRATE 25 MG PO TABS: 12.5000 mg | ORAL_TABLET | Freq: Two times a day (BID) | ORAL | 2 refills | Status: DC

## 2022-02-09 NOTE — TOC Transition Note (Signed)
Transition of Care [facility]) - CM/SW Discharge Note   Patient Details  Name: Kelsey Klein MRN: [id number] Date of Birth: September 02, 1970  Transition of Care [facility]) CM/SW Contact:  Izola Price, RN Phone Number: 02/09/2022, 11:29 AM   Clinical Narrative:  8/26: DC orders in. Discharge instructions in AVS to follow up with    AMB Referral to Cardiac Rehabilitation - Phase II  Complete by: As directed  [facility]  [phone]   [place]  [id number] [place] [address]     Simmie Davies RN CM    Final next level of care: Home/Self Care Barriers to Discharge: Barriers Resolved   Patient Goals and CMS Choice        Discharge Placement                       Discharge Plan and Services                DME Arranged: N/A DME Agency: NA       HH Arranged: NA HH Agency: NA        Social Determinants of Health (SDOH) Interventions     Readmission Risk Interventions     No data to display

## 2022-02-09 NOTE — Discharge Summary (Signed)
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 *  Hospital Course: Kelsey Klein is a 51 y.o. African American female with medical history significant for breast cancer status postmastectomy and sentinel node biopsy followed by breast reconstruction in March 2019, on oral chemotherapy, presented to the ER on 02/07/2022 for evaluation of mid-sternal chest pain felt as pressure and graded 7/10 in severity with associated radiation to her left arm.  She had nausea and vomiting 2 hours after her chest pain.  She admitted to associated dyspnea.  Her chest pain lasted about 30 minutes and her dyspnea was longer.   In the ED, vitals were normal.  Labs notable for hs-troponin elevated at 1188 and later 1010.  EKG bradycardia at 52 bpm, T inversions anterolaterally and inferiorly.  Cardiology was consulted. Patient started on heparin infusion for ACS.  Cardiology took patient to cath lab on 8/25, found to have  95% mid/distal LAD stenosis.  A single drug-eluting stent was placed.    Assessment and Plan: * NSTEMI (non-ST elevated myocardial infarction) [facility]) Cardiology consulted. S/p left heart cath 8/25 with DES x 1 placed. Echo -- EF 55%, no LV regional WMA's, mild MR. Continue DAPT with ASA/Plavix x 1 year, then per cardiology Continue statin, beta-blocker SL nitroglycerin  PRN Follow up with cardiology as scheduled.    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 Pt is status post bilateral mastectomy and breast reconstruction in March 2019.   Initially on tamoxifen but it was discontinued due to the increasing depression and mood changes.  Transitioned to Arimidex since then and was expected to have a total of 5 years of therapy ending in March 2024 per Dr. Gary Fleet last visit on 08/14/2021.         Consultants: Cardiology Procedures performed: left heart cath, stent placement  Disposition: Home Diet recommendation:  Discharge Diet Orders (From admission, onward)     Start     Ordered   02/09/22 0000  Diet - low sodium heart healthy        02/09/22 1120           Cardiac diet DISCHARGE MEDICATION: Allergies as of 02/09/2022   No Known Allergies      Medication List     TAKE these medications    Adapalene 0.3 % gel SMARTSIG:sparingly Topical Every Night   anastrozole 1 MG tablet Commonly known as: ARIMIDEX Take 1 tablet (1 mg total) by mouth daily.   aspirin EC 81 MG tablet Take 1 tablet (81 mg total) by mouth daily. Swallow whole.   atorvastatin 80 MG tablet Commonly known as: LIPITOR Take 1 tablet (80 mg total) by mouth daily.   metoprolol tartrate 25 MG tablet Commonly known as: LOPRESSOR Take 0.5 tablets (12.5 mg total) by mouth 2 (two) times daily.  nitroGLYCERIN 0.4 MG SL tablet Commonly known as: NITROSTAT Place 1 tablet (0.4 mg total) under the tongue every 5 (five) minutes x 3 doses as needed for chest pain.   ticagrelor 90 MG Tabs tablet Commonly known as: BRILINTA Take 1 tablet (90 mg total) by mouth 2 (two) times daily.        Discharge Exam: Filed Weights   02/07/22 1839 02/08/22 1203  Weight: 69.9 kg 69.9 kg   General exam: awake, alert, no acute distress HEENT: atraumatic, clear conjunctiva, anicteric sclera, moist  mucus membranes, hearing grossly normal  Respiratory system: CTAB, no wheezes, rales or rhonchi, normal respiratory effort. Cardiovascular system: normal S1/S2,  RRR, no JVD, murmurs, rubs, gallops,  no pedal edema.   Gastrointestinal system: soft, NT, ND, no HSM felt, +bowel sounds. Central nervous system: A&O x4. no gross focal neurologic deficits, normal speech Extremities:R radial site with no surrounding tenderness or swelling, no edema, normal tone Skin: dry, intact, normal temperature, normal color,No rashes, lesions or ulcers Psychiatry: normal mood, congruent affect, judgement and insight appear normal   Condition at discharge: stable  The results of significant diagnostics from this hospitalization (including imaging, microbiology, ancillary and laboratory) are listed below for reference.   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 Mildly elevated left ventricular filling pressure (LVEDP 20 mmHg). Successful PCI to the mid/distal LAD using Onyx Frontier 2.25 x 18 mm drug-eluting stent with 0% residual stenosis and TIMI-3 flow. Recommendations: Overnight observation. Dual antiplatelet therapy with aspirin and ticagrelor for at least 12 months. 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 Aggressive secondary prevention of coronary artery disease. Nelva Bush, MD [facility]  ECHOCARDIOGRAM LIMITED  Result Date: 02/08/2022    ECHOCARDIOGRAM LIMITED REPORT   Patient Name:   Kelsey Klein Date of Exam: 02/08/2022 Medical Rec #:  [id number]        Height:       63.0 in Accession #:    [id number]       Weight:       154.0 lb Date of Birth:  11-Oct-1970        BSA:          1.730 m Patient Age:    51 years         BP:           115/83  mmHg Patient Gender: F                HR:           64 bpm. Exam Location:  [facility] Procedure: Limited Echo, Limited Color Doppler and Cardiac Doppler Indications:     I21.4 NSTEMI  History:         Patient has prior history of Echocardiogram examinations, most                  recent 10/30/2021. Hx of breast cancer.  Sonographer:     Charmayne Sheer Referring Phys:  [place] Diagnosing Phys: Ida Rogue MD  Sonographer Comments: Suboptimal apical window. Image acquisition challenging due to breast implants. IMPRESSIONS  1. Left ventricular ejection fraction, by estimation, is 55 . The left ventricle has normal function. The left ventricle has no regional wall motion abnormalities.  2. Right ventricular systolic function is normal. The right ventricular size  is normal. Tricuspid regurgitation signal is inadequate for assessing PA pressure.  3. The mitral valve is normal in structure. Mild mitral valve regurgitation. No evidence of mitral stenosis.  4. The aortic valve is normal in structure. Aortic valve regurgitation is not visualized. No aortic stenosis is present.  5. The inferior vena cava is normal in size with greater than 50% respiratory variability, suggesting right atrial pressure of 3 mmHg. FINDINGS  Left Ventricle: Left ventricular ejection fraction, by estimation, is 55%. The left ventricle has normal function. The left ventricle has no regional wall motion abnormalities. The left ventricular internal cavity size was normal in size. There is no left ventricular hypertrophy. Right Ventricle: The right ventricular size is normal. No increase in right ventricular wall thickness. Right ventricular systolic function is normal. Tricuspid regurgitation signal is inadequate for assessing PA pressure. Left Atrium: Left atrial size was normal in size. Right Atrium: Right atrial size was normal in size. Pericardium: There is no evidence of pericardial effusion. Mitral Valve: The mitral valve is normal in  structure. Mild mitral valve regurgitation. No evidence of mitral valve stenosis. Tricuspid Valve: The tricuspid valve is normal in structure. Tricuspid valve regurgitation is mild . No evidence of tricuspid stenosis. Aortic Valve: The aortic valve is normal in structure. Aortic valve regurgitation is not visualized. No aortic stenosis is present. Pulmonic Valve: The pulmonic valve was normal in structure. Pulmonic valve regurgitation is not visualized. No evidence of pulmonic stenosis. Aorta: The aortic root is normal in size and structure. Venous: The inferior vena cava is normal in size with greater than 50% respiratory variability, suggesting right atrial pressure of 3 mmHg. IAS/Shunts: No atrial level shunt detected by color flow Doppler. LEFT VENTRICLE PLAX 2D LVIDd:         3.97 cm LVIDs:         2.53 cm LV PW:         1.07 cm LV IVS:        0.80 cm  LEFT ATRIUM         Index LA diam:    3.10 cm 1.79 cm/m Ida Rogue MD Electronically signed by Ida Rogue MD Signature Date/Time: 02/08/2022/10:21:28 AM    Final    DG Chest 2 View  Result Date: 02/07/2022 CLINICAL DATA:  Chest pain and shortness of breath. EXAM: CHEST - 2 VIEW COMPARISON:  Chest radiograph dated 10/30/2021. FINDINGS: No focal consolidation, pleural effusion, pneumothorax. The cardiac silhouette is within limits. No acute osseous pathology. IMPRESSION: No active cardiopulmonary disease. Electronically Signed   By: Anner Crete M.D.   On: 02/07/2022 19:29    Microbiology: Results for orders placed or performed during the hospital encounter of 02/19/20  SARS CORONAVIRUS 2 (TAT 6-24 HRS) Nasopharyngeal Nasopharyngeal Swab     Status: None   Collection Time: 02/19/20 10:58 AM   Specimen: Nasopharyngeal Swab  Result Value Ref Range Status   SARS Coronavirus 2 NEGATIVE NEGATIVE Final    Comment: (NOTE) SARS-CoV-2 target nucleic acids are NOT DETECTED.  The SARS-CoV-2 RNA is generally detectable in upper and  lower respiratory specimens during the acute phase of infection. Negative results do not preclude SARS-CoV-2 infection, do not rule out co-infections with other pathogens, and should not be used as the sole basis for treatment or other patient management decisions. Negative results must be combined with clinical observations, patient history, and epidemiological information. The expected result is Negative.  Fact Sheet for Patients: SugarRoll.be  Fact Sheet for Healthcare Providers: https://www.woods-mathews.com/  This test is not yet approved or cleared by the [place] and  has been authorized for detection and/or diagnosis of SARS-CoV-2 by FDA under an Emergency Use Authorization (EUA). This EUA will remain  in effect (meaning this test can be used) for the duration of the COVID-19 declaration under Se ction 564(b)(1) of the Act, 21 U.S.C. section 360bbb-3(b)(1), unless the authorization is terminated or revoked sooner.  Performed at [facility], [place] [address] [place], [place] [address]     Labs: CBC: No results for input(s): "WBC", "NEUTROABS", "HGB", "HCT", "MCV", "PLT" in the last 168 hours.  Basic Metabolic Panel: No results for input(s): "NA", "K", "CL", "CO2", "GLUCOSE", "BUN", "CREATININE", "CALCIUM", "MG", "PHOS" in the last 168 hours.  Liver Function Tests: No results for input(s): "AST", "ALT", "ALKPHOS", "BILITOT", "PROT", "ALBUMIN" in the last 168 hours.  CBG: No results for input(s): "GLUCAP" in the last 168 hours.  Discharge time spent: less than 30 minutes.  Signed: Ezekiel Slocumb, DO [facility] 02/19/2022

## 2022-02-09 NOTE — Plan of Care (Signed)
  Problem: Education: Goal: Understanding of cardiac disease, CV risk reduction, and recovery process will improve Outcome: Progressing   Problem: Activity: Goal: Ability to tolerate increased activity will improve Outcome: Progressing   Problem: Cardiac: Goal: Ability to achieve and maintain adequate cardiovascular perfusion will improve Outcome: Progressing   Problem: Health Behavior/Discharge Planning: Goal: Ability to safely manage health-related needs after discharge will improve Outcome: Progressing   Problem: Education: Goal: Knowledge of General Education information will improve Description: Including pain rating scale, medication(s)/side effects and non-pharmacologic comfort measures Outcome: Progressing   Problem: Health Behavior/Discharge Planning: Goal: Ability to manage health-related needs will improve Outcome: Progressing   Problem: Clinical Measurements: Goal: Ability to maintain clinical measurements within normal limits will improve Outcome: Progressing Goal: Will remain free from infection Outcome: Progressing Goal: Diagnostic test results will improve Outcome: Progressing Goal: Respiratory complications will improve Outcome: Progressing Goal: Cardiovascular complication will be avoided Outcome: Progressing   Problem: Activity: Goal: Risk for activity intolerance will decrease Outcome: Progressing   Problem: Nutrition: Goal: Adequate nutrition will be maintained Outcome: Progressing   Problem: Coping: Goal: Level of anxiety will decrease Outcome: Progressing   Problem: Elimination: Goal: Will not experience complications related to bowel motility Outcome: Progressing Goal: Will not experience complications related to urinary retention Outcome: Progressing   Problem: Pain Managment: Goal: General experience of comfort will improve Outcome: Progressing   Problem: Safety: Goal: Ability to remain free from injury will improve Outcome:  Progressing   Problem: Skin Integrity: Goal: Risk for impaired skin integrity will decrease Outcome: Progressing   Problem: Education: Goal: Understanding of CV disease, CV risk reduction, and recovery process will improve Outcome: Progressing   Problem: Activity: Goal: Ability to return to baseline activity level will improve Outcome: Progressing   Problem: Cardiovascular: Goal: Ability to achieve and maintain adequate cardiovascular perfusion will improve Outcome: Progressing Goal: Vascular access site(s) Level 0-1 will be maintained Outcome: Progressing

## 2022-02-09 NOTE — Progress Notes (Signed)
Progress Note  Patient Name: Kelsey Klein Date of Encounter: 02/09/2022  Primary Cardiologist: End  Subjective   No further chest pain. No dyspnea, palpitations, dizziness, presyncope, or syncope. Left radial arteriotomy site is mildly sore. Post-cath vitals and BMP stable.   Inpatient Medications    Scheduled Meds:  aspirin EC  81 mg Oral Daily   atorvastatin  80 mg Oral Daily   enoxaparin (LOVENOX) injection  40 mg Subcutaneous Q24H   metoprolol tartrate  12.5 mg Oral BID   sodium chloride flush  3 mL Intravenous Q12H   ticagrelor  90 mg Oral BID   Continuous Infusions:  sodium chloride     PRN Meds: sodium chloride, acetaminophen, ALPRAZolam, magnesium hydroxide, morphine injection, nitroGLYCERIN, ondansetron (ZOFRAN) IV, sodium chloride flush, traZODone   Vital Signs    Vitals:   02/08/22 1958 02/08/22 2330 02/09/22 0436 02/09/22 0746  BP: 122/84 114/76 133/84 131/81  Pulse: 66 65 79 70  Resp: '20 14 18 18  '$ Temp: 98.2 F (36.8 C) (!) 97.5 F (36.4 C) 97.8 F (36.6 C) 97.9 F (36.6 C)  TempSrc:      SpO2: 100% 97% 100% 100%  Weight:      Height:       No intake or output data in the 24 hours ending 02/09/22 0815 Filed Weights   02/07/22 1839 02/08/22 1203  Weight: 69.9 kg 69.9 kg    Telemetry    SR with artifact - Personally Reviewed  ECG    NSR, 70 bpm, inferior and anterolateral TWI - Personally Reviewed  Physical Exam   GEN: No acute distress.   Neck: No JVD. Cardiac: RRR, no murmurs, rubs, or gallops. Left radial arteriotomy site is without active bleeding, bruising, swelling, warmth, erythema, or TTP. Left radial pulse 2+ proximal and distal to the arteriotomy site. Bandage in place is clean, dry, and intact.  Respiratory: Clear to auscultation bilaterally.  GI: Soft, nontender, non-distended.   MS: No edema; No deformity. Neuro:  Alert and oriented x 3; Nonfocal.  Psych: Normal affect.  Labs    Chemistry Recent Labs  Lab  02/07/22 1851 02/09/22 0550  NA 140 138  K 4.2 4.0  CL 106 106  CO2 25 25  GLUCOSE 106* 92  BUN 18 12  CREATININE 1.11* 0.87  CALCIUM 8.8* 8.8*  PROT 7.0  --   ALBUMIN 4.1  --   AST 43*  --   ALT 17  --   ALKPHOS 45  --   BILITOT 0.8  --   GFRNONAA >60 >60  ANIONGAP 9 7     Hematology Recent Labs  Lab 02/07/22 1851 02/08/22 0427  WBC 7.2 5.9  RBC 4.69 4.26  HGB 14.3 12.7  HCT 42.7 37.9  MCV 91.0 89.0  MCH 30.5 29.8  MCHC 33.5 33.5  RDW 13.3 13.5  PLT 170 176    Cardiac EnzymesNo results for input(s): "TROPONINI" in the last 168 hours. No results for input(s): "TROPIPOC" in the last 168 hours.   BNPNo results for input(s): "BNP", "PROBNP" in the last 168 hours.   DDimer No results for input(s): "DDIMER" in the last 168 hours.   Radiology    DG Chest 2 View  Result Date: 02/07/2022 IMPRESSION: No active cardiopulmonary disease. Electronically Signed   By: Anner Crete M.D.   On: 02/07/2022 19:29    Cardiac Studies   LHC 02/08/2022: Conclusions: Two-vessel coronary artery disease with 95% mid/distal LAD stenosis as well as chronic  appearing occlusion of very small rPL1 branch supplied by bridging and right-to-right collaterals. Mildly elevated left ventricular filling pressure (LVEDP 20 mmHg). Successful PCI to the mid/distal LAD using Onyx Frontier 2.25 x 18 mm drug-eluting stent with 0% residual stenosis and TIMI-3 flow.   Recommendations: Overnight observation. Dual antiplatelet therapy with aspirin and ticagrelor for at least 12 months. 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 Aggressive secondary prevention of coronary artery disease. __________  Limited echo 02/08/2022: 1. Left ventricular ejection fraction, by estimation, is 55 . The left  ventricle has normal function. The left ventricle has no regional wall  motion abnormalities.   2. Right ventricular systolic function is normal. The right ventricular   size is normal. Tricuspid regurgitation signal is inadequate for assessing  PA pressure.   3. The mitral valve is normal in structure. Mild mitral valve  regurgitation. No evidence of mitral stenosis.   4. The aortic valve is normal in structure. Aortic valve regurgitation is  not visualized. No aortic stenosis is present.   5. The inferior vena cava is normal in size with greater than 50%  respiratory variability, suggesting right atrial pressure of 3 mmHg.  Patient Profile     51 y.o. female with history of breast cancer status post bilateral mastectomy who is being seen today for the evaluation of NSTEMI at the request of Dr. Sidney Ace.  Assessment & Plan    1. 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 AKI: -Improved  We will arrange for follow up in our office in 1-2 weeks.     For questions or updates, please contact [place] Please consult [email or web address] for contact info under Cardiology/STEMI.    Signed, Christell Faith, PA-C [place] Pager: [phone] 02/09/2022, 8:15 AM

## 2022-02-11 ENCOUNTER — Encounter: Payer: Self-pay | Admitting: Internal Medicine

## 2022-02-11 ENCOUNTER — Telehealth: Payer: Self-pay | Admitting: Internal Medicine

## 2022-02-11 NOTE — Telephone Encounter (Signed)
Spoke with pt. Pt discharged from hospital 02/09/22 s/p coronary stent placement. Pt proceeds with several questions:  - Pt is hesitant to start atorvastatin 80 mg as prescribed. States her mom knows friends that have had issues with side effects and had to come off of medication. Pt has read side effects and concerned with effects that it can have on the liver as well as muscle issues. Did advise pt of rationale for prescribing stating post stent. Pt voices understanding and states she is agreeable for an alternative to atorvastatin. Will make Cadence aware as will see pt in office for hospital follow up.   - Pt requesting clarification of medications to take post discharge. Pt asks if she needs to take Aspirin. Advised per discharge summary, pt to continue Aspirin 81 mg daily. Pt aware she may purchase otc.   - Pt states she did not receive Brilinta at discharge. Notified pt in reviewing her charge, I see that Brilinta was prescribed to [facility] 02/09/22 #60 x 2 refills. Pt states she will pick this Rx up and will let us know if she has any issues getting medication.   - Pt confirms that she is continuing all other medications per med list without issue.   Confirmed pt's follow up appointment with Cadence Furth, PA-C 02/27/22 at 2:45 pm.   Pt has no further questions or concerns at this time.

## 2022-02-11 NOTE — Telephone Encounter (Signed)
Pt c/o medication issue:  1. Name of Medication: lipitor  2. How are you currently taking this medication (dosage and times per day)? 80 MG 1 tablet daily  3. Are you having a reaction (difficulty breathing--STAT)? no  4. What is your medication issue? Patient does not like the side effects that comes with it.  Patient would also like to know if she should take baby aspirin everyday along with other medications.

## 2022-02-15 LAB — POCT ACTIVATED CLOTTING TIME
Activated Clotting Time: 287 seconds
Activated Clotting Time: 335 seconds

## 2022-02-26 NOTE — Progress Notes (Signed)
Cardiology Office Note:    Date:  02/28/2022   ID:  Kelsey Klein, DOB [birth year], MRN [id number]  PCP:  [facility], [place] Cardiologist:  Nelva Bush, MD  [place] Electrophysiologist:  None   Referring MD: [facility], [place]   Chief Complaint: cath follow-up  History of Present Illness:    Kelsey Klein is a 51 y.o. female with a hx of breast cancer s/p bilateral mastectomy who presents for heart cath follow-up.   The patient was admitted to [facility] In June 2023 with chest pressure associated SOB, flushing and sweating. HS troponin peaked 224. CTA chest was negative. Echo showed EF 65-70%, no WMA, mild LVH, normal LV diastolic function parameters, trivial MR. Lexiscan showed no evidence of ischemia with a normal LVEF. No significant calcification was noted. Overall this is a low risk study.   She was readmitted 01/2022 with chest pain with associated dyspnea, nausea/vomiting that lasted for around 30 minutes. HS troponin peaked to 1188. She was given ASA and started on IV heparin. She was taken to th cath lab and found to have 95% mid/distal LAD stenosis treated with DES. She was started on ASA and Plavix x 1 year. Echo showed LVEF 55%, no WMA and mild MR. She was started on statin and BB.   Today, cardiac cath was reviewed in detail. She denies chest pain or shortness of breath. She has occasional spasm in the chest. She reports compliance with ASA and brilinta. No bleeding issues. Before the MI she was doing circuit training exercise, 3 days a week, she is wondering about getting back to that. Diet is good. She is willing to try cardiac rehab.   Past Medical History:  Diagnosis Date   Breast cancer, stage 1, estrogen receptor negative, right ([place]) 08/19/2015    18 mm; pT1c  pN0 (0/6nodes positive). ER/PR +; Her 2 neu not overexpressed   Family history of breast cancer     Past Surgical History:  Procedure Laterality Date    BREAST BIOPSY Left 05/27/2017   U/S core Bx- invasive mammary ER/PR positive Her2 negative   BREAST BIOPSY Left 05/27/2017   Affirm Bx- DCIS   BREAST RECONSTRUCTION WITH PLACEMENT OF TISSUE EXPANDER AND FLEX HD (ACELLULAR HYDRATED DERMIS) Bilateral 08/18/2017   Procedure: BREAST RECONSTRUCTION WITH PLACEMENT OF TISSUE EXPANDER AND FLEX HD (ACELLULAR HYDRATED DERMIS);  Surgeon: Wallace Going, DO;  Location: [facility];  Service: Plastics;  Laterality: Bilateral;   CORONARY STENT INTERVENTION N/A 02/08/2022   Procedure: CORONARY STENT INTERVENTION;  Surgeon: Nelva Bush, MD;  Location: [place] [facility];  Service: Cardiovascular;  Laterality: N/A;   LEFT HEART CATH AND CORONARY ANGIOGRAPHY N/A 02/08/2022   Procedure: LEFT HEART CATH AND CORONARY ANGIOGRAPHY;  Surgeon: Nelva Bush, MD;  Location: [place] [facility];  Service: Cardiovascular;  Laterality: N/A;   LIPOSUCTION WITH LIPOFILLING Bilateral 05/21/2018   Procedure: liposuction with fat grafting and revolve;  Surgeon: Wallace Going, DO;  Location: [place];  Service: Plastics;  Laterality: Bilateral;   LIPOSUCTION WITH LIPOFILLING Bilateral 02/23/2020   Procedure: Fat filling to bilateral breasts for symmetry;  Surgeon: Wallace Going, DO;  Location: [place];  Service: Plastics;  Laterality: Bilateral;   MASTECTOMY W/ SENTINEL NODE BIOPSY Bilateral 08/18/2017   Procedure: MASTECTOMY WITH SENTINEL LYMPH NODE BIOPSY,FROZEN SECTION;  Surgeon: Robert Bellow, MD;  Location: [facility];  Service: General;  Laterality: Bilateral;   REMOVAL OF  TISSUE EXPANDER AND PLACEMENT OF IMPLANT Bilateral 12/10/2017   Procedure: REMOVAL OF TISSUE EXPANDERS AND PLACEMENT OF IMPLANTS;  Surgeon: Wallace Going, DO;  Location: [place];  Service: Plastics;  Laterality: Bilateral;    Current Medications: Current Meds  Medication Sig   Adapalene 0.3 % gel SMARTSIG:sparingly Topical  Every Night   anastrozole (ARIMIDEX) 1 MG tablet Take 1 tablet (1 mg total) by mouth daily.   aspirin EC 81 MG tablet Take 1 tablet (81 mg total) by mouth daily. Swallow whole.   metoprolol tartrate (LOPRESSOR) 25 MG tablet Take 0.5 tablets (12.5 mg total) by mouth 2 (two) times daily.   nitroGLYCERIN (NITROSTAT) 0.4 MG SL tablet Place 1 tablet (0.4 mg total) under the tongue every 5 (five) minutes x 3 doses as needed for chest pain.   rosuvastatin (CRESTOR) 20 MG tablet Take 1 tablet (20 mg total) by mouth daily.   ticagrelor (BRILINTA) 90 MG TABS tablet Take 1 tablet (90 mg total) by mouth 2 (two) times daily.     Allergies:   Patient has no known allergies.   Social History   Socioeconomic History   Marital status: Single    Spouse name: Not on file   Number of children: Not on file   Years of education: Not on file   Highest education level: Not on file  Occupational History   Not on file  Tobacco Use   Smoking status: Never   Smokeless tobacco: Never  Vaping Use   Vaping Use: Never used  Substance and Sexual Activity   Alcohol use: Not Currently   Drug use: No   Sexual activity: Yes    Birth control/protection: Condom  Other Topics Concern   Not on file  Social History Narrative   Not on file   Social Determinants of Health   Financial Resource Strain: Not on file  Food Insecurity: Not on file  Transportation Needs: Not on file  Physical Activity: Not on file  Stress: Not on file  Social Connections: Not on file     Family History: The patient's family history includes Alzheimer's disease in her maternal grandmother; Arthritis in her mother; Breast cancer in her paternal aunt and paternal aunt; Cirrhosis in her father; Hypertension in her father and sister.  ROS:   Please see the history of present illness.     All other systems reviewed and are negative.  EKGs/Labs/Other Studies Reviewed:    The following studies were reviewed today:  LHC  02/08/2022: Conclusions: Two-vessel coronary artery disease with 95% mid/distal LAD stenosis as well as chronic appearing occlusion of very small rPL1 branch supplied by bridging and right-to-right collaterals. Mildly elevated left ventricular filling pressure (LVEDP 20 mmHg). Successful PCI to the mid/distal LAD using Onyx Frontier 2.25 x 18 mm drug-eluting stent with 0% residual stenosis and TIMI-3 flow.   Recommendations: Overnight observation. Dual antiplatelet therapy with aspirin and ticagrelor for at least 12 months. 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 Aggressive secondary prevention of coronary artery disease. __________   Limited echo 02/08/2022: 1. Left ventricular ejection fraction, by estimation, is 55 . The left  ventricle has normal function. The left ventricle has no regional wall  motion abnormalities.   2. Right ventricular systolic function is normal. The right ventricular  size is normal. Tricuspid regurgitation signal is inadequate for assessing  PA pressure.   3. The mitral valve is normal in structure. Mild mitral valve  regurgitation. No evidence  of mitral stenosis.   4. The aortic valve is normal in structure. Aortic valve regurgitation is  not visualized. No aortic stenosis is present.   5. The inferior vena cava is normal in size with greater than 50%  respiratory variability, suggesting right atrial pressure of 3 mmHg.  Lexiscan MPI 10/31/2021:   The study is normal.. The study is low risk.   No ST deviation was noted.   LV perfusion is normal.   Left ventricular function is normal. calculated LVEF is 73%.   No significant coronary artery calcifications noted. __________   2D echo 10/30/2021: 1. Left ventricular ejection fraction, by estimation, is 65 to 70%. The  left ventricle has normal function. The left ventricle has no regional  wall motion abnormalities. There is mild left ventricular hypertrophy.  Left  ventricular diastolic parameters  were normal.   2. Right ventricular systolic function is normal. The right ventricular  size is normal. There is normal pulmonary artery systolic pressure.   3. The mitral valve is normal in structure. Trivial mitral valve  regurgitation. No evidence of mitral stenosis.   4. The aortic valve is tricuspid. Aortic valve regurgitation is not  visualized. No aortic stenosis is present.   5. The inferior vena cava is normal in size with <50% respiratory  variability, suggesting right atrial pressure of 8 mmHg.  EKG:  EKG is ordered today.  The ekg ordered today demonstrates NSR 64bpm,  TWI inf leads  Recent Labs: 02/07/2022: ALT 17 02/08/2022: Hemoglobin 12.7; Platelets 176 02/09/2022: BUN 12; Creatinine, Ser 0.87; Potassium 4.0; Sodium 138  Recent Lipid Panel    Component Value Date/Time   CHOL 158 10/31/2021 0313   TRIG 38 10/31/2021 0313   HDL 82 10/31/2021 0313   CHOLHDL 1.9 10/31/2021 0313   VLDL 8 10/31/2021 0313   LDLCALC 68 10/31/2021 0313    Physical Exam:    VS:  BP (!) 120/90 (BP Location: Left Arm, Patient Position: Sitting, Cuff Size: Normal)   Pulse 64   Ht 5' 3"  (1.6 m)   Wt 154 lb 2 oz (69.9 kg)   LMP 12/15/2021 (Approximate)   SpO2 99%   BMI 27.30 kg/m     Wt Readings from Last 3 Encounters:  02/27/22 154 lb 2 oz (69.9 kg)  02/08/22 154 lb (69.9 kg)  11/05/21 157 lb (71.2 kg)     GEN:  Well nourished, well developed in no acute distress HEENT: Normal NECK: No JVD; No carotid bruits LYMPHATICS: No lymphadenopathy CARDIAC: RRR, no murmurs, rubs, gallops RESPIRATORY:  Clear to auscultation without rales, wheezing or rhonchi  ABDOMEN: Soft, non-tender, non-distended MUSCULOSKELETAL:  No edema; No deformity  SKIN: Warm and dry NEUROLOGIC:  Alert and oriented x 3 PSYCHIATRIC:  Normal affect   ASSESSMENT:    1. Chest pain with moderate risk for cardiac etiology   2. Coronary artery disease involving native coronary artery  of native heart without angina pectoris    PLAN:    In order of problems listed above:  NSTEMI CAD s/p PCI mLAD Recent NSTEMI with DES placement mid/distal LAD and residual nonobstructive disease. Overall she is recovering well. Cath site is stable. Continue DAPT with ASA and Brilinta. She denies bleeding issues. LDL 68. Patient is concerned with side affects from Lipitor, we will switch to Crestor 62m daily. Check LFTs/lipids at next visit. Bp is good today. Continue Lopressor. She is active at baseline and plans to slowly get back into circuit training. I will refer her  to cardiac rehab.   Disposition: Follow up in 1 month(s) with MD/APP    Signed, Maecy Podgurski Ninfa Meeker, PA-C  02/28/2022 3:23 PM    [facility]

## 2022-02-27 ENCOUNTER — Encounter: Payer: Self-pay | Admitting: Medical

## 2022-02-27 ENCOUNTER — Ambulatory Visit: Payer: Medicaid Other | Attending: Medical | Admitting: Medical

## 2022-02-27 VITALS — BP 120/90 | HR 64 | Ht 63.0 in | Wt 154.1 lb

## 2022-02-27 DIAGNOSIS — R079 Chest pain, unspecified: Secondary | ICD-10-CM | POA: Diagnosis not present

## 2022-02-27 DIAGNOSIS — I2510 Atherosclerotic heart disease of native coronary artery without angina pectoris: Secondary | ICD-10-CM

## 2022-02-27 MED ORDER — ROSUVASTATIN CALCIUM 20 MG PO TABS: 20.0000 mg | ORAL_TABLET | Freq: Every day | ORAL | 6 refills | Status: DC

## 2022-02-27 NOTE — Patient Instructions (Addendum)
Medication Instructions:  -Your physician has recommended you make the following change in your medication:   1) STOP Lipitor (Atorvastatin)  2) START Crestor (Rosuvastatin) 20 mg: - take 1 tablet by mouth once daily    *If you need a refill on your cardiac medications before your next appointment, please call your pharmacy*   Lab Work: - none ordered  If you have labs (blood work) drawn today and your tests are completely normal, you will receive your results only by: [place] (if you have MyChart) OR A paper copy in the mail If you have any lab test that is abnormal or we need to change your treatment, we will call you to review the results.   Testing/Procedures: - You have been referred to cardiac rehab. This is a combination program including monitored exercise, dietary education, and support group. We strongly recommend participating in the program. Expect a phone call from them in approximately 2 weeks. Please call [phone] to follow up if you do not hear from them in this time frame.     Follow-Up: At [facility], you and your health needs are our priority.  As part of our continuing mission to provide you with exceptional heart care, we have created designated Provider Care Teams.  These Care Teams include your primary Cardiologist (physician) and Advanced Practice Providers (APPs -  Physician Assistants and Nurse Practitioners) who all work together to provide you with the care you need, when you need it.  We recommend signing up for the patient portal called "MyChart".  Sign up information is provided on this After Visit Summary.  MyChart is used to connect with patients for Virtual Visits (Telemedicine).  Patients are able to view lab/test results, encounter notes, upcoming appointments, etc.  Non-urgent messages can be sent to your provider as well.   To learn more about what you can do with MyChart, go to [email or web address].    Your next  appointment:   1 month(s)  The format for your next appointment:   In Person  Provider:   You may see Nelva Bush, MD or one of the following Advanced Practice Providers on your designated Care Team:    Cadence Kathlen Mody, [place]   Other Instructions  Rosuvastatin Tablets What is this medication? ROSUVASTATIN (roe SOO va sta tin) treats high cholesterol and reduces the risk of heart attack and stroke. It works by decreasing bad cholesterol and fats (such as LDL, triglycerides) and increasing good cholesterol (HDL) in your blood. It belongs to a group of medications called statins. Changes to diet and exercise are often combined with this medication. This medicine may be used for other purposes; ask your health care provider or pharmacist if you have questions. COMMON BRAND NAME(S): Crestor What should I tell my care team before I take this medication? They need to know if you have any of these conditions: Diabetes Frequently drink alcohol Kidney disease Liver disease Muscle cramps, pain Stroke Thyroid disease An unusual or allergic reaction to rosuvastatin, other medications, foods, dyes, or preservatives Pregnant or trying to get pregnant Breastfeeding How should I use this medication? Take this medication by mouth with a glass of water. Follow the directions on the prescription label. You can take it with or without food. If it upsets your stomach, take it with food. Do not cut, crush or chew this medication. Swallow the tablets whole. Take your medication at regular intervals. Do not take it more often than directed. Take antacids that have a  combination of aluminum and magnesium hydroxide in them at a different time of day than this medication. Take these products 2 hours AFTER this medication. Talk to your care team about the use of this medication in children. While this medication may be prescribed for children as young as 7 for selected conditions, precautions do  apply. Overdosage: If you think you have taken too much of this medicine contact a poison control center or emergency room at once. NOTE: This medicine is only for you. Do not share this medicine with others. What if I miss a dose? If you miss a dose, take it as soon as you can. If your next dose is to be taken in less than 12 hours, then do not take the missed dose. Take the next dose at your regular time. Do not take double or extra doses. What may interact with this medication? Do not take this medication with any of the following: Supplements like red yeast rice This medication may also interact with the following: Alcohol Antacids containing aluminum hydroxide and magnesium hydroxide Cyclosporine Other medications for high cholesterol Some medications for HIV infection Warfarin This list may not describe all possible interactions. Give your health care provider a list of all the medicines, herbs, non-prescription drugs, or dietary supplements you use. Also tell them if you smoke, drink alcohol, or use illegal drugs. Some items may interact with your medicine. What should I watch for while using this medication? Visit your care team for regular checks on your progress. Tell your care team if your symptoms do not start to get better or if they get worse. Your care team may tell you to stop taking this medication if you develop muscle problems. If your muscle problems do not go away after stopping this medication, contact your care team. Talk to your care team if you may be pregnant. Serious birth defects can occur if you take this medication during pregnancy. Talk to your care team before breastfeeding. Changes to your treatment plan may be needed. This medication may increase blood sugar. Ask your care team if changes in diet or medications are needed if you have diabetes. If you are going to need surgery or other procedure, tell your care team that you are using this medication. Taking  this medication is only part of a total heart healthy program. Ask your care team if there are other changes you can make to improve your overall health. What side effects may I notice from receiving this medication? Side effects that you should report to your care team as soon as possible: Allergic reactions--skin rash, itching, hives, swelling of the face, lips, tongue, or throat High blood sugar (hyperglycemia)--increased thirst or amount of urine, unusual weakness, fatigue, blurry vision Liver injury--right upper belly pain, loss of appetite, nausea, light-colored stool, dark yellow or brown urine, yellowing skin or eyes, unusual weakness, fatigue Muscle injury--unusual weakness, fatigue, muscle pain, dark yellow or brown urine, decrease in amount of urine Redness, blistering, peeling or loosening of the skin, including inside the mouth Side effects that usually do not require medical attention (report to your care team if they continue or are bothersome): Fatigue Headache Nausea Stomach pain This list may not describe all possible side effects. Call your doctor for medical advice about side effects. You may report side effects to FDA at 1-800-FDA-1088. Where should I keep my medication? Keep out of the reach of children and pets. Store between 20 and 25 degrees C (68 and 77 degrees F).  Get rid of any unused medication after the expiration date. To get rid of medications that are no longer needed or have expired: Take the medication to a medication take-back program. Check with your pharmacy or law enforcement to find a location. If you cannot return the medication, check the label or package insert to see if the medication should be thrown out in the garbage or flushed down the toilet. If you are not sure, ask your care team. If it is safe to put it in the trash, take the medication out of the container. Mix the medication with cat litter, dirt, coffee grounds, or other unwanted substance.  Seal the mixture in a bag or container. Put it in the trash. NOTE: This sheet is a summary. It may not cover all possible information. If you have questions about this medicine, talk to your doctor, pharmacist, or health care provider.  2023 Elsevier/Gold Standard (2020-04-15 00:00:00)   Important Information About Sugar

## 2022-02-28 ENCOUNTER — Telehealth: Payer: Self-pay | Admitting: Medical

## 2022-02-28 NOTE — Telephone Encounter (Signed)
Pt saw C Furth PA yesterday but forgot to ask her if she can fly out of the country for vacation next week since she had an MI last month..   I advised her that I will forward to her for her review and recommendation.

## 2022-02-28 NOTE — Telephone Encounter (Signed)
Furth, Cadence H, PA-C  You Just now (11:39 AM)    She is OK to travel     Pt advised.

## 2022-02-28 NOTE — Telephone Encounter (Signed)
New ZMessage:      Patient said she had a heart attack on 02-08-22. Patient wants to know if it will be alright for her  to fly next week for her vacation?

## 2022-03-18 ENCOUNTER — Other Ambulatory Visit: Payer: Self-pay

## 2022-03-18 ENCOUNTER — Encounter: Payer: Medicaid Other | Attending: Internal Medicine

## 2022-03-18 DIAGNOSIS — I252 Old myocardial infarction: Secondary | ICD-10-CM | POA: Insufficient documentation

## 2022-03-18 DIAGNOSIS — Z955 Presence of coronary angioplasty implant and graft: Secondary | ICD-10-CM | POA: Insufficient documentation

## 2022-03-18 DIAGNOSIS — I214 Non-ST elevation (NSTEMI) myocardial infarction: Secondary | ICD-10-CM

## 2022-03-18 NOTE — Progress Notes (Signed)
Virtual Visit completed. Patient informed on EP and RD appointment and 6 Minute walk test. Patient also informed of patient health questionnaires on My Chart. Patient Verbalizes understanding. Visit diagnosis can be found in [facility] 02/07/2022.

## 2022-03-19 VITALS — Ht 65.0 in | Wt 154.3 lb

## 2022-03-19 DIAGNOSIS — I252 Old myocardial infarction: Secondary | ICD-10-CM | POA: Diagnosis present

## 2022-03-19 DIAGNOSIS — Z955 Presence of coronary angioplasty implant and graft: Secondary | ICD-10-CM

## 2022-03-19 DIAGNOSIS — I214 Non-ST elevation (NSTEMI) myocardial infarction: Secondary | ICD-10-CM

## 2022-03-19 NOTE — Progress Notes (Signed)
Cardiac Individual Treatment Plan  Patient Details  Name: Kelsey Klein MRN: [id number] Date of Birth: [birth year] Referring Provider:   Flowsheet Row Cardiac Rehab from 03/19/2022 in [facility] Cardiac and Pulmonary Rehab  Referring Provider Nelva Bush, MD       Initial Encounter Date:  Flowsheet Row Cardiac Rehab from 03/19/2022 in [facility] Cardiac and Pulmonary Rehab  Date 03/19/22       Visit Diagnosis: NSTEMI (non-ST elevated myocardial infarction) [facility])  Status post coronary artery stent placement  Patient's Home Medications on Admission:  Current Outpatient Medications:    Adapalene 0.3 % gel, SMARTSIG:sparingly Topical Every Night, Disp: , Rfl:    anastrozole (ARIMIDEX) 1 MG tablet, Take 1 tablet (1 mg total) by mouth daily., Disp: 90 tablet, Rfl: 3   aspirin EC 81 MG tablet, Take 1 tablet (81 mg total) by mouth daily. Swallow whole., Disp: 30 tablet, Rfl: 12   metoprolol tartrate (LOPRESSOR) 25 MG tablet, Take 0.5 tablets (12.5 mg total) by mouth 2 (two) times daily., Disp: 60 tablet, Rfl: 2   nitroGLYCERIN (NITROSTAT) 0.4 MG SL tablet, Place 1 tablet (0.4 mg total) under the tongue every 5 (five) minutes x 3 doses as needed for chest pain., Disp: 30 tablet, Rfl: 0   rosuvastatin (CRESTOR) 20 MG tablet, Take 1 tablet (20 mg total) by mouth daily., Disp: 30 tablet, Rfl: 6   ticagrelor (BRILINTA) 90 MG TABS tablet, Take 1 tablet (90 mg total) by mouth 2 (two) times daily., Disp: 60 tablet, Rfl: 2  Past Medical History: Past Medical History:  Diagnosis Date   Breast cancer, stage 1, estrogen receptor negative, right ([place]) 08/19/2015    18 mm; pT1c  pN0 (0/6nodes positive). ER/PR +; Her 2 neu not overexpressed   Family history of breast cancer     Tobacco Use: Social History   Tobacco Use  Smoking Status Never  Smokeless Tobacco Never    Labs: Review Flowsheet       Latest Ref Rng & Units 10/31/2021  Labs for ITP Cardiac and Pulmonary Rehab  Cholestrol 0 - 200  mg/dL 158   LDL (calc) 0 - 99 mg/dL 68   HDL-C >40 mg/dL 82   Trlycerides <150 mg/dL 38   Hemoglobin A1c 4.8 - 5.6 % 5.8      Exercise Target Goals: Exercise Program Goal: Individual exercise prescription set using results from initial 6 min walk test and THRR while considering  patient's activity barriers and safety.   Exercise Prescription Goal: Initial exercise prescription builds to 30-45 minutes a day of aerobic activity, 2-3 days per week.  Home exercise guidelines will be given to patient during program as part of exercise prescription that the participant will acknowledge.   Education: Aerobic Exercise: - Group verbal and visual presentation on the components of exercise prescription. Introduces F.I.T.T principle from ACSM for exercise prescriptions.  Reviews F.I.T.T. principles of aerobic exercise including progression. Written material given at graduation. Flowsheet Row Cardiac Rehab from 03/19/2022 in [facility] Cardiac and Pulmonary Rehab  Education need identified 03/19/22       Education: Resistance Exercise: - Group verbal and visual presentation on the components of exercise prescription. Introduces F.I.T.T principle from ACSM for exercise prescriptions  Reviews F.I.T.T. principles of resistance exercise including progression. Written material given at graduation.    Education: Exercise & Equipment Safety: - Individual verbal instruction and demonstration of equipment use and safety with use of the equipment. Flowsheet Row Cardiac Rehab from 03/19/2022 in [facility] Cardiac and Pulmonary Rehab  Date 03/18/22  Educator [facility]  Instruction Review Code 1- Verbalizes Understanding       Education: Exercise Physiology & General Exercise Guidelines: - Group verbal and written instruction with models to review the exercise physiology of the cardiovascular system and associated critical values. Provides general exercise guidelines with specific guidelines to those with heart or lung  disease.    Education: Flexibility, Balance, Mind/Body Relaxation: - Group verbal and visual presentation with interactive activity on the components of exercise prescription. Introduces F.I.T.T principle from ACSM for exercise prescriptions. Reviews F.I.T.T. principles of flexibility and balance exercise training including progression. Also discusses the mind body connection.  Reviews various relaxation techniques to help reduce and manage stress (i.e. Deep breathing, progressive muscle relaxation, and visualization). Balance handout provided to take home. Written material given at graduation.   Activity Barriers & Risk Stratification:  Activity Barriers & Cardiac Risk Stratification - 03/19/22 0946       Activity Barriers & Cardiac Risk Stratification   Activity Barriers None    Cardiac Risk Stratification High             6 Minute Walk:  6 Minute Walk     Row Name 03/19/22 0945         6 Minute Walk   Phase Initial     Distance 1565 feet     Walk Time 6 minutes     # of Rest Breaks 0     MPH 2.96     METS 4.66     RPE 8     Perceived Dyspnea  0     VO2 Peak 16.32     Symptoms No     Resting HR 57 bpm     Resting BP 124/78     Resting Oxygen Saturation  99 %     Exercise Oxygen Saturation  during 6 min walk 100 %     Max Ex. HR 118 bpm     Max Ex. BP 128/84     2 Minute Post BP 116/70              Oxygen Initial Assessment:   Oxygen Re-Evaluation:   Oxygen Discharge (Final Oxygen Re-Evaluation):   Initial Exercise Prescription:  Initial Exercise Prescription - 03/19/22 1000       Date of Initial Exercise RX and Referring Provider   Date 03/19/22    Referring Provider Nelva Bush, MD      Oxygen   Maintain Oxygen Saturation 88% or higher      Treadmill   MPH 3    Grade 1.5    Minutes 15    METs 3.92      REL-XR   Level 2    Minutes 15    METs 4.66      Prescription Details   Frequency (times per week) 2    Duration Progress to  30 minutes of continuous aerobic without signs/symptoms of physical distress      Intensity   THRR 40-80% of Max Heartrate 102-147    Ratings of Perceived Exertion 11-13    Perceived Dyspnea 0-4      Progression   Progression Continue to progress workloads to maintain intensity without signs/symptoms of physical distress.      Resistance Training   Training Prescription Yes    Weight 4 lb    Reps 10-15             Perform Capillary Blood Glucose checks as needed.  Exercise Prescription Changes:  Exercise Prescription Changes     Row Name 03/19/22 1000             Response to Exercise   Blood Pressure (Admit) 124/78       Blood Pressure (Exercise) 128/84       Blood Pressure (Exit) 116/70       Heart Rate (Admit) 57 bpm       Heart Rate (Exercise) 118 bpm       Heart Rate (Exit) 56 bpm       Oxygen Saturation (Admit) 99 %       Oxygen Saturation (Exercise) 100 %       Rating of Perceived Exertion (Exercise) 8       Perceived Dyspnea (Exercise) 0       Symptoms none       Comments 6MWT Results                Exercise Comments:   Exercise Goals and Review:   Exercise Goals     Row Name 03/19/22 1014             Exercise Goals   Increase Physical Activity Yes       Intervention Develop an individualized exercise prescription for aerobic and resistive training based on initial evaluation findings, risk stratification, comorbidities and participant's personal goals.;Provide advice, education, support and counseling about physical activity/exercise needs.       Expected Outcomes Long Term: Add in home exercise to make exercise part of routine and to increase amount of physical activity.;Long Term: Exercising regularly at least 3-5 days a week.;Short Term: Attend rehab on a regular basis to increase amount of physical activity.       Increase Strength and Stamina Yes       Intervention Provide advice, education, support and counseling about physical  activity/exercise needs.;Develop an individualized exercise prescription for aerobic and resistive training based on initial evaluation findings, risk stratification, comorbidities and participant's personal goals.       Expected Outcomes Short Term: Perform resistance training exercises routinely during rehab and add in resistance training at home;Short Term: Increase workloads from initial exercise prescription for resistance, speed, and METs.;Long Term: Improve cardiorespiratory fitness, muscular endurance and strength as measured by increased METs and functional capacity (6MWT)       Able to understand and use rate of perceived exertion (RPE) scale Yes       Intervention Provide education and explanation on how to use RPE scale       Expected Outcomes Short Term: Able to use RPE daily in rehab to express subjective intensity level;Long Term:  Able to use RPE to guide intensity level when exercising independently       Able to understand and use Dyspnea scale Yes       Intervention Provide education and explanation on how to use Dyspnea scale       Expected Outcomes Short Term: Able to use Dyspnea scale daily in rehab to express subjective sense of shortness of breath during exertion;Long Term: Able to use Dyspnea scale to guide intensity level when exercising independently       Knowledge and understanding of Target Heart Rate Range (THRR) Yes       Intervention Provide education and explanation of THRR including how the numbers were predicted and where they are located for reference       Expected Outcomes Short Term: Able to state/look up THRR;Long Term: Able to use THRR to govern intensity when exercising  independently;Short Term: Able to use daily as guideline for intensity in rehab       Able to check pulse independently Yes       Intervention Provide education and demonstration on how to check pulse in carotid and radial arteries.;Review the importance of being able to check your own pulse for  safety during independent exercise       Expected Outcomes Short Term: Able to explain why pulse checking is important during independent exercise;Long Term: Able to check pulse independently and accurately       Understanding of Exercise Prescription Yes       Intervention Provide education, explanation, and written materials on patient's individual exercise prescription       Expected Outcomes Short Term: Able to explain program exercise prescription;Long Term: Able to explain home exercise prescription to exercise independently                Exercise Goals Re-Evaluation :   Discharge Exercise Prescription (Final Exercise Prescription Changes):  Exercise Prescription Changes - 03/19/22 1000       Response to Exercise   Blood Pressure (Admit) 124/78    Blood Pressure (Exercise) 128/84    Blood Pressure (Exit) 116/70    Heart Rate (Admit) 57 bpm    Heart Rate (Exercise) 118 bpm    Heart Rate (Exit) 56 bpm    Oxygen Saturation (Admit) 99 %    Oxygen Saturation (Exercise) 100 %    Rating of Perceived Exertion (Exercise) 8    Perceived Dyspnea (Exercise) 0    Symptoms none    Comments 6MWT Results             Nutrition:  Target Goals: Understanding of nutrition guidelines, daily intake of sodium '1500mg'$ , cholesterol '200mg'$ , calories 30% from fat and 7% or less from saturated fats, daily to have 5 or more servings of fruits and vegetables.  Education: All About Nutrition: -Group instruction provided by verbal, written material, interactive activities, discussions, models, and posters to present general guidelines for heart healthy nutrition including fat, fiber, MyPlate, the role of sodium in heart healthy nutrition, utilization of the nutrition label, and utilization of this knowledge for meal planning. Follow up email sent as well. Written material given at graduation. Flowsheet Row Cardiac Rehab from 03/19/2022 in [facility] Cardiac and Pulmonary Rehab  Education need identified  03/19/22       Biometrics:  Pre Biometrics - 03/19/22 0944       Pre Biometrics   Height '5\' 5"'$  (1.651 m)    Weight 154 lb 4.8 oz (70 kg)    Waist Circumference 30.5 inches    Hip Circumference 37 inches    Waist to Hip Ratio 0.82 %    BMI (Calculated) 25.68    Single Leg Stand 30 seconds              Nutrition Therapy Plan and Nutrition Goals:   Nutrition Assessments:  MEDIFICTS Score Key: ?70 Need to make dietary changes  40-70 Heart Healthy Diet ? 40 Therapeutic Level Cholesterol Diet  Flowsheet Row Cardiac Rehab from 03/19/2022 in [facility] Cardiac and Pulmonary Rehab  Picture Your Plate Total Score on Admission 64      Picture Your Plate Scores: <53 Unhealthy dietary pattern with much room for improvement. 41-50 Dietary pattern unlikely to meet recommendations for good health and room for improvement. 51-60 More healthful dietary pattern, with some room for improvement.  >60 Healthy dietary pattern, although there may be some specific behaviors  that could be improved.    Nutrition Goals Re-Evaluation:   Nutrition Goals Discharge (Final Nutrition Goals Re-Evaluation):   Psychosocial: Target Goals: Acknowledge presence or absence of significant depression and/or stress, maximize coping skills, provide positive support system. Participant is able to verbalize types and ability to use techniques and skills needed for reducing stress and depression.   Education: Stress, Anxiety, and Depression - Group verbal and visual presentation to define topics covered.  Reviews how body is impacted by stress, anxiety, and depression.  Also discusses healthy ways to reduce stress and to treat/manage anxiety and depression.  Written material given at graduation.   Education: Sleep Hygiene -Provides group verbal and written instruction about how sleep can affect your health.  Define sleep hygiene, discuss sleep cycles and impact of sleep habits. Review good sleep hygiene tips.     Initial Review & Psychosocial Screening:  Initial Psych Review & Screening - 03/18/22 0939       Initial Review   Current issues with Current Sleep Concerns      Family Dynamics   Good Support System? Yes    Comments Anhar has sleep concerns and does not sleep well. She has good family support system and looks to her mother, sister and daughter.      Barriers   Psychosocial barriers to participate in program There are no identifiable barriers or psychosocial needs.;The patient should benefit from training in stress management and relaxation.      Screening Interventions   Interventions Encouraged to exercise;To provide support and resources with identified psychosocial needs;Provide feedback about the scores to participant    Expected Outcomes Short Term goal: Utilizing psychosocial counselor, staff and physician to assist with identification of specific Stressors or current issues interfering with healing process. Setting desired goal for each stressor or current issue identified.;Long Term Goal: Stressors or current issues are controlled or eliminated.;Short Term goal: Identification and review with participant of any Quality of Life or Depression concerns found by scoring the questionnaire.;Long Term goal: The participant improves quality of Life and PHQ9 Scores as seen by post scores and/or verbalization of changes             Quality of Life Scores:   Quality of Life - 03/19/22 0942       Quality of Life   Select Quality of Life      Quality of Life Scores   Health/Function Pre 21.7 %    Socioeconomic Pre 19.86 %    Psych/Spiritual Pre 22 %    Family Pre 22.8 %    GLOBAL Pre 21.54 %            Scores of 19 and below usually indicate a poorer quality of life in these areas.  A difference of  2-3 points is a clinically meaningful difference.  A difference of 2-3 points in the total score of the Quality of Life Index has been associated with significant improvement  in overall quality of life, self-image, physical symptoms, and general health in studies assessing change in quality of life.  PHQ-9: Review Flowsheet       03/19/2022  Depression screen PHQ 2/9  Decreased Interest 1  Down, Depressed, Hopeless 0  PHQ - 2 Score 1  Altered sleeping 3  Tired, decreased energy 1  Change in appetite 0  Feeling bad or failure about yourself  0  Trouble concentrating 1  Moving slowly or fidgety/restless 0  Suicidal thoughts 0  PHQ-9 Score 6  Difficult doing  work/chores Not difficult at all   Interpretation of Total Score  Total Score Depression Severity:  1-4 = Minimal depression, 5-9 = Mild depression, 10-14 = Moderate depression, 15-19 = Moderately severe depression, 20-27 = Severe depression   Psychosocial Evaluation and Intervention:  Psychosocial Evaluation - 03/18/22 0940       Psychosocial Evaluation & Interventions   Interventions Encouraged to exercise with the program and follow exercise prescription;Relaxation education;Stress management education    Comments Blaike has sleep concerns and does not sleep well. She has good family support system and looks to her mother, sister and daughter.    Expected Outcomes Short: Start HeartTrack to help with mood. Long: Maintain a healthy mental state    Continue Psychosocial Services  Follow up required by staff             Psychosocial Re-Evaluation:   Psychosocial Discharge (Final Psychosocial Re-Evaluation):   Vocational Rehabilitation: Provide vocational rehab assistance to qualifying candidates.   Vocational Rehab Evaluation & Intervention:   Education: Education Goals: Education classes will be provided on a variety of topics geared toward better understanding of heart health and risk factor modification. Participant will state understanding/return demonstration of topics presented as noted by education test scores.  Learning Barriers/Preferences:  Learning Barriers/Preferences -  03/18/22 7564       Learning Barriers/Preferences   Learning Barriers None    Learning Preferences None             General Cardiac Education Topics:  AED/CPR: - Group verbal and written instruction with the use of models to demonstrate the basic use of the AED with the basic ABC's of resuscitation.   Anatomy and Cardiac Procedures: - Group verbal and visual presentation and models provide information about basic cardiac anatomy and function. Reviews the testing methods done to diagnose heart disease and the outcomes of the test results. Describes the treatment choices: Medical Management, Angioplasty, or Coronary Bypass Surgery for treating various heart conditions including Myocardial Infarction, Angina, Valve Disease, and Cardiac Arrhythmias.  Written material given at graduation.   Medication Safety: - Group verbal and visual instruction to review commonly prescribed medications for heart and lung disease. Reviews the medication, class of the drug, and side effects. Includes the steps to properly store meds and maintain the prescription regimen.  Written material given at graduation.   Intimacy: - Group verbal instruction through game format to discuss how heart and lung disease can affect sexual intimacy. Written material given at graduation..   Know Your Numbers and Heart Failure: - Group verbal and visual instruction to discuss disease risk factors for cardiac and pulmonary disease and treatment options.  Reviews associated critical values for Overweight/Obesity, Hypertension, Cholesterol, and Diabetes.  Discusses basics of heart failure: signs/symptoms and treatments.  Introduces Heart Failure Zone chart for action plan for heart failure.  Written material given at graduation.   Infection Prevention: - Provides verbal and written material to individual with discussion of infection control including proper hand washing and proper equipment cleaning during exercise  session. Flowsheet Row Cardiac Rehab from 03/19/2022 in [facility] Cardiac and Pulmonary Rehab  Date 03/18/22  Educator [facility]  Instruction Review Code 1- Verbalizes Understanding       Falls Prevention: - Provides verbal and written material to individual with discussion of falls prevention and safety. Flowsheet Row Cardiac Rehab from 03/19/2022 in [facility] Cardiac and Pulmonary Rehab  Date 03/18/22  Educator [facility]  Instruction Review Code 1- Verbalizes Understanding  Other: -Provides group and verbal instruction on various topics (see comments)   Knowledge Questionnaire Score:  Knowledge Questionnaire Score - 03/19/22 0942       Knowledge Questionnaire Score   Pre Score 24/26             Core Components/Risk Factors/Patient Goals at Admission:  Personal Goals and Risk Factors at Admission - 03/19/22 0942       Core Components/Risk Factors/Patient Goals on Admission    Weight Management Yes;Weight Maintenance    Intervention Weight Management: Develop a combined nutrition and exercise program designed to reach desired caloric intake, while maintaining appropriate intake of nutrient and fiber, sodium and fats, and appropriate energy expenditure required for the weight goal.;Weight Management: Provide education and appropriate resources to help participant work on and attain dietary goals.    Admit Weight 154 lb 4.8 oz (70 kg)    Goal Weight: Short Term 154 lb 4.8 oz (70 kg)    Goal Weight: Long Term 154 lb 4.8 oz (70 kg)    Expected Outcomes Short Term: Continue to assess and modify interventions until short term weight is achieved;Long Term: Adherence to nutrition and physical activity/exercise program aimed toward attainment of established weight goal;Weight Maintenance: Understanding of the daily nutrition guidelines, which includes 25-35% calories from fat, 7% or less cal from saturated fats, less than '200mg'$  cholesterol, less than 1.5gm of sodium, & 5 or more servings of fruits  and vegetables daily;Understanding recommendations for meals to include 15-35% energy as protein, 25-35% energy from fat, 35-60% energy from carbohydrates, less than '200mg'$  of dietary cholesterol, 20-35 gm of total fiber daily;Understanding of distribution of calorie intake throughout the day with the consumption of 4-5 meals/snacks    Hypertension Yes    Intervention Provide education on lifestyle modifcations including regular physical activity/exercise, weight management, moderate sodium restriction and increased consumption of fresh fruit, vegetables, and low fat dairy, alcohol moderation, and smoking cessation.;Monitor prescription use compliance.    Expected Outcomes Short Term: Continued assessment and intervention until BP is < 140/71m HG in hypertensive participants. < 130/859mHG in hypertensive participants with diabetes, heart failure or chronic kidney disease.;Long Term: Maintenance of blood pressure at goal levels.    Lipids Yes    Intervention Provide education and support for participant on nutrition & aerobic/resistive exercise along with prescribed medications to achieve LDL '70mg'$ , HDL >'40mg'$ .    Expected Outcomes Short Term: Participant states understanding of desired cholesterol values and is compliant with medications prescribed. Participant is following exercise prescription and nutrition guidelines.;Long Term: Cholesterol controlled with medications as prescribed, with individualized exercise RX and with personalized nutrition plan. Value goals: LDL < '70mg'$ , HDL > 40 mg.             Education:Diabetes - Individual verbal and written instruction to review signs/symptoms of diabetes, desired ranges of glucose level fasting, after meals and with exercise. Acknowledge that pre and post exercise glucose checks will be done for 3 sessions at entry of program.   Core Components/Risk Factors/Patient Goals Review:    Core Components/Risk Factors/Patient Goals at Discharge (Final  Review):    ITP Comments:  ITP Comments     Row Name 03/18/22 0937 03/19/22 0933         ITP Comments Virtual Visit completed. Patient informed on EP and RD appointment and 6 Minute walk test. Patient also informed of patient health questionnaires on My Chart. Patient Verbalizes understanding. [facility] Completed 6MWT and gym  orientation. Initial ITP created and sent for review to Dr. Emily Filbert, Medical Director.               Comments: Initial ITP

## 2022-03-19 NOTE — Patient Instructions (Signed)
Patient Instructions  Patient Details  Name: Kelsey Klein MRN: [id number] Date of Birth: Jan 21, 1971 Referring Provider:  Nelva Bush, MD  Below are your personal goals for exercise, nutrition, and risk factors. Our goal is to help you stay on track towards obtaining and maintaining these goals. We will be discussing your progress on these goals with you throughout the program.  Initial Exercise Prescription:  Initial Exercise Prescription - 03/19/22 1000       Date of Initial Exercise RX and Referring Provider   Date 03/19/22    Referring Provider Nelva Bush, MD      Oxygen   Maintain Oxygen Saturation 88% or higher      Treadmill   MPH 3    Grade 1.5    Minutes 15    METs 3.92      REL-XR   Level 2    Minutes 15    METs 4.66      Prescription Details   Frequency (times per week) 2    Duration Progress to 30 minutes of continuous aerobic without signs/symptoms of physical distress      Intensity   THRR 40-80% of Max Heartrate 102-147    Ratings of Perceived Exertion 11-13    Perceived Dyspnea 0-4      Progression   Progression Continue to progress workloads to maintain intensity without signs/symptoms of physical distress.      Resistance Training   Training Prescription Yes    Weight 4 lb    Reps 10-15             Exercise Goals: Frequency: Be able to perform aerobic exercise two to three times per week in program working toward 2-5 days per week of home exercise.  Intensity: Work with a perceived exertion of 11 (fairly light) - 15 (hard) while following your exercise prescription.  We will make changes to your prescription with you as you progress through the program.   Duration: Be able to do 30 to 45 minutes of continuous aerobic exercise in addition to a 5 minute warm-up and a 5 minute cool-down routine.   Nutrition Goals: Your personal nutrition goals will be established when you do your nutrition analysis with the dietician.  The  following are general nutrition guidelines to follow: Cholesterol < '200mg'$ /day Sodium < '1500mg'$ /day Fiber: Women over 50 yrs - 21 grams per day  Personal Goals:  Personal Goals and Risk Factors at Admission - 03/19/22 0942       Core Components/Risk Factors/Patient Goals on Admission    Weight Management Yes;Weight Maintenance    Intervention Weight Management: Develop a combined nutrition and exercise program designed to reach desired caloric intake, while maintaining appropriate intake of nutrient and fiber, sodium and fats, and appropriate energy expenditure required for the weight goal.;Weight Management: Provide education and appropriate resources to help participant work on and attain dietary goals.    Admit Weight 154 lb 4.8 oz (70 kg)    Goal Weight: Short Term 154 lb 4.8 oz (70 kg)    Goal Weight: Long Term 154 lb 4.8 oz (70 kg)    Expected Outcomes Short Term: Continue to assess and modify interventions until short term weight is achieved;Long Term: Adherence to nutrition and physical activity/exercise program aimed toward attainment of established weight goal;Weight Maintenance: Understanding of the daily nutrition guidelines, which includes 25-35% calories from fat, 7% or less cal from saturated fats, less than '200mg'$  cholesterol, less than 1.5gm of sodium, & 5 or more servings  of fruits and vegetables daily;Understanding recommendations for meals to include 15-35% energy as protein, 25-35% energy from fat, 35-60% energy from carbohydrates, less than '200mg'$  of dietary cholesterol, 20-35 gm of total fiber daily;Understanding of distribution of calorie intake throughout the day with the consumption of 4-5 meals/snacks    Hypertension Yes    Intervention Provide education on lifestyle modifcations including regular physical activity/exercise, weight management, moderate sodium restriction and increased consumption of fresh fruit, vegetables, and low fat dairy, alcohol moderation, and smoking  cessation.;Monitor prescription use compliance.    Expected Outcomes Short Term: Continued assessment and intervention until BP is < 140/40m HG in hypertensive participants. < 130/894mHG in hypertensive participants with diabetes, heart failure or chronic kidney disease.;Long Term: Maintenance of blood pressure at goal levels.    Lipids Yes    Intervention Provide education and support for participant on nutrition & aerobic/resistive exercise along with prescribed medications to achieve LDL '70mg'$ , HDL >'40mg'$ .    Expected Outcomes Short Term: Participant states understanding of desired cholesterol values and is compliant with medications prescribed. Participant is following exercise prescription and nutrition guidelines.;Long Term: Cholesterol controlled with medications as prescribed, with individualized exercise RX and with personalized nutrition plan. Value goals: LDL < '70mg'$ , HDL > 40 mg.             Tobacco Use Initial Evaluation: Social History   Tobacco Use  Smoking Status Never  Smokeless Tobacco Never    Exercise Goals and Review:  Exercise Goals     Row Name 03/19/22 1014             Exercise Goals   Increase Physical Activity Yes       Intervention Develop an individualized exercise prescription for aerobic and resistive training based on initial evaluation findings, risk stratification, comorbidities and participant's personal goals.;Provide advice, education, support and counseling about physical activity/exercise needs.       Expected Outcomes Long Term: Add in home exercise to make exercise part of routine and to increase amount of physical activity.;Long Term: Exercising regularly at least 3-5 days a week.;Short Term: Attend rehab on a regular basis to increase amount of physical activity.       Increase Strength and Stamina Yes       Intervention Provide advice, education, support and counseling about physical activity/exercise needs.;Develop an individualized  exercise prescription for aerobic and resistive training based on initial evaluation findings, risk stratification, comorbidities and participant's personal goals.       Expected Outcomes Short Term: Perform resistance training exercises routinely during rehab and add in resistance training at home;Short Term: Increase workloads from initial exercise prescription for resistance, speed, and METs.;Long Term: Improve cardiorespiratory fitness, muscular endurance and strength as measured by increased METs and functional capacity (6MWT)       Able to understand and use rate of perceived exertion (RPE) scale Yes       Intervention Provide education and explanation on how to use RPE scale       Expected Outcomes Short Term: Able to use RPE daily in rehab to express subjective intensity level;Long Term:  Able to use RPE to guide intensity level when exercising independently       Able to understand and use Dyspnea scale Yes       Intervention Provide education and explanation on how to use Dyspnea scale       Expected Outcomes Short Term: Able to use Dyspnea scale daily in rehab to express subjective sense of shortness of  breath during exertion;Long Term: Able to use Dyspnea scale to guide intensity level when exercising independently       Knowledge and understanding of Target Heart Rate Range (THRR) Yes       Intervention Provide education and explanation of THRR including how the numbers were predicted and where they are located for reference       Expected Outcomes Short Term: Able to state/look up THRR;Long Term: Able to use THRR to govern intensity when exercising independently;Short Term: Able to use daily as guideline for intensity in rehab       Able to check pulse independently Yes       Intervention Provide education and demonstration on how to check pulse in carotid and radial arteries.;Review the importance of being able to check your own pulse for safety during independent exercise       Expected  Outcomes Short Term: Able to explain why pulse checking is important during independent exercise;Long Term: Able to check pulse independently and accurately       Understanding of Exercise Prescription Yes       Intervention Provide education, explanation, and written materials on patient's individual exercise prescription       Expected Outcomes Short Term: Able to explain program exercise prescription;Long Term: Able to explain home exercise prescription to exercise independently

## 2022-03-20 ENCOUNTER — Encounter: Payer: Self-pay | Admitting: *Deleted

## 2022-03-20 DIAGNOSIS — I214 Non-ST elevation (NSTEMI) myocardial infarction: Secondary | ICD-10-CM

## 2022-03-20 NOTE — Progress Notes (Signed)
Cardiac Individual Treatment Plan  Patient Details  Name: Kelsey Klein MRN: [id number] Date of Birth: 17-Apr-1971 Referring Provider:   Flowsheet Row Cardiac Rehab from 03/19/2022 in [facility] Cardiac and Pulmonary Rehab  Referring Provider Nelva Bush, MD       Initial Encounter Date:  Flowsheet Row Cardiac Rehab from 03/19/2022 in [facility] Cardiac and Pulmonary Rehab  Date 03/19/22       Visit Diagnosis: NSTEMI (non-ST elevated myocardial infarction) [facility])  Patient's Home Medications on Admission:  Current Outpatient Medications:    Adapalene 0.3 % gel, SMARTSIG:sparingly Topical Every Night, Disp: , Rfl:    anastrozole (ARIMIDEX) 1 MG tablet, Take 1 tablet (1 mg total) by mouth daily., Disp: 90 tablet, Rfl: 3   aspirin EC 81 MG tablet, Take 1 tablet (81 mg total) by mouth daily. Swallow whole., Disp: 30 tablet, Rfl: 12   metoprolol tartrate (LOPRESSOR) 25 MG tablet, Take 0.5 tablets (12.5 mg total) by mouth 2 (two) times daily., Disp: 60 tablet, Rfl: 2   nitroGLYCERIN (NITROSTAT) 0.4 MG SL tablet, Place 1 tablet (0.4 mg total) under the tongue every 5 (five) minutes x 3 doses as needed for chest pain., Disp: 30 tablet, Rfl: 0   rosuvastatin (CRESTOR) 20 MG tablet, Take 1 tablet (20 mg total) by mouth daily., Disp: 30 tablet, Rfl: 6   ticagrelor (BRILINTA) 90 MG TABS tablet, Take 1 tablet (90 mg total) by mouth 2 (two) times daily., Disp: 60 tablet, Rfl: 2  Past Medical History: Past Medical History:  Diagnosis Date   Breast cancer, stage 1, estrogen receptor negative, right ([facility]) 08/19/2015    18 mm; pT1c  pN0 (0/6nodes positive). ER/PR +; Her 2 neu not overexpressed   Family history of breast cancer     Tobacco Use: Social History   Tobacco Use  Smoking Status Never  Smokeless Tobacco Never    Labs: Review Flowsheet       Latest Ref Rng & Units 10/31/2021  Labs for ITP Cardiac and Pulmonary Rehab  Cholestrol 0 - 200 mg/dL 158   LDL (calc) 0 - 99 mg/dL 68   HDL-C  >40 mg/dL 82   Trlycerides <150 mg/dL 38   Hemoglobin A1c 4.8 - 5.6 % 5.8      Exercise Target Goals: Exercise Program Goal: Individual exercise prescription set using results from initial 6 min walk test and THRR while considering  patient's activity barriers and safety.   Exercise Prescription Goal: Initial exercise prescription builds to 30-45 minutes a day of aerobic activity, 2-3 days per week.  Home exercise guidelines will be given to patient during program as part of exercise prescription that the participant will acknowledge.   Education: Aerobic Exercise: - Group verbal and visual presentation on the components of exercise prescription. Introduces F.I.T.T principle from ACSM for exercise prescriptions.  Reviews F.I.T.T. principles of aerobic exercise including progression. Written material given at graduation. Flowsheet Row Cardiac Rehab from 03/19/2022 in [facility] Cardiac and Pulmonary Rehab  Education need identified 03/19/22       Education: Resistance Exercise: - Group verbal and visual presentation on the components of exercise prescription. Introduces F.I.T.T principle from ACSM for exercise prescriptions  Reviews F.I.T.T. principles of resistance exercise including progression. Written material given at graduation.    Education: Exercise & Equipment Safety: - Individual verbal instruction and demonstration of equipment use and safety with use of the equipment. Flowsheet Row Cardiac Rehab from 03/19/2022 in [facility] Cardiac and Pulmonary Rehab  Date 03/18/22  Educator [facility]  Instruction Review Code 1- Verbalizes Understanding       Education: Exercise Physiology & General Exercise Guidelines: - Group verbal and written instruction with models to review the exercise physiology of the cardiovascular system and associated critical values. Provides general exercise guidelines with specific guidelines to those with heart or lung disease.    Education: Flexibility, Balance,  Mind/Body Relaxation: - Group verbal and visual presentation with interactive activity on the components of exercise prescription. Introduces F.I.T.T principle from ACSM for exercise prescriptions. Reviews F.I.T.T. principles of flexibility and balance exercise training including progression. Also discusses the mind body connection.  Reviews various relaxation techniques to help reduce and manage stress (i.e. Deep breathing, progressive muscle relaxation, and visualization). Balance handout provided to take home. Written material given at graduation.   Activity Barriers & Risk Stratification:  Activity Barriers & Cardiac Risk Stratification - 03/19/22 0946       Activity Barriers & Cardiac Risk Stratification   Activity Barriers None    Cardiac Risk Stratification High             6 Minute Walk:  6 Minute Walk     Row Name 03/19/22 0945         6 Minute Walk   Phase Initial     Distance 1565 feet     Walk Time 6 minutes     # of Rest Breaks 0     MPH 2.96     METS 4.66     RPE 8     Perceived Dyspnea  0     VO2 Peak 16.32     Symptoms No     Resting HR 57 bpm     Resting BP 124/78     Resting Oxygen Saturation  99 %     Exercise Oxygen Saturation  during 6 min walk 100 %     Max Ex. HR 118 bpm     Max Ex. BP 128/84     2 Minute Post BP 116/70              Oxygen Initial Assessment:   Oxygen Re-Evaluation:   Oxygen Discharge (Final Oxygen Re-Evaluation):   Initial Exercise Prescription:  Initial Exercise Prescription - 03/19/22 1000       Date of Initial Exercise RX and Referring Provider   Date 03/19/22    Referring Provider Nelva Bush, MD      Oxygen   Maintain Oxygen Saturation 88% or higher      Treadmill   MPH 3    Grade 1.5    Minutes 15    METs 3.92      REL-XR   Level 2    Minutes 15    METs 4.66      Prescription Details   Frequency (times per week) 2    Duration Progress to 30 minutes of continuous aerobic without  signs/symptoms of physical distress      Intensity   THRR 40-80% of Max Heartrate 102-147    Ratings of Perceived Exertion 11-13    Perceived Dyspnea 0-4      Progression   Progression Continue to progress workloads to maintain intensity without signs/symptoms of physical distress.      Resistance Training   Training Prescription Yes    Weight 4 lb    Reps 10-15             Perform Capillary Blood Glucose checks as needed.  Exercise Prescription Changes:   Exercise Prescription Changes  Row Name 03/19/22 1000             Response to Exercise   Blood Pressure (Admit) 124/78       Blood Pressure (Exercise) 128/84       Blood Pressure (Exit) 116/70       Heart Rate (Admit) 57 bpm       Heart Rate (Exercise) 118 bpm       Heart Rate (Exit) 56 bpm       Oxygen Saturation (Admit) 99 %       Oxygen Saturation (Exercise) 100 %       Rating of Perceived Exertion (Exercise) 8       Perceived Dyspnea (Exercise) 0       Symptoms none       Comments 6MWT Results                Exercise Comments:   Exercise Goals and Review:   Exercise Goals     Row Name 03/19/22 1014             Exercise Goals   Increase Physical Activity Yes       Intervention Develop an individualized exercise prescription for aerobic and resistive training based on initial evaluation findings, risk stratification, comorbidities and participant's personal goals.;Provide advice, education, support and counseling about physical activity/exercise needs.       Expected Outcomes Long Term: Add in home exercise to make exercise part of routine and to increase amount of physical activity.;Long Term: Exercising regularly at least 3-5 days a week.;Short Term: Attend rehab on a regular basis to increase amount of physical activity.       Increase Strength and Stamina Yes       Intervention Provide advice, education, support and counseling about physical activity/exercise needs.;Develop an  individualized exercise prescription for aerobic and resistive training based on initial evaluation findings, risk stratification, comorbidities and participant's personal goals.       Expected Outcomes Short Term: Perform resistance training exercises routinely during rehab and add in resistance training at home;Short Term: Increase workloads from initial exercise prescription for resistance, speed, and METs.;Long Term: Improve cardiorespiratory fitness, muscular endurance and strength as measured by increased METs and functional capacity (6MWT)       Able to understand and use rate of perceived exertion (RPE) scale Yes       Intervention Provide education and explanation on how to use RPE scale       Expected Outcomes Short Term: Able to use RPE daily in rehab to express subjective intensity level;Long Term:  Able to use RPE to guide intensity level when exercising independently       Able to understand and use Dyspnea scale Yes       Intervention Provide education and explanation on how to use Dyspnea scale       Expected Outcomes Short Term: Able to use Dyspnea scale daily in rehab to express subjective sense of shortness of breath during exertion;Long Term: Able to use Dyspnea scale to guide intensity level when exercising independently       Knowledge and understanding of Target Heart Rate Range (THRR) Yes       Intervention Provide education and explanation of THRR including how the numbers were predicted and where they are located for reference       Expected Outcomes Short Term: Able to state/look up THRR;Long Term: Able to use THRR to govern intensity when exercising independently;Short Term: Able to use daily as  guideline for intensity in rehab       Able to check pulse independently Yes       Intervention Provide education and demonstration on how to check pulse in carotid and radial arteries.;Review the importance of being able to check your own pulse for safety during independent exercise        Expected Outcomes Short Term: Able to explain why pulse checking is important during independent exercise;Long Term: Able to check pulse independently and accurately       Understanding of Exercise Prescription Yes       Intervention Provide education, explanation, and written materials on patient's individual exercise prescription       Expected Outcomes Short Term: Able to explain program exercise prescription;Long Term: Able to explain home exercise prescription to exercise independently                Exercise Goals Re-Evaluation :   Discharge Exercise Prescription (Final Exercise Prescription Changes):  Exercise Prescription Changes - 03/19/22 1000       Response to Exercise   Blood Pressure (Admit) 124/78    Blood Pressure (Exercise) 128/84    Blood Pressure (Exit) 116/70    Heart Rate (Admit) 57 bpm    Heart Rate (Exercise) 118 bpm    Heart Rate (Exit) 56 bpm    Oxygen Saturation (Admit) 99 %    Oxygen Saturation (Exercise) 100 %    Rating of Perceived Exertion (Exercise) 8    Perceived Dyspnea (Exercise) 0    Symptoms none    Comments 6MWT Results             Nutrition:  Target Goals: Understanding of nutrition guidelines, daily intake of sodium '1500mg'$ , cholesterol '200mg'$ , calories 30% from fat and 7% or less from saturated fats, daily to have 5 or more servings of fruits and vegetables.  Education: All About Nutrition: -Group instruction provided by verbal, written material, interactive activities, discussions, models, and posters to present general guidelines for heart healthy nutrition including fat, fiber, MyPlate, the role of sodium in heart healthy nutrition, utilization of the nutrition label, and utilization of this knowledge for meal planning. Follow up email sent as well. Written material given at graduation. Flowsheet Row Cardiac Rehab from 03/19/2022 in [facility] Cardiac and Pulmonary Rehab  Education need identified 03/19/22       Biometrics:   Pre Biometrics - 03/19/22 0944       Pre Biometrics   Height '5\' 5"'$  (1.651 m)    Weight 154 lb 4.8 oz (70 kg)    Waist Circumference 30.5 inches    Hip Circumference 37 inches    Waist to Hip Ratio 0.82 %    BMI (Calculated) 25.68    Single Leg Stand 30 seconds              Nutrition Therapy Plan and Nutrition Goals:  Nutrition Therapy & Goals - 03/19/22 1012       Nutrition Therapy   Diet Heart healthy, low Na    Drug/Food Interactions Statins/Certain Fruits    Protein (specify units) 55-60g    Fiber 25 grams    Whole Grain Foods 3 servings    Saturated Fats 14 max. grams    Fruits and Vegetables 8 servings/day    Sodium 2 grams      Personal Nutrition Goals   Nutrition Goal ST: Practice meal planning/using tactics to cut down on prep time, practice MyPlate guidelines LT: limit eating out <2-3x/week for dinner, include 25g  of fiber/day    Comments 51 y.o. F admitted to Cardiac Rehab s/p NSTEMI. PMHx includes breast cancer s/p mastectomy. PSHx includes stent placement (2023). Relevant medications include crestor, anastrozole. Kelsey Klein reports doing well overall with her nutrition, however, she does go out to eat for most meals and will use greek dressing and ranch with her salad at lunch - she feels if she cooked more at home, it would be healthier for her.  B: protein shake and/or oatmeal in the monring L: salad with grilled chicken from [facility] D: sometimes just chips or she goes out to eat, she reports cooking at home 1-2x/week where she makes things like pasta, fish, and shrimp. She reports she has cut down on the amount of potatoes she will have, limits red meat, does not add salt after cooking, and uses olive oil for cooking. Discussed heart healthy eating and how to lower LDL cholesterol. Kelsey Klein reports the major barrier for her to cook at home is time; discussed options to cut down on cooking time such as ingredient prep and utilizing food in different ways during the week  i.e. spinach could be used to make a quick salad or as an easy side to a meal. Provided handouts and encouraged her to practice these skills.      Intervention Plan   Intervention Prescribe, educate and counsel regarding individualized specific dietary modifications aiming towards targeted core components such as weight, hypertension, lipid management, diabetes, heart failure and other comorbidities.;Nutrition handout(s) given to patient.    Expected Outcomes Short Term Goal: Understand basic principles of dietary content, such as calories, fat, sodium, cholesterol and nutrients.;Short Term Goal: A plan has been developed with personal nutrition goals set during dietitian appointment.;Long Term Goal: Adherence to prescribed nutrition plan.             Nutrition Assessments:  MEDIFICTS Score Key: ?70 Need to make dietary changes  40-70 Heart Healthy Diet ? 40 Therapeutic Level Cholesterol Diet  Flowsheet Row Cardiac Rehab from 03/19/2022 in [facility] Cardiac and Pulmonary Rehab  Picture Your Plate Total Score on Admission 64      Picture Your Plate Scores: <54 Unhealthy dietary pattern with much room for improvement. 41-50 Dietary pattern unlikely to meet recommendations for good health and room for improvement. 51-60 More healthful dietary pattern, with some room for improvement.  >60 Healthy dietary pattern, although there may be some specific behaviors that could be improved.    Nutrition Goals Re-Evaluation:   Nutrition Goals Discharge (Final Nutrition Goals Re-Evaluation):   Psychosocial: Target Goals: Acknowledge presence or absence of significant depression and/or stress, maximize coping skills, provide positive support system. Participant is able to verbalize types and ability to use techniques and skills needed for reducing stress and depression.   Education: Stress, Anxiety, and Depression - Group verbal and visual presentation to define topics covered.  Reviews how body  is impacted by stress, anxiety, and depression.  Also discusses healthy ways to reduce stress and to treat/manage anxiety and depression.  Written material given at graduation.   Education: Sleep Hygiene -Provides group verbal and written instruction about how sleep can affect your health.  Define sleep hygiene, discuss sleep cycles and impact of sleep habits. Review good sleep hygiene tips.    Initial Review & Psychosocial Screening:  Initial Psych Review & Screening - 03/18/22 0939       Initial Review   Current issues with Current Sleep Concerns      Family Dynamics   Good Support  System? Yes    Comments Kelsey Klein has sleep concerns and does not sleep well. She has good family support system and looks to her mother, sister and daughter.      Barriers   Psychosocial barriers to participate in program There are no identifiable barriers or psychosocial needs.;The patient should benefit from training in stress management and relaxation.      Screening Interventions   Interventions Encouraged to exercise;To provide support and resources with identified psychosocial needs;Provide feedback about the scores to participant    Expected Outcomes Short Term goal: Utilizing psychosocial counselor, staff and physician to assist with identification of specific Stressors or current issues interfering with healing process. Setting desired goal for each stressor or current issue identified.;Long Term Goal: Stressors or current issues are controlled or eliminated.;Short Term goal: Identification and review with participant of any Quality of Life or Depression concerns found by scoring the questionnaire.;Long Term goal: The participant improves quality of Life and PHQ9 Scores as seen by post scores and/or verbalization of changes             Quality of Life Scores:   Quality of Life - 03/19/22 0942       Quality of Life   Select Quality of Life      Quality of Life Scores   Health/Function Pre  21.7 %    Socioeconomic Pre 19.86 %    Psych/Spiritual Pre 22 %    Family Pre 22.8 %    GLOBAL Pre 21.54 %            Scores of 19 and below usually indicate a poorer quality of life in these areas.  A difference of  2-3 points is a clinically meaningful difference.  A difference of 2-3 points in the total score of the Quality of Life Index has been associated with significant improvement in overall quality of life, self-image, physical symptoms, and general health in studies assessing change in quality of life.  PHQ-9: Review Flowsheet       03/19/2022  Depression screen PHQ 2/9  Decreased Interest 1  Down, Depressed, Hopeless 0  PHQ - 2 Score 1  Altered sleeping 3  Tired, decreased energy 1  Change in appetite 0  Feeling bad or failure about yourself  0  Trouble concentrating 1  Moving slowly or fidgety/restless 0  Suicidal thoughts 0  PHQ-9 Score 6  Difficult doing work/chores Not difficult at all   Interpretation of Total Score  Total Score Depression Severity:  1-4 = Minimal depression, 5-9 = Mild depression, 10-14 = Moderate depression, 15-19 = Moderately severe depression, 20-27 = Severe depression   Psychosocial Evaluation and Intervention:  Psychosocial Evaluation - 03/18/22 0940       Psychosocial Evaluation & Interventions   Interventions Encouraged to exercise with the program and follow exercise prescription;Relaxation education;Stress management education    Comments Kelsey Klein has sleep concerns and does not sleep well. She has good family support system and looks to her mother, sister and daughter.    Expected Outcomes Short: Start HeartTrack to help with mood. Long: Maintain a healthy mental state    Continue Psychosocial Services  Follow up required by staff             Psychosocial Re-Evaluation:   Psychosocial Discharge (Final Psychosocial Re-Evaluation):   Vocational Rehabilitation: Provide vocational rehab assistance to qualifying  candidates.   Vocational Rehab Evaluation & Intervention:   Education: Education Goals: Education classes will be provided on a variety of  topics geared toward better understanding of heart health and risk factor modification. Participant will state understanding/return demonstration of topics presented as noted by education test scores.  Learning Barriers/Preferences:  Learning Barriers/Preferences - 03/18/22 5027       Learning Barriers/Preferences   Learning Barriers None    Learning Preferences None             General Cardiac Education Topics:  AED/CPR: - Group verbal and written instruction with the use of models to demonstrate the basic use of the AED with the basic ABC's of resuscitation.   Anatomy and Cardiac Procedures: - Group verbal and visual presentation and models provide information about basic cardiac anatomy and function. Reviews the testing methods done to diagnose heart disease and the outcomes of the test results. Describes the treatment choices: Medical Management, Angioplasty, or Coronary Bypass Surgery for treating various heart conditions including Myocardial Infarction, Angina, Valve Disease, and Cardiac Arrhythmias.  Written material given at graduation.   Medication Safety: - Group verbal and visual instruction to review commonly prescribed medications for heart and lung disease. Reviews the medication, class of the drug, and side effects. Includes the steps to properly store meds and maintain the prescription regimen.  Written material given at graduation.   Intimacy: - Group verbal instruction through game format to discuss how heart and lung disease can affect sexual intimacy. Written material given at graduation..   Know Your Numbers and Heart Failure: - Group verbal and visual instruction to discuss disease risk factors for cardiac and pulmonary disease and treatment options.  Reviews associated critical values for Overweight/Obesity,  Hypertension, Cholesterol, and Diabetes.  Discusses basics of heart failure: signs/symptoms and treatments.  Introduces Heart Failure Zone chart for action plan for heart failure.  Written material given at graduation.   Infection Prevention: - Provides verbal and written material to individual with discussion of infection control including proper hand washing and proper equipment cleaning during exercise session. Flowsheet Row Cardiac Rehab from 03/19/2022 in [facility] Cardiac and Pulmonary Rehab  Date 03/18/22  Educator [facility]  Instruction Review Code 1- Verbalizes Understanding       Falls Prevention: - Provides verbal and written material to individual with discussion of falls prevention and safety. Flowsheet Row Cardiac Rehab from 03/19/2022 in [facility] Cardiac and Pulmonary Rehab  Date 03/18/22  Educator [facility]  Instruction Review Code 1- Verbalizes Understanding       Other: -Provides group and verbal instruction on various topics (see comments)   Knowledge Questionnaire Score:  Knowledge Questionnaire Score - 03/19/22 0942       Knowledge Questionnaire Score   Pre Score 24/26             Core Components/Risk Factors/Patient Goals at Admission:  Personal Goals and Risk Factors at Admission - 03/19/22 0942       Core Components/Risk Factors/Patient Goals on Admission    Weight Management Yes;Weight Maintenance    Intervention Weight Management: Develop a combined nutrition and exercise program designed to reach desired caloric intake, while maintaining appropriate intake of nutrient and fiber, sodium and fats, and appropriate energy expenditure required for the weight goal.;Weight Management: Provide education and appropriate resources to help participant work on and attain dietary goals.    Admit Weight 154 lb 4.8 oz (70 kg)    Goal Weight: Short Term 154 lb 4.8 oz (70 kg)    Goal Weight: Long Term 154 lb 4.8 oz (70 kg)    Expected Outcomes Short Term: Continue to assess and  modify interventions until short term weight is achieved;Long Term: Adherence to nutrition and physical activity/exercise program aimed toward attainment of established weight goal;Weight Maintenance: Understanding of the daily nutrition guidelines, which includes 25-35% calories from fat, 7% or less cal from saturated fats, less than '200mg'$  cholesterol, less than 1.5gm of sodium, & 5 or more servings of fruits and vegetables daily;Understanding recommendations for meals to include 15-35% energy as protein, 25-35% energy from fat, 35-60% energy from carbohydrates, less than '200mg'$  of dietary cholesterol, 20-35 gm of total fiber daily;Understanding of distribution of calorie intake throughout the day with the consumption of 4-5 meals/snacks    Hypertension Yes    Intervention Provide education on lifestyle modifcations including regular physical activity/exercise, weight management, moderate sodium restriction and increased consumption of fresh fruit, vegetables, and low fat dairy, alcohol moderation, and smoking cessation.;Monitor prescription use compliance.    Expected Outcomes Short Term: Continued assessment and intervention until BP is < 140/72m HG in hypertensive participants. < 130/851mHG in hypertensive participants with diabetes, heart failure or chronic kidney disease.;Long Term: Maintenance of blood pressure at goal levels.    Lipids Yes    Intervention Provide education and support for participant on nutrition & aerobic/resistive exercise along with prescribed medications to achieve LDL '70mg'$ , HDL >'40mg'$ .    Expected Outcomes Short Term: Participant states understanding of desired cholesterol values and is compliant with medications prescribed. Participant is following exercise prescription and nutrition guidelines.;Long Term: Cholesterol controlled with medications as prescribed, with individualized exercise RX and with personalized nutrition plan. Value goals: LDL < '70mg'$ , HDL > 40 mg.              Education:Diabetes - Individual verbal and written instruction to review signs/symptoms of diabetes, desired ranges of glucose level fasting, after meals and with exercise. Acknowledge that pre and post exercise glucose checks will be done for 3 sessions at entry of program.   Core Components/Risk Factors/Patient Goals Review:    Core Components/Risk Factors/Patient Goals at Discharge (Final Review):    ITP Comments:  ITP Comments     Row Name 03/18/22 0937 03/19/22 0933 03/20/22 0725       ITP Comments Virtual Visit completed. Patient informed on EP and RD appointment and 6 Minute walk test. Patient also informed of patient health questionnaires on My Chart. Patient Verbalizes understanding. [facility]. Completed 6MWT and gym orientation. Initial ITP created and sent for review to Dr. MaEmily FilbertMedical Director. 30 Day review completed. Medical Director ITP review done, changes made as directed, and signed approval by Medical Director.   New to program              Comments:

## 2022-03-26 ENCOUNTER — Ambulatory Visit: Payer: Medicaid Other | Admitting: Medical

## 2022-04-02 ENCOUNTER — Encounter: Payer: Medicaid Other | Admitting: *Deleted

## 2022-04-02 DIAGNOSIS — Z955 Presence of coronary angioplasty implant and graft: Secondary | ICD-10-CM

## 2022-04-02 DIAGNOSIS — I252 Old myocardial infarction: Secondary | ICD-10-CM | POA: Diagnosis not present

## 2022-04-02 DIAGNOSIS — I214 Non-ST elevation (NSTEMI) myocardial infarction: Secondary | ICD-10-CM

## 2022-04-02 NOTE — Progress Notes (Signed)
Daily Session Note  Patient Details  Name: Kelsey Klein MRN: [id number] Date of Birth: 1971-01-06 Referring Provider:   Flowsheet Row Cardiac Rehab from 03/19/2022 in [facility] Cardiac and Pulmonary Rehab  Referring Provider Nelva Bush, MD       Encounter Date: 04/02/2022  Check In:  Session Check In - 04/02/22 0810       Check-In   Supervising physician immediately available to respond to emergencies See telemetry face sheet for immediately available ER MD    Location [facility]-Cardiac & Pulmonary Rehab    Staff Present Heath Lark, RN, BSN, CCRP;Jessica Sunsites, MA, RCEP, CCRP, Marylynn Pearson, MS, ASCM CEP, Exercise Physiologist    Virtual Visit No    Medication changes reported     No    Fall or balance concerns reported    No    Warm-up and Cool-down Performed on first and last piece of equipment    Resistance Training Performed Yes    VAD Patient? No    PAD/SET Patient? No      Pain Assessment   Currently in Pain? No/denies                Social History   Tobacco Use  Smoking Status Never  Smokeless Tobacco Never    Goals Met:  Exercise tolerated well Personal goals reviewed No report of concerns or symptoms today  Goals Unmet:  Not Applicable  Comments: First full day of exercise!  Patient was oriented to gym and equipment including functions, settings, policies, and procedures.  Patient's individual exercise prescription and treatment plan were reviewed.  All starting workloads were established based on the results of the 6 minute walk test done at initial orientation visit.  The plan for exercise progression was also introduced and progression will be customized based on patient's performance and goals.    Dr. Emily Filbert is Medical Director for [place].  Dr. Ottie Glazier is Medical Director for [facility] Pulmonary Rehabilitation.

## 2022-04-03 ENCOUNTER — Encounter: Payer: Medicaid Other | Admitting: *Deleted

## 2022-04-03 DIAGNOSIS — I252 Old myocardial infarction: Secondary | ICD-10-CM | POA: Diagnosis not present

## 2022-04-03 DIAGNOSIS — I214 Non-ST elevation (NSTEMI) myocardial infarction: Secondary | ICD-10-CM

## 2022-04-03 NOTE — Progress Notes (Signed)
Daily Session Note  Patient Details  Name: Kelsey Klein MRN: [id number] Date of Birth: 1971-02-22 Referring Provider:   Flowsheet Row Cardiac Rehab from 03/19/2022 in [facility] Cardiac and Pulmonary Rehab  Referring Provider Nelva Bush, MD       Encounter Date: 04/03/2022  Check In:  Session Check In - 04/03/22 1711       Check-In   Supervising physician immediately available to respond to emergencies See telemetry face sheet for immediately available ER MD    Location [facility]-Cardiac & Pulmonary Rehab    Staff Present Earlean Shawl, BS, ACSM CEP, Exercise Physiologist;Laurena Valko Tamala Julian, RN, ADN;Meredith Sherryll Burger, RN BSN    Virtual Visit No    Medication changes reported     No    Fall or balance concerns reported    No    Warm-up and Cool-down Performed on first and last piece of equipment    Resistance Training Performed Yes    VAD Patient? No    PAD/SET Patient? No      Pain Assessment   Currently in Pain? No/denies                Social History   Tobacco Use  Smoking Status Never  Smokeless Tobacco Never    Goals Met:  Independence with exercise equipment Exercise tolerated well No report of concerns or symptoms today Strength training completed today  Goals Unmet:  Not Applicable  Comments: Pt able to follow exercise prescription today without complaint.  Will continue to monitor for progression.    Dr. Emily Filbert is Medical Director for [place].  Dr. Ottie Glazier is Medical Director for [facility] Pulmonary Rehabilitation.

## 2022-04-08 ENCOUNTER — Encounter: Payer: Self-pay | Admitting: *Deleted

## 2022-04-08 DIAGNOSIS — Z955 Presence of coronary angioplasty implant and graft: Secondary | ICD-10-CM

## 2022-04-08 DIAGNOSIS — I214 Non-ST elevation (NSTEMI) myocardial infarction: Secondary | ICD-10-CM

## 2022-04-08 NOTE — Progress Notes (Signed)
Pt called to let us know that she would like to discharge from the program.  She has a gym membership and feels that she can keep up with her exercise there.  We discussed monitoring her heart rate for exercise as well.  We will discharge her from the program at this time. She completed 4 sessions.    [place] Name 03/19/22 0945         6 Minute Walk   Phase Initial     Distance 1565 feet     Walk Time 6 minutes     # of Rest Breaks 0     MPH 2.96     METS 4.66     RPE 8     Perceived Dyspnea  0     VO2 Peak 16.32     Symptoms No     Resting HR 57 bpm     Resting BP 124/78     Resting Oxygen Saturation  99 %     Exercise Oxygen Saturation  during 6 min walk 100 %     Max Ex. HR 118 bpm     Max Ex. BP 128/84     2 Minute Post BP 116/70

## 2022-04-08 NOTE — Progress Notes (Signed)
Cardiac Individual Treatment Plan  Patient Details  Name: Kelsey Klein MRN: [id number] Date of Birth: [birth year] Referring Provider:   Flowsheet Row Cardiac Rehab from 03/19/2022 in [facility] Cardiac and Pulmonary Rehab  Referring Provider Nelva Bush, MD       Initial Encounter Date:  Flowsheet Row Cardiac Rehab from 03/19/2022 in [facility] Cardiac and Pulmonary Rehab  Date 03/19/22       Visit Diagnosis: NSTEMI (non-ST elevated myocardial infarction) [facility])  Status post coronary artery stent placement  Patient's Home Medications on Admission:  Current Outpatient Medications:    Adapalene 0.3 % gel, SMARTSIG:sparingly Topical Every Night, Disp: , Rfl:    anastrozole (ARIMIDEX) 1 MG tablet, Take 1 tablet (1 mg total) by mouth daily., Disp: 90 tablet, Rfl: 3   aspirin EC 81 MG tablet, Take 1 tablet (81 mg total) by mouth daily. Swallow whole., Disp: 30 tablet, Rfl: 12   metoprolol tartrate (LOPRESSOR) 25 MG tablet, Take 0.5 tablets (12.5 mg total) by mouth 2 (two) times daily., Disp: 60 tablet, Rfl: 2   nitroGLYCERIN (NITROSTAT) 0.4 MG SL tablet, Place 1 tablet (0.4 mg total) under the tongue every 5 (five) minutes x 3 doses as needed for chest pain., Disp: 30 tablet, Rfl: 0   rosuvastatin (CRESTOR) 20 MG tablet, Take 1 tablet (20 mg total) by mouth daily., Disp: 30 tablet, Rfl: 6   ticagrelor (BRILINTA) 90 MG TABS tablet, Take 1 tablet (90 mg total) by mouth 2 (two) times daily., Disp: 60 tablet, Rfl: 2  Past Medical History: Past Medical History:  Diagnosis Date   Breast cancer, stage 1, estrogen receptor negative, right ([place]) 08/19/2015    18 mm; pT1c  pN0 (0/6nodes positive). ER/PR +; Her 2 neu not overexpressed   Family history of breast cancer     Tobacco Use: Social History   Tobacco Use  Smoking Status Never  Smokeless Tobacco Never    Labs: Review Flowsheet       Latest Ref Rng & Units 10/31/2021  Labs for ITP Cardiac and Pulmonary Rehab  Cholestrol 0 - 200  mg/dL 158   LDL (calc) 0 - 99 mg/dL 68   HDL-C >40 mg/dL 82   Trlycerides <150 mg/dL 38   Hemoglobin A1c 4.8 - 5.6 % 5.8      Exercise Target Goals: Exercise Program Goal: Individual exercise prescription set using results from initial 6 min walk test and THRR while considering  patient's activity barriers and safety.   Exercise Prescription Goal: Initial exercise prescription builds to 30-45 minutes a day of aerobic activity, 2-3 days per week.  Home exercise guidelines will be given to patient during program as part of exercise prescription that the participant will acknowledge.   Education: Aerobic Exercise: - Group verbal and visual presentation on the components of exercise prescription. Introduces F.I.T.T principle from ACSM for exercise prescriptions.  Reviews F.I.T.T. principles of aerobic exercise including progression. Written material given at graduation. Flowsheet Row Cardiac Rehab from 04/03/2022 in [facility] Cardiac and Pulmonary Rehab  Education need identified 03/19/22       Education: Resistance Exercise: - Group verbal and visual presentation on the components of exercise prescription. Introduces F.I.T.T principle from ACSM for exercise prescriptions  Reviews F.I.T.T. principles of resistance exercise including progression. Written material given at graduation.    Education: Exercise & Equipment Safety: - Individual verbal instruction and demonstration of equipment use and safety with use of the equipment. Flowsheet Row Cardiac Rehab from 04/03/2022 in [facility] Cardiac and Pulmonary Rehab  Date 03/18/22  Educator [facility]  Instruction Review Code 1- Verbalizes Understanding       Education: Exercise Physiology & General Exercise Guidelines: - Group verbal and written instruction with models to review the exercise physiology of the cardiovascular system and associated critical values. Provides general exercise guidelines with specific guidelines to those with heart or lung  disease.    Education: Flexibility, Balance, Mind/Body Relaxation: - Group verbal and visual presentation with interactive activity on the components of exercise prescription. Introduces F.I.T.T principle from ACSM for exercise prescriptions. Reviews F.I.T.T. principles of flexibility and balance exercise training including progression. Also discusses the mind body connection.  Reviews various relaxation techniques to help reduce and manage stress (i.e. Deep breathing, progressive muscle relaxation, and visualization). Balance handout provided to take home. Written material given at graduation.   Activity Barriers & Risk Stratification:  Activity Barriers & Cardiac Risk Stratification - 03/19/22 0946       Activity Barriers & Cardiac Risk Stratification   Activity Barriers None    Cardiac Risk Stratification High             6 Minute Walk:  6 Minute Walk     Row Name 03/19/22 0945         6 Minute Walk   Phase Initial     Distance 1565 feet     Walk Time 6 minutes     # of Rest Breaks 0     MPH 2.96     METS 4.66     RPE 8     Perceived Dyspnea  0     VO2 Peak 16.32     Symptoms No     Resting HR 57 bpm     Resting BP 124/78     Resting Oxygen Saturation  99 %     Exercise Oxygen Saturation  during 6 min walk 100 %     Max Ex. HR 118 bpm     Max Ex. BP 128/84     2 Minute Post BP 116/70              Oxygen Initial Assessment:   Oxygen Re-Evaluation:   Oxygen Discharge (Final Oxygen Re-Evaluation):   Initial Exercise Prescription:  Initial Exercise Prescription - 03/19/22 1000       Date of Initial Exercise RX and Referring Provider   Date 03/19/22    Referring Provider Nelva Bush, MD      Oxygen   Maintain Oxygen Saturation 88% or higher      Treadmill   MPH 3    Grade 1.5    Minutes 15    METs 3.92      REL-XR   Level 2    Minutes 15    METs 4.66      Prescription Details   Frequency (times per week) 2    Duration Progress to  30 minutes of continuous aerobic without signs/symptoms of physical distress      Intensity   THRR 40-80% of Max Heartrate 102-147    Ratings of Perceived Exertion 11-13    Perceived Dyspnea 0-4      Progression   Progression Continue to progress workloads to maintain intensity without signs/symptoms of physical distress.      Resistance Training   Training Prescription Yes    Weight 4 lb    Reps 10-15             Perform Capillary Blood Glucose checks as needed.  Exercise Prescription Changes:  Exercise Prescription Changes     Row Name 03/19/22 1000             Response to Exercise   Blood Pressure (Admit) 124/78       Blood Pressure (Exercise) 128/84       Blood Pressure (Exit) 116/70       Heart Rate (Admit) 57 bpm       Heart Rate (Exercise) 118 bpm       Heart Rate (Exit) 56 bpm       Oxygen Saturation (Admit) 99 %       Oxygen Saturation (Exercise) 100 %       Rating of Perceived Exertion (Exercise) 8       Perceived Dyspnea (Exercise) 0       Symptoms none       Comments 6MWT Results                Exercise Comments:   Exercise Comments     Row Name 04/02/22 0811           Exercise Comments First full day of exercise!  Patient was oriented to gym and equipment including functions, settings, policies, and procedures.  Patient's individual exercise prescription and treatment plan were reviewed.  All starting workloads were established based on the results of the 6 minute walk test done at initial orientation visit.  The plan for exercise progression was also introduced and progression will be customized based on patient's performance and goals.                Exercise Goals and Review:   Exercise Goals     Row Name 03/19/22 1014             Exercise Goals   Increase Physical Activity Yes       Intervention Develop an individualized exercise prescription for aerobic and resistive training based on initial evaluation findings,  risk stratification, comorbidities and participant's personal goals.;Provide advice, education, support and counseling about physical activity/exercise needs.       Expected Outcomes Long Term: Add in home exercise to make exercise part of routine and to increase amount of physical activity.;Long Term: Exercising regularly at least 3-5 days a week.;Short Term: Attend rehab on a regular basis to increase amount of physical activity.       Increase Strength and Stamina Yes       Intervention Provide advice, education, support and counseling about physical activity/exercise needs.;Develop an individualized exercise prescription for aerobic and resistive training based on initial evaluation findings, risk stratification, comorbidities and participant's personal goals.       Expected Outcomes Short Term: Perform resistance training exercises routinely during rehab and add in resistance training at home;Short Term: Increase workloads from initial exercise prescription for resistance, speed, and METs.;Long Term: Improve cardiorespiratory fitness, muscular endurance and strength as measured by increased METs and functional capacity (6MWT)       Able to understand and use rate of perceived exertion (RPE) scale Yes       Intervention Provide education and explanation on how to use RPE scale       Expected Outcomes Short Term: Able to use RPE daily in rehab to express subjective intensity level;Long Term:  Able to use RPE to guide intensity level when exercising independently       Able to understand and use Dyspnea scale Yes       Intervention Provide education and explanation on how to use Dyspnea scale  Expected Outcomes Short Term: Able to use Dyspnea scale daily in rehab to express subjective sense of shortness of breath during exertion;Long Term: Able to use Dyspnea scale to guide intensity level when exercising independently       Knowledge and understanding of Target Heart Rate Range (THRR) Yes        Intervention Provide education and explanation of THRR including how the numbers were predicted and where they are located for reference       Expected Outcomes Short Term: Able to state/look up THRR;Long Term: Able to use THRR to govern intensity when exercising independently;Short Term: Able to use daily as guideline for intensity in rehab       Able to check pulse independently Yes       Intervention Provide education and demonstration on how to check pulse in carotid and radial arteries.;Review the importance of being able to check your own pulse for safety during independent exercise       Expected Outcomes Short Term: Able to explain why pulse checking is important during independent exercise;Long Term: Able to check pulse independently and accurately       Understanding of Exercise Prescription Yes       Intervention Provide education, explanation, and written materials on patient's individual exercise prescription       Expected Outcomes Short Term: Able to explain program exercise prescription;Long Term: Able to explain home exercise prescription to exercise independently                Exercise Goals Re-Evaluation :  Exercise Goals Re-Evaluation     Row Name 04/02/22 0812             Exercise Goal Re-Evaluation   Exercise Goals Review Knowledge and understanding of Target Heart Rate Range (THRR);Able to understand and use rate of perceived exertion (RPE) scale;Understanding of Exercise Prescription;Able to understand and use Dyspnea scale       Comments Reviewed RPE scale, THR and program prescription with pt today.  Pt voiced understanding and was given a copy of goals to take home.       Expected Outcomes Short: Use RPE daily to regulate intensity.  Long: Follow program prescription in THR.                Discharge Exercise Prescription (Final Exercise Prescription Changes):  Exercise Prescription Changes - 03/19/22 1000       Response to Exercise   Blood Pressure  (Admit) 124/78    Blood Pressure (Exercise) 128/84    Blood Pressure (Exit) 116/70    Heart Rate (Admit) 57 bpm    Heart Rate (Exercise) 118 bpm    Heart Rate (Exit) 56 bpm    Oxygen Saturation (Admit) 99 %    Oxygen Saturation (Exercise) 100 %    Rating of Perceived Exertion (Exercise) 8    Perceived Dyspnea (Exercise) 0    Symptoms none    Comments 6MWT Results             Nutrition:  Target Goals: Understanding of nutrition guidelines, daily intake of sodium '1500mg'$ , cholesterol '200mg'$ , calories 30% from fat and 7% or less from saturated fats, daily to have 5 or more servings of fruits and vegetables.  Education: All About Nutrition: -Group instruction provided by verbal, written material, interactive activities, discussions, models, and posters to present general guidelines for heart healthy nutrition including fat, fiber, MyPlate, the role of sodium in heart healthy nutrition, utilization of the nutrition label, and utilization of  this knowledge for meal planning. Follow up email sent as well. Written material given at graduation. Flowsheet Row Cardiac Rehab from 04/03/2022 in [facility] Cardiac and Pulmonary Rehab  Education need identified 03/19/22       Biometrics:  Pre Biometrics - 03/19/22 0944       Pre Biometrics   Height '5\' 5"'$  (1.651 m)    Weight 154 lb 4.8 oz (70 kg)    Waist Circumference 30.5 inches    Hip Circumference 37 inches    Waist to Hip Ratio 0.82 %    BMI (Calculated) 25.68    Single Leg Stand 30 seconds              Nutrition Therapy Plan and Nutrition Goals:  Nutrition Therapy & Goals - 03/19/22 1012       Nutrition Therapy   Diet Heart healthy, low Na    Drug/Food Interactions Statins/Certain Fruits    Protein (specify units) 55-60g    Fiber 25 grams    Whole Grain Foods 3 servings    Saturated Fats 14 max. grams    Fruits and Vegetables 8 servings/day    Sodium 2 grams      Personal Nutrition Goals   Nutrition Goal ST: Practice  meal planning/using tactics to cut down on prep time, practice MyPlate guidelines LT: limit eating out <2-3x/week for dinner, include 25g of fiber/day    Comments 50 y.o. F admitted to Cardiac Rehab s/p NSTEMI. PMHx includes breast cancer s/p mastectomy. PSHx includes stent placement (2023). Relevant medications include crestor, anastrozole. Daje reports doing well overall with her nutrition, however, she does go out to eat for most meals and will use greek dressing and ranch with her salad at lunch - she feels if she cooked more at home, it would be healthier for her.  B: protein shake and/or oatmeal in the monring L: salad with grilled chicken from [facility] D: sometimes just chips or she goes out to eat, she reports cooking at home 1-2x/week where she makes things like pasta, fish, and shrimp. She reports she has cut down on the amount of potatoes she will have, limits red meat, does not add salt after cooking, and uses olive oil for cooking. Discussed heart healthy eating and how to lower LDL cholesterol. Adler reports the major barrier for her to cook at home is time; discussed options to cut down on cooking time such as ingredient prep and utilizing food in different ways during the week i.e. spinach could be used to make a quick salad or as an easy side to a meal. Provided handouts and encouraged her to practice these skills.      Intervention Plan   Intervention Prescribe, educate and counsel regarding individualized specific dietary modifications aiming towards targeted core components such as weight, hypertension, lipid management, diabetes, heart failure and other comorbidities.;Nutrition handout(s) given to patient.    Expected Outcomes Short Term Goal: Understand basic principles of dietary content, such as calories, fat, sodium, cholesterol and nutrients.;Short Term Goal: A plan has been developed with personal nutrition goals set during dietitian appointment.;Long Term Goal: Adherence to  prescribed nutrition plan.             Nutrition Assessments:  MEDIFICTS Score Key: ?70 Need to make dietary changes  40-70 Heart Healthy Diet ? 40 Therapeutic Level Cholesterol Diet  Flowsheet Row Cardiac Rehab from 03/19/2022 in [facility] Cardiac and Pulmonary Rehab  Picture Your Plate Total Score on Admission 64  Picture Your Plate Scores: <62 Unhealthy dietary pattern with much room for improvement. 41-50 Dietary pattern unlikely to meet recommendations for good health and room for improvement. 51-60 More healthful dietary pattern, with some room for improvement.  >60 Healthy dietary pattern, although there may be some specific behaviors that could be improved.    Nutrition Goals Re-Evaluation:   Nutrition Goals Discharge (Final Nutrition Goals Re-Evaluation):   Psychosocial: Target Goals: Acknowledge presence or absence of significant depression and/or stress, maximize coping skills, provide positive support system. Participant is able to verbalize types and ability to use techniques and skills needed for reducing stress and depression.   Education: Stress, Anxiety, and Depression - Group verbal and visual presentation to define topics covered.  Reviews how body is impacted by stress, anxiety, and depression.  Also discusses healthy ways to reduce stress and to treat/manage anxiety and depression.  Written material given at graduation.   Education: Sleep Hygiene -Provides group verbal and written instruction about how sleep can affect your health.  Define sleep hygiene, discuss sleep cycles and impact of sleep habits. Review good sleep hygiene tips.    Initial Review & Psychosocial Screening:  Initial Psych Review & Screening - 03/18/22 0939       Initial Review   Current issues with Current Sleep Concerns      Family Dynamics   Good Support System? Yes    Comments Bobbe has sleep concerns and does not sleep well. She has good family support system and looks  to her mother, sister and daughter.      Barriers   Psychosocial barriers to participate in program There are no identifiable barriers or psychosocial needs.;The patient should benefit from training in stress management and relaxation.      Screening Interventions   Interventions Encouraged to exercise;To provide support and resources with identified psychosocial needs;Provide feedback about the scores to participant    Expected Outcomes Short Term goal: Utilizing psychosocial counselor, staff and physician to assist with identification of specific Stressors or current issues interfering with healing process. Setting desired goal for each stressor or current issue identified.;Long Term Goal: Stressors or current issues are controlled or eliminated.;Short Term goal: Identification and review with participant of any Quality of Life or Depression concerns found by scoring the questionnaire.;Long Term goal: The participant improves quality of Life and PHQ9 Scores as seen by post scores and/or verbalization of changes             Quality of Life Scores:   Quality of Life - 03/19/22 0942       Quality of Life   Select Quality of Life      Quality of Life Scores   Health/Function Pre 21.7 %    Socioeconomic Pre 19.86 %    Psych/Spiritual Pre 22 %    Family Pre 22.8 %    GLOBAL Pre 21.54 %            Scores of 19 and below usually indicate a poorer quality of life in these areas.  A difference of  2-3 points is a clinically meaningful difference.  A difference of 2-3 points in the total score of the Quality of Life Index has been associated with significant improvement in overall quality of life, self-image, physical symptoms, and general health in studies assessing change in quality of life.  PHQ-9: Review Flowsheet       03/19/2022  Depression screen PHQ 2/9  Decreased Interest 1  Down, Depressed, Hopeless 0  PHQ -  2 Score 1  Altered sleeping 3  Tired, decreased energy 1   Change in appetite 0  Feeling bad or failure about yourself  0  Trouble concentrating 1  Moving slowly or fidgety/restless 0  Suicidal thoughts 0  PHQ-9 Score 6  Difficult doing work/chores Not difficult at all   Interpretation of Total Score  Total Score Depression Severity:  1-4 = Minimal depression, 5-9 = Mild depression, 10-14 = Moderate depression, 15-19 = Moderately severe depression, 20-27 = Severe depression   Psychosocial Evaluation and Intervention:  Psychosocial Evaluation - 03/18/22 0940       Psychosocial Evaluation & Interventions   Interventions Encouraged to exercise with the program and follow exercise prescription;Relaxation education;Stress management education    Comments Lyndsie has sleep concerns and does not sleep well. She has good family support system and looks to her mother, sister and daughter.    Expected Outcomes Short: Start HeartTrack to help with mood. Long: Maintain a healthy mental state    Continue Psychosocial Services  Follow up required by staff             Psychosocial Re-Evaluation:   Psychosocial Discharge (Final Psychosocial Re-Evaluation):   Vocational Rehabilitation: Provide vocational rehab assistance to qualifying candidates.   Vocational Rehab Evaluation & Intervention:   Education: Education Goals: Education classes will be provided on a variety of topics geared toward better understanding of heart health and risk factor modification. Participant will state understanding/return demonstration of topics presented as noted by education test scores.  Learning Barriers/Preferences:  Learning Barriers/Preferences - 03/18/22 5852       Learning Barriers/Preferences   Learning Barriers None    Learning Preferences None             General Cardiac Education Topics:  AED/CPR: - Group verbal and written instruction with the use of models to demonstrate the basic use of the AED with the basic ABC's of  resuscitation.   Anatomy and Cardiac Procedures: - Group verbal and visual presentation and models provide information about basic cardiac anatomy and function. Reviews the testing methods done to diagnose heart disease and the outcomes of the test results. Describes the treatment choices: Medical Management, Angioplasty, or Coronary Bypass Surgery for treating various heart conditions including Myocardial Infarction, Angina, Valve Disease, and Cardiac Arrhythmias.  Written material given at graduation.   Medication Safety: - Group verbal and visual instruction to review commonly prescribed medications for heart and lung disease. Reviews the medication, class of the drug, and side effects. Includes the steps to properly store meds and maintain the prescription regimen.  Written material given at graduation.   Intimacy: - Group verbal instruction through game format to discuss how heart and lung disease can affect sexual intimacy. Written material given at graduation..   Know Your Numbers and Heart Failure: - Group verbal and visual instruction to discuss disease risk factors for cardiac and pulmonary disease and treatment options.  Reviews associated critical values for Overweight/Obesity, Hypertension, Cholesterol, and Diabetes.  Discusses basics of heart failure: signs/symptoms and treatments.  Introduces Heart Failure Zone chart for action plan for heart failure.  Written material given at graduation.   Infection Prevention: - Provides verbal and written material to individual with discussion of infection control including proper hand washing and proper equipment cleaning during exercise session. Flowsheet Row Cardiac Rehab from 04/03/2022 in [facility] Cardiac and Pulmonary Rehab  Date 03/18/22  Educator [facility]  Instruction Review Code 1- [employer]  Prevention: - Provides verbal and written material to individual with discussion of falls prevention and  safety. Flowsheet Row Cardiac Rehab from 04/03/2022 in [facility] Cardiac and Pulmonary Rehab  Date 03/18/22  Educator [facility]  Instruction Review Code 1- Verbalizes Understanding       Other: -Provides group and verbal instruction on various topics (see comments)   Knowledge Questionnaire Score:  Knowledge Questionnaire Score - 03/19/22 0942       Knowledge Questionnaire Score   Pre Score 24/26             Core Components/Risk Factors/Patient Goals at Admission:  Personal Goals and Risk Factors at Admission - 03/19/22 0942       Core Components/Risk Factors/Patient Goals on Admission    Weight Management Yes;Weight Maintenance    Intervention Weight Management: Develop a combined nutrition and exercise program designed to reach desired caloric intake, while maintaining appropriate intake of nutrient and fiber, sodium and fats, and appropriate energy expenditure required for the weight goal.;Weight Management: Provide education and appropriate resources to help participant work on and attain dietary goals.    Admit Weight 154 lb 4.8 oz (70 kg)    Goal Weight: Short Term 154 lb 4.8 oz (70 kg)    Goal Weight: Long Term 154 lb 4.8 oz (70 kg)    Expected Outcomes Short Term: Continue to assess and modify interventions until short term weight is achieved;Long Term: Adherence to nutrition and physical activity/exercise program aimed toward attainment of established weight goal;Weight Maintenance: Understanding of the daily nutrition guidelines, which includes 25-35% calories from fat, 7% or less cal from saturated fats, less than '200mg'$  cholesterol, less than 1.5gm of sodium, & 5 or more servings of fruits and vegetables daily;Understanding recommendations for meals to include 15-35% energy as protein, 25-35% energy from fat, 35-60% energy from carbohydrates, less than '200mg'$  of dietary cholesterol, 20-35 gm of total fiber daily;Understanding of distribution of calorie intake throughout the day with  the consumption of 4-5 meals/snacks    Hypertension Yes    Intervention Provide education on lifestyle modifcations including regular physical activity/exercise, weight management, moderate sodium restriction and increased consumption of fresh fruit, vegetables, and low fat dairy, alcohol moderation, and smoking cessation.;Monitor prescription use compliance.    Expected Outcomes Short Term: Continued assessment and intervention until BP is < 140/65m HG in hypertensive participants. < 130/873mHG in hypertensive participants with diabetes, heart failure or chronic kidney disease.;Long Term: Maintenance of blood pressure at goal levels.    Lipids Yes    Intervention Provide education and support for participant on nutrition & aerobic/resistive exercise along with prescribed medications to achieve LDL '70mg'$ , HDL >'40mg'$ .    Expected Outcomes Short Term: Participant states understanding of desired cholesterol values and is compliant with medications prescribed. Participant is following exercise prescription and nutrition guidelines.;Long Term: Cholesterol controlled with medications as prescribed, with individualized exercise RX and with personalized nutrition plan. Value goals: LDL < '70mg'$ , HDL > 40 mg.             Education:Diabetes - Individual verbal and written instruction to review signs/symptoms of diabetes, desired ranges of glucose level fasting, after meals and with exercise. Acknowledge that pre and post exercise glucose checks will be done for 3 sessions at entry of program.   Core Components/Risk Factors/Patient Goals Review:    Core Components/Risk Factors/Patient Goals at Discharge (Final Review):    ITP Comments:  ITP Comments     Row Name 03/18/22 [id number]/23 0933 03/20/22 0725 04/02/22  1660 04/08/22 1542   ITP Comments Virtual Visit completed. Patient informed on EP and RD appointment and 6 Minute walk test. Patient also informed of patient health questionnaires on My  Chart. Patient Verbalizes understanding. Visit diagnosis can be found in [facility] 02/07/2022. Completed 6MWT and gym orientation. Initial ITP created and sent for review to Dr. Emily Filbert, Medical Director. 30 Day review completed. Medical Director ITP review done, changes made as directed, and signed approval by Medical Director.   New to program First full day of exercise!  Patient was oriented to gym and equipment including functions, settings, policies, and procedures.  Patient's individual exercise prescription and treatment plan were reviewed.  All starting workloads were established based on the results of the 6 minute walk test done at initial orientation visit.  The plan for exercise progression was also introduced and progression will be customized based on patient's performance and goals. Pt called to let us know that she would like to discharge from the program.  She has a gym membership and feels that she can keep up with her exercise there.  We discussed monitoring her heart rate for exercise as well.  We will discharge her from the program at this time. She completed 4 sessions.            Comments: Discharge ITP

## 2022-04-29 ENCOUNTER — Encounter: Payer: Self-pay | Admitting: Medical

## 2022-04-29 ENCOUNTER — Other Ambulatory Visit
Admission: RE | Admit: 2022-04-29 | Discharge: 2022-04-29 | Disposition: A | Discharge status: Home / Self Care | Payer: Medicaid Other | Source: Ambulatory Visit | Attending: Medical | Admitting: Medical

## 2022-04-29 ENCOUNTER — Ambulatory Visit: Payer: Medicaid Other | Attending: Medical | Admitting: Medical

## 2022-04-29 ENCOUNTER — Telehealth: Payer: Self-pay

## 2022-04-29 VITALS — BP 120/84 | HR 73 | Ht 64.0 in | Wt 150.4 lb

## 2022-04-29 DIAGNOSIS — I2510 Atherosclerotic heart disease of native coronary artery without angina pectoris: Secondary | ICD-10-CM | POA: Diagnosis not present

## 2022-04-29 DIAGNOSIS — R079 Chest pain, unspecified: Secondary | ICD-10-CM | POA: Insufficient documentation

## 2022-04-29 DIAGNOSIS — I214 Non-ST elevation (NSTEMI) myocardial infarction: Secondary | ICD-10-CM | POA: Insufficient documentation

## 2022-04-29 LAB — HEPATIC FUNCTION PANEL
ALT: 17 U/L (ref 0–44)
AST: 22 U/L (ref 15–41)
Albumin: 4.1 g/dL (ref 3.5–5.0)
Alkaline Phosphatase: 45 U/L (ref 38–126)
Bilirubin, Direct: 0.1 mg/dL (ref 0.0–0.2)
Indirect Bilirubin: 1 mg/dL — ABNORMAL HIGH (ref 0.3–0.9)
Total Bilirubin: 1.1 mg/dL (ref 0.3–1.2)
Total Protein: 7.1 g/dL (ref 6.5–8.1)

## 2022-04-29 LAB — CBC
HCT: 40.9 % (ref 36.0–46.0)
Hemoglobin: 14 g/dL (ref 12.0–15.0)
MCH: 30.2 pg (ref 26.0–34.0)
MCHC: 34.2 g/dL (ref 30.0–36.0)
MCV: 88.1 fL (ref 80.0–100.0)
Platelets: 195 10*3/uL (ref 150–400)
RBC: 4.64 MIL/uL (ref 3.87–5.11)
RDW: 13.2 % (ref 11.5–15.5)
WBC: 4.3 10*3/uL (ref 4.0–10.5)
nRBC: 0 % (ref 0.0–0.2)

## 2022-04-29 LAB — LIPID PANEL
Cholesterol: 157 mg/dL (ref 0–200)
HDL: 76 mg/dL (ref >40)
LDL Cholesterol: 66 mg/dL (ref 0–99)
Total CHOL/HDL Ratio: 2.1 RATIO
Triglycerides: 76 mg/dL (ref <150)
VLDL: 15 mg/dL (ref 0–40)

## 2022-04-29 LAB — LDL CHOLESTEROL, DIRECT: Direct LDL: 56 mg/dL (ref 0–99)

## 2022-04-29 NOTE — Progress Notes (Signed)
Cardiology Office Note:    Date:  04/29/2022   ID:  Kelsey Klein, DOB [birth year], MRN [id number]  PCP:  [facility] Cardiologist:  Nelva Bush, MD  [facility] Electrophysiologist:  None   Referring MD: [facility] [place]   Chief Complaint: 1 month follow-up  History of Present Illness:    Kelsey Klein is a 51 y.o. female with a hx of  breast cancer s/p bilateral mastectomy and CAD s/p PCI m-d LAD who presents for heart cath follow-up.    The patient was admitted to [facility] In June 2023 with chest pressure associated SOB, flushing and sweating. HS troponin peaked 224. CTA chest was negative. Echo showed EF 65-70%, no WMA, mild LVH, normal LV diastolic function parameters, trivial MR. Lexiscan showed no evidence of ischemia with a normal LVEF. No significant calcification was noted. Overall this is a low risk study.    She was readmitted 01/2022 with chest pain with associated dyspnea, nausea/vomiting that lasted for around 30 minutes. HS troponin peaked to 1188. She was given ASA and started on IV heparin. She was taken to th cath lab and found to have 95% mid/distal LAD stenosis treated with DES. She was started on ASA and Plavix x 1 year. Echo showed LVEF 55%, no WMA and mild MR. She was started on statin and BB.    Last seen 02/27/22 and was overall doing well. She was referred to cardiac rehab  Today, the patient reports she is feeling poorly, she reports headache.  No fever, chills, URI symptoms.  She denies chest pain or shortness of breath. She went to cardiac rehab a few times. She is now back at the gym and is having no issues with this.  She feels pretty much back to baseline.  No lower leg edema orthopnea, or pnd.  She denies bleeding issues.  Past Medical History:  Diagnosis Date   Breast cancer, stage 1, estrogen receptor negative, right ([place]) 08/19/2015    18 mm; pT1c  pN0 (0/6nodes positive). ER/PR +; Her 2 neu not  overexpressed   Family history of breast cancer     Past Surgical History:  Procedure Laterality Date   BREAST BIOPSY Left 05/27/2017   U/S core Bx- invasive mammary ER/PR positive Her2 negative   BREAST BIOPSY Left 05/27/2017   Affirm Bx- DCIS   BREAST RECONSTRUCTION WITH PLACEMENT OF TISSUE EXPANDER AND FLEX HD (ACELLULAR HYDRATED DERMIS) Bilateral 08/18/2017   Procedure: BREAST RECONSTRUCTION WITH PLACEMENT OF TISSUE EXPANDER AND FLEX HD (ACELLULAR HYDRATED DERMIS);  Surgeon: Wallace Going, DO;  Location: [facility];  Service: Plastics;  Laterality: Bilateral;   CORONARY STENT INTERVENTION N/A 02/08/2022   Procedure: CORONARY STENT INTERVENTION;  Surgeon: Nelva Bush, MD;  Location: [place] [facility];  Service: Cardiovascular;  Laterality: N/A;   LEFT HEART CATH AND CORONARY ANGIOGRAPHY N/A 02/08/2022   Procedure: LEFT HEART CATH AND CORONARY ANGIOGRAPHY;  Surgeon: Nelva Bush, MD;  Location: [place] [facility];  Service: Cardiovascular;  Laterality: N/A;   LIPOSUCTION WITH LIPOFILLING Bilateral 05/21/2018   Procedure: liposuction with fat grafting and revolve;  Surgeon: Wallace Going, DO;  Location: [place];  Service: Plastics;  Laterality: Bilateral;   LIPOSUCTION WITH LIPOFILLING Bilateral 02/23/2020   Procedure: Fat filling to bilateral breasts for symmetry;  Surgeon: Wallace Going, DO;  Location: [place];  Service: Plastics;  Laterality: Bilateral;   MASTECTOMY W/ SENTINEL NODE BIOPSY Bilateral  08/18/2017   Procedure: MASTECTOMY WITH SENTINEL LYMPH NODE BIOPSY,FROZEN SECTION;  Surgeon: Robert Bellow, MD;  Location: [facility];  Service: General;  Laterality: Bilateral;   REMOVAL OF TISSUE EXPANDER AND PLACEMENT OF IMPLANT Bilateral 12/10/2017   Procedure: REMOVAL OF TISSUE EXPANDERS AND PLACEMENT OF IMPLANTS;  Surgeon: Wallace Going, DO;  Location: [place];  Service: Plastics;  Laterality:  Bilateral;    Current Medications: Current Meds  Medication Sig   Adapalene 0.3 % gel SMARTSIG:sparingly Topical Every Night   anastrozole (ARIMIDEX) 1 MG tablet Take 1 tablet (1 mg total) by mouth daily.   aspirin EC 81 MG tablet Take 1 tablet (81 mg total) by mouth daily. Swallow whole.   metoprolol tartrate (LOPRESSOR) 25 MG tablet Take 0.5 tablets (12.5 mg total) by mouth 2 (two) times daily.   nitroGLYCERIN (NITROSTAT) 0.4 MG SL tablet Place 1 tablet (0.4 mg total) under the tongue every 5 (five) minutes x 3 doses as needed for chest pain.   rosuvastatin (CRESTOR) 20 MG tablet Take 1 tablet (20 mg total) by mouth daily.   ticagrelor (BRILINTA) 90 MG TABS tablet Take 1 tablet (90 mg total) by mouth 2 (two) times daily.     Allergies:   Patient has no known allergies.   Social History   Socioeconomic History   Marital status: Single    Spouse name: Not on file   Number of children: Not on file   Years of education: Not on file   Highest education level: Not on file  Occupational History   Not on file  Tobacco Use   Smoking status: Never   Smokeless tobacco: Never  Vaping Use   Vaping Use: Never used  Substance and Sexual Activity   Alcohol use: Not Currently   Drug use: No   Sexual activity: Yes    Birth control/protection: Condom  Other Topics Concern   Not on file  Social History Narrative   Not on file   Social Determinants of Health   Financial Resource Strain: Not on file  Food Insecurity: Not on file  Transportation Needs: Not on file  Physical Activity: Not on file  Stress: Not on file  Social Connections: Not on file     Family History: The patient's family history includes Alzheimer's disease in her maternal grandmother; Arthritis in her mother; Breast cancer in her paternal aunt and paternal aunt; Cirrhosis in her father; Hypertension in her father and sister.  ROS:   Please see the history of present illness.     All other systems reviewed and  are negative.  EKGs/Labs/Other Studies Reviewed:    The following studies were reviewed today:   LHC 02/08/2022: Conclusions: Two-vessel coronary artery disease with 95% mid/distal LAD stenosis as well as chronic appearing occlusion of very small rPL1 branch supplied by bridging and right-to-right collaterals. Mildly elevated left ventricular filling pressure (LVEDP 20 mmHg). Successful PCI to the mid/distal LAD using Onyx Frontier 2.25 x 18 mm drug-eluting stent with 0% residual stenosis and TIMI-3 flow.   Recommendations: Overnight observation. Dual antiplatelet therapy with aspirin and ticagrelor for at least 12 months. 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 Aggressive secondary prevention of coronary artery disease. __________   Limited echo 02/08/2022: 1. Left ventricular ejection fraction, by estimation, is 55 . The left  ventricle has normal function. The left ventricle has no regional wall  motion abnormalities.   2. Right ventricular systolic function is normal. The right  ventricular  size is normal. Tricuspid regurgitation signal is inadequate for assessing  PA pressure.   3. The mitral valve is normal in structure. Mild mitral valve  regurgitation. No evidence of mitral stenosis.   4. The aortic valve is normal in structure. Aortic valve regurgitation is  not visualized. No aortic stenosis is present.   5. The inferior vena cava is normal in size with greater than 50%  respiratory variability, suggesting right atrial pressure of 3 mmHg.   Lexiscan MPI 10/31/2021:   The study is normal.. The study is low risk.   No ST deviation was noted.   LV perfusion is normal.   Left ventricular function is normal. calculated LVEF is 73%.   No significant coronary artery calcifications noted. __________   2D echo 10/30/2021: 1. Left ventricular ejection fraction, by estimation, is 65 to 70%. The  left ventricle has normal function. The left  ventricle has no regional  wall motion abnormalities. There is mild left ventricular hypertrophy.  Left ventricular diastolic parameters  were normal.   2. Right ventricular systolic function is normal. The right ventricular  size is normal. There is normal pulmonary artery systolic pressure.   3. The mitral valve is normal in structure. Trivial mitral valve  regurgitation. No evidence of mitral stenosis.   4. The aortic valve is tricuspid. Aortic valve regurgitation is not  visualized. No aortic stenosis is present.   5. The inferior vena cava is normal in size with <50% respiratory  variability, suggesting right atrial pressure of 8 mmHg.  EKG:  EKG is not ordered today.   Recent Labs: 02/07/2022: ALT 17 02/08/2022: Hemoglobin 12.7; Platelets 176 02/09/2022: BUN 12; Creatinine, Ser 0.87; Potassium 4.0; Sodium 138  Recent Lipid Panel    Component Value Date/Time   CHOL 158 10/31/2021 0313   TRIG 38 10/31/2021 0313   HDL 82 10/31/2021 0313   CHOLHDL 1.9 10/31/2021 0313   VLDL 8 10/31/2021 0313   LDLCALC 68 10/31/2021 0313      Physical Exam:    VS:  BP 120/84 (BP Location: Right Arm, Patient Position: Sitting, Cuff Size: Normal)   Pulse 73   Ht _0  (1.626 m)   Wt 150 lb 6.4 oz (68.2 kg)   SpO2 98%   BMI 25.82 kg/m     Wt Readings from Last 3 Encounters:  04/29/22 150 lb 6.4 oz (68.2 kg)  03/19/22 154 lb 4.8 oz (70 kg)  02/27/22 154 lb 2 oz (69.9 kg)     GEN:  Well nourished, well developed in no acute distress HEENT: Normal NECK: No JVD; No carotid bruits LYMPHATICS: No lymphadenopathy CARDIAC: RRR, no murmurs, rubs, gallops RESPIRATORY:  Clear to auscultation without rales, wheezing or rhonchi  ABDOMEN: Soft, non-tender, non-distended MUSCULOSKELETAL:  No edema; No deformity  SKIN: Warm and dry NEUROLOGIC:  Alert and oriented x 3 PSYCHIATRIC:  Normal affect   ASSESSMENT:    1. NSTEMI (non-ST elevated myocardial infarction) (HCC)   2. Chest pain with  moderate risk for cardiac etiology   3. Coronary artery disease involving native coronary artery of native heart without angina pectoris    PLAN:    In order of problems listed above:  NSTEMI CAD s/p PCI mLAD Patient is overall doing well from a cardiac perspective.  She went to cardiac rehab a couple times, however she is now back at the gym and feels back to her baseline. She denies chest pain or shortness of breath. She denies bleeding issues  with DAPT.  I will check a CBC today.  Patient is on Crestor 20 mg I will recheck LFTs, lipid panel.  Continue aspirin, Brilinta, Lopressor, sublingual nitro.   Disposition: Follow up in 3 months with MD/APP    Signed, Rhia Blatchford Ninfa Meeker, PA-C  04/29/2022 8:56 AM    [facility]

## 2022-04-29 NOTE — Patient Instructions (Signed)
Medication Instructions:  Your physician recommends that you continue on your current medications as directed. Please refer to the Current Medication list given to you today.  *If you need a refill on your cardiac medications before your next appointment, please call your pharmacy*   Lab Work: Liver function testing, lipid panel, direct LDL, and CBC - Please go to the [facility]. You will check in at the front desk to the right as you walk into the atrium. Valet Parking is offered if needed. - No appointment needed. You may go any day between 7 am and 6 pm.    If you have labs (blood work) drawn today and your tests are completely normal, you will receive your results only by: [place] (if you have MyChart) OR A paper copy in the mail If you have any lab test that is abnormal or we need to change your treatment, we will call you to review the results.   Testing/Procedures: None ordered  Follow-Up: At [facility], you and your health needs are our priority.  As part of our continuing mission to provide you with exceptional heart care, we have created designated Provider Care Teams.  These Care Teams include your primary Cardiologist (physician) and Advanced Practice Providers (APPs -  Physician Assistants and Nurse Practitioners) who all work together to provide you with the care you need, when you need it.  We recommend signing up for the patient portal called "MyChart".  Sign up information is provided on this After Visit Summary.  MyChart is used to connect with patients for Virtual Visits (Telemedicine).  Patients are able to view lab/test results, encounter notes, upcoming appointments, etc.  Non-urgent messages can be sent to your provider as well.   To learn more about what you can do with MyChart, go to [email or web address].    Your next appointment:   3 month(s)  The format for your next appointment:   In Person  Provider:   You may see Nelva Bush, MD or one of the following Advanced Practice Providers on your designated Care Team:   Murray Hodgkins, NP Christell Faith, PA-C Cadence Kathlen Mody, PA-C Gerrie Nordmann, NP   Important Information About Sugar

## 2022-04-29 NOTE — Telephone Encounter (Signed)
Attempted to contact pt. No answer and vm box is full.  Unable to leave message. Results released to [place].

## 2022-04-29 NOTE — Telephone Encounter (Signed)
Furth, Cadence H, PA-C  P Cv Div [place] Triage LDL 56. LFTs normal

## 2022-07-29 NOTE — Progress Notes (Unsigned)
Cardiology Office Note:    Date:  07/30/2022   ID:  Kelsey Klein, DOB Jan 20, 1971, MRN [id number]  PCP:  [facility] Cardiologist:  Nelva Bush, MD  [facility] Electrophysiologist:  None   Referring MD: [facility] [place]   Chief Complaint: 3 month follow-up  History of Present Illness:    Kelsey Klein is a 52 y.o. female with a hx of  breast cancer s/p bilateral mastectomy and CAD s/p PCI m-d LAD who presents for 3 month follow-up.    The patient was admitted to [facility] In June 2023 with chest pressure associated SOB, flushing and sweating. HS troponin peaked 224. CTA chest was negative. Echo showed EF 65-70%, no WMA, mild LVH, normal LV diastolic function parameters, trivial MR. Lexiscan showed no evidence of ischemia with a normal LVEF. No significant calcification was noted. Overall this is a low risk study.    She was readmitted 01/2022 with chest pain with associated dyspnea, nausea/vomiting that lasted for around 30 minutes. HS troponin peaked to 1188. She was given ASA and started on IV heparin. She was taken to th cath lab and found to have 95% mid/distal LAD stenosis treated with DES. She was started on ASA and Plavix x 1 year. Echo showed LVEF 55%, no WMA and mild MR. She was started on statin and BB. Patient was referred to cardiac rehab.   Last seen 04/2022 and was feeling poorly. She felt she had a migraine headache.Labs were checked.   Today, the patient reports she has been feeling better. She denies chest pain or shortness of breath. No lower leg edema, orthopnea or pnd. No lightheadedness or dizziness. Activity level is good, she does exercise twice weekly.   Past Medical History:  Diagnosis Date   Breast cancer, stage 1, estrogen receptor negative, right ([place]) 08/19/2015    18 mm; pT1c  pN0 (0/6nodes positive). ER/PR +; Her 2 neu not overexpressed   Family history of breast cancer     Past Surgical History:   Procedure Laterality Date   BREAST BIOPSY Left 05/27/2017   U/S core Bx- invasive mammary ER/PR positive Her2 negative   BREAST BIOPSY Left 05/27/2017   Affirm Bx- DCIS   BREAST RECONSTRUCTION WITH PLACEMENT OF TISSUE EXPANDER AND FLEX HD (ACELLULAR HYDRATED DERMIS) Bilateral 08/18/2017   Procedure: BREAST RECONSTRUCTION WITH PLACEMENT OF TISSUE EXPANDER AND FLEX HD (ACELLULAR HYDRATED DERMIS);  Surgeon: Wallace Going, DO;  Location: [facility];  Service: Plastics;  Laterality: Bilateral;   CORONARY STENT INTERVENTION N/A 02/08/2022   Procedure: CORONARY STENT INTERVENTION;  Surgeon: Nelva Bush, MD;  Location: [place] [facility];  Service: Cardiovascular;  Laterality: N/A;   LEFT HEART CATH AND CORONARY ANGIOGRAPHY N/A 02/08/2022   Procedure: LEFT HEART CATH AND CORONARY ANGIOGRAPHY;  Surgeon: Nelva Bush, MD;  Location: [place] [facility];  Service: Cardiovascular;  Laterality: N/A;   LIPOSUCTION WITH LIPOFILLING Bilateral 05/21/2018   Procedure: liposuction with fat grafting and revolve;  Surgeon: Wallace Going, DO;  Location: [place];  Service: Plastics;  Laterality: Bilateral;   LIPOSUCTION WITH LIPOFILLING Bilateral 02/23/2020   Procedure: Fat filling to bilateral breasts for symmetry;  Surgeon: Wallace Going, DO;  Location: [place];  Service: Plastics;  Laterality: Bilateral;   MASTECTOMY W/ SENTINEL NODE BIOPSY Bilateral 08/18/2017   Procedure: MASTECTOMY WITH SENTINEL LYMPH NODE BIOPSY,FROZEN SECTION;  Surgeon: Robert Bellow, MD;  Location: [facility];  Service:  General;  Laterality: Bilateral;   REMOVAL OF TISSUE EXPANDER AND PLACEMENT OF IMPLANT Bilateral 12/10/2017   Procedure: REMOVAL OF TISSUE EXPANDERS AND PLACEMENT OF IMPLANTS;  Surgeon: Wallace Going, DO;  Location: [place];  Service: Plastics;  Laterality: Bilateral;    Current Medications: Current Meds  Medication Sig   Adapalene 0.3 %  gel SMARTSIG:sparingly Topical Every Night   anastrozole (ARIMIDEX) 1 MG tablet Take 1 tablet (1 mg total) by mouth daily.   aspirin EC 81 MG tablet Take 1 tablet (81 mg total) by mouth daily. Swallow whole.   metoprolol tartrate (LOPRESSOR) 25 MG tablet Take 0.5 tablets (12.5 mg total) by mouth 2 (two) times daily.   nitroGLYCERIN (NITROSTAT) 0.4 MG SL tablet Place 1 tablet (0.4 mg total) under the tongue every 5 (five) minutes x 3 doses as needed for chest pain.   rosuvastatin (CRESTOR) 20 MG tablet Take 1 tablet (20 mg total) by mouth daily.   ticagrelor (BRILINTA) 90 MG TABS tablet Take 1 tablet (90 mg total) by mouth 2 (two) times daily.     Allergies:   Patient has no known allergies.   Social History   Socioeconomic History   Marital status: Single    Spouse name: Not on file   Number of children: Not on file   Years of education: Not on file   Highest education level: Not on file  Occupational History   Not on file  Tobacco Use   Smoking status: Never   Smokeless tobacco: Never  Vaping Use   Vaping Use: Never used  Substance and Sexual Activity   Alcohol use: Not Currently   Drug use: No   Sexual activity: Yes    Birth control/protection: Condom  Other Topics Concern   Not on file  Social History Narrative   Not on file   Social Determinants of Health   Financial Resource Strain: Not on file  Food Insecurity: Not on file  Transportation Needs: Not on file  Physical Activity: Not on file  Stress: Not on file  Social Connections: Not on file     Family History: The patient's family history includes Alzheimer's disease in her maternal grandmother; Arthritis in her mother; Breast cancer in her paternal aunt and paternal aunt; Cirrhosis in her father; Hypertension in her father and sister.  ROS:   Please see the history of present illness.     All other systems reviewed and are negative.  EKGs/Labs/Other Studies Reviewed:    The following studies were  reviewed today:   LHC 02/08/2022: Conclusions: Two-vessel coronary artery disease with 95% mid/distal LAD stenosis as well as chronic appearing occlusion of very small rPL1 branch supplied by bridging and right-to-right collaterals. Mildly elevated left ventricular filling pressure (LVEDP 20 mmHg). Successful PCI to the mid/distal LAD using Onyx Frontier 2.25 x 18 mm drug-eluting stent with 0% residual stenosis and TIMI-3 flow.   Recommendations: Overnight observation. Dual antiplatelet therapy with aspirin and ticagrelor for at least 12 months. 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 Aggressive secondary prevention of coronary artery disease. __________   Limited echo 02/08/2022: 1. Left ventricular ejection fraction, by estimation, is 55 . The left  ventricle has normal function. The left ventricle has no regional wall  motion abnormalities.   2. Right ventricular systolic function is normal. The right ventricular  size is normal. Tricuspid regurgitation signal is inadequate for assessing  PA pressure.   3. The mitral valve is normal  in structure. Mild mitral valve  regurgitation. No evidence of mitral stenosis.   4. The aortic valve is normal in structure. Aortic valve regurgitation is  not visualized. No aortic stenosis is present.   5. The inferior vena cava is normal in size with greater than 50%  respiratory variability, suggesting right atrial pressure of 3 mmHg.   Lexiscan MPI 10/31/2021:   The study is normal.. The study is low risk.   No ST deviation was noted.   LV perfusion is normal.   Left ventricular function is normal. calculated LVEF is 73%.   No significant coronary artery calcifications noted. __________   2D echo 10/30/2021: 1. Left ventricular ejection fraction, by estimation, is 65 to 70%. The  left ventricle has normal function. The left ventricle has no regional  wall motion abnormalities. There is mild left ventricular  hypertrophy.  Left ventricular diastolic parameters  were normal.   2. Right ventricular systolic function is normal. The right ventricular  size is normal. There is normal pulmonary artery systolic pressure.   3. The mitral valve is normal in structure. Trivial mitral valve  regurgitation. No evidence of mitral stenosis.   4. The aortic valve is tricuspid. Aortic valve regurgitation is not  visualized. No aortic stenosis is present.   5. The inferior vena cava is normal in size with <50% respiratory  variability, suggesting right atrial pressure of 8 mmHg.    EKG:  EKG is not  ordered today.    Recent Labs: 02/09/2022: BUN 12; Creatinine, Ser 0.87; Potassium 4.0; Sodium 138 04/29/2022: ALT 17; Hemoglobin 14.0; Platelets 195  Recent Lipid Panel    Component Value Date/Time   CHOL 157 04/29/2022 0903   TRIG 76 04/29/2022 0903   HDL 76 04/29/2022 0903   CHOLHDL 2.1 04/29/2022 0903   VLDL 15 04/29/2022 0903   LDLCALC 66 04/29/2022 0903   LDLDIRECT 56 04/29/2022 0903    Physical Exam:    VS:  BP 121/81   Pulse 75   Ht 5' 3"$  (1.6 m)   Wt 153 lb (69.4 kg)   SpO2 98%   BMI 27.10 kg/m     Wt Readings from Last 3 Encounters:  07/30/22 153 lb (69.4 kg)  04/29/22 150 lb 6.4 oz (68.2 kg)  03/19/22 154 lb 4.8 oz (70 kg)     GEN:  Well nourished, well developed in no acute distress HEENT: Normal NECK: No JVD; No carotid bruits LYMPHATICS: No lymphadenopathy CARDIAC: RRR, no murmurs, rubs, gallops RESPIRATORY:  Clear to auscultation without rales, wheezing or rhonchi  ABDOMEN: Soft, non-tender, non-distended MUSCULOSKELETAL:  No edema; No deformity  SKIN: Warm and dry NEUROLOGIC:  Alert and oriented x 3 PSYCHIATRIC:  Normal affect   ASSESSMENT:    1. Coronary artery disease involving native coronary artery of native heart without angina pectoris    PLAN:    In order of problems listed above:  CAD s/p PCI mLAD 01/2022 Patient denies chest pain or shortness of breath.  She is working out 1-2 times a week. Continue DAPT with Aspirin and Brilinta. LDL came back at 56. Continue Aspirin, Brilinta Lopressor 12.53m bID, SL NTG and Crestor 261mdaily. No further ischemic work-up at this time. At follow-up, can stop Brilinta.   Disposition: Follow up in 6 month(s) with MD/APP    Signed, Lachandra Dettmann H Ninfa MeekerPA-C  07/30/2022 10:56 AM    [facility]

## 2022-07-30 ENCOUNTER — Ambulatory Visit: Payer: Medicaid Other | Attending: Medical | Admitting: Medical

## 2022-07-30 ENCOUNTER — Encounter: Payer: Self-pay | Admitting: Medical

## 2022-07-30 VITALS — BP 121/81 | HR 75 | Ht 63.0 in | Wt 153.0 lb

## 2022-07-30 DIAGNOSIS — I2510 Atherosclerotic heart disease of native coronary artery without angina pectoris: Secondary | ICD-10-CM | POA: Diagnosis not present

## 2022-07-30 NOTE — Patient Instructions (Signed)
Medication Instructions:   Your physician recommends that you continue on your current medications as directed. Please refer to the Current Medication list given to you today.  *If you need a refill on your cardiac medications before your next appointment, please call your pharmacy*   Lab Work:  NONE  If you have labs (blood work) drawn today and your tests are completely normal, you will receive your results only by: Redbird Smith (if you have MyChart) OR A paper copy in the mail If you have any lab test that is abnormal or we need to change your treatment, we will call you to review the results.   Testing/Procedures:  NONE   Follow-Up: At [facility], you and your health needs are our priority.  As part of our continuing mission to provide you with exceptional heart care, we have created designated Provider Care Teams.  These Care Teams include your primary Cardiologist (physician) and Advanced Practice Providers (APPs -  Physician Assistants and Nurse Practitioners) who all work together to provide you with the care you need, when you need it.  We recommend signing up for the patient portal called "MyChart".  Sign up information is provided on this After Visit Summary.  MyChart is used to connect with patients for Virtual Visits (Telemedicine).  Patients are able to view lab/test results, encounter notes, upcoming appointments, etc.  Non-urgent messages can be sent to your provider as well.   To learn more about what you can do with MyChart, go to [email or web address].    Your next appointment:    6 month(s) AFTER THE 25TH  Provider:   You may see Nelva Bush, MD or one of the following Advanced Practice Providers on your designated Care Team:   Murray Hodgkins, NP Christell Faith, PA-C Cadence Kathlen Mody, PA-C Gerrie Nordmann, NP

## 2022-08-13 ENCOUNTER — Other Ambulatory Visit: Payer: Self-pay | Admitting: *Deleted

## 2022-08-13 ENCOUNTER — Inpatient Hospital Stay: Payer: Medicaid Other | Attending: Oncology

## 2022-08-13 ENCOUNTER — Other Ambulatory Visit: Payer: Self-pay

## 2022-08-13 ENCOUNTER — Inpatient Hospital Stay: Payer: Medicaid Other | Admitting: Oncology

## 2022-08-13 DIAGNOSIS — C50512 Malignant neoplasm of lower-outer quadrant of left female breast: Secondary | ICD-10-CM

## 2022-08-19 ENCOUNTER — Inpatient Hospital Stay: Payer: Medicaid Other

## 2022-08-19 ENCOUNTER — Encounter: Payer: Self-pay | Admitting: Oncology

## 2022-08-19 ENCOUNTER — Inpatient Hospital Stay: Payer: Medicaid Other | Attending: Oncology | Admitting: Oncology

## 2022-08-19 VITALS — BP 137/87 | HR 62 | Temp 96.8°F | Resp 16 | Wt 151.4 lb

## 2022-08-19 DIAGNOSIS — C50512 Malignant neoplasm of lower-outer quadrant of left female breast: Secondary | ICD-10-CM

## 2022-08-19 DIAGNOSIS — Z170 Estrogen receptor positive status [ER+]: Secondary | ICD-10-CM | POA: Insufficient documentation

## 2022-08-19 DIAGNOSIS — Z79811 Long term (current) use of aromatase inhibitors: Secondary | ICD-10-CM | POA: Insufficient documentation

## 2022-08-19 LAB — CMP (CANCER CENTER ONLY)
ALT: 15 U/L (ref 0–44)
AST: 19 U/L (ref 15–41)
Albumin: 4.1 g/dL (ref 3.5–5.0)
Alkaline Phosphatase: 47 U/L (ref 38–126)
Anion gap: 7 (ref 5–15)
BUN: 13 mg/dL (ref 6–20)
CO2: 28 mmol/L (ref 22–32)
Calcium: 9.2 mg/dL (ref 8.9–10.3)
Chloride: 103 mmol/L (ref 98–111)
Creatinine: 1 mg/dL (ref 0.44–1.00)
GFR, Estimated: >60 mL/min (ref >60)
Glucose, Bld: 114 mg/dL — ABNORMAL HIGH (ref 70–99)
Potassium: 3.8 mmol/L (ref 3.5–5.1)
Sodium: 138 mmol/L (ref 135–145)
Total Bilirubin: 0.7 mg/dL (ref 0.3–1.2)
Total Protein: 7.3 g/dL (ref 6.5–8.1)

## 2022-08-19 LAB — CBC WITH DIFFERENTIAL/PLATELET
Abs Immature Granulocytes: 0 10*3/uL (ref 0.00–0.07)
Basophils Absolute: 0 10*3/uL (ref 0.0–0.1)
Basophils Relative: 0 %
Eosinophils Absolute: 0.3 10*3/uL (ref 0.0–0.5)
Eosinophils Relative: 8 %
HCT: 43.4 % (ref 36.0–46.0)
Hemoglobin: 14.4 g/dL (ref 12.0–15.0)
Immature Granulocytes: 0 %
Lymphocytes Relative: 52 %
Lymphs Abs: 1.6 10*3/uL (ref 0.7–4.0)
MCH: 29.4 pg (ref 26.0–34.0)
MCHC: 33.2 g/dL (ref 30.0–36.0)
MCV: 88.8 fL (ref 80.0–100.0)
Monocytes Absolute: 0.2 10*3/uL (ref 0.1–1.0)
Monocytes Relative: 6 %
Neutro Abs: 1 10*3/uL — ABNORMAL LOW (ref 1.7–7.7)
Neutrophils Relative %: 34 %
Platelets: 198 10*3/uL (ref 150–400)
RBC: 4.89 MIL/uL (ref 3.87–5.11)
RDW: 12.7 % (ref 11.5–15.5)
WBC: 3.1 10*3/uL — ABNORMAL LOW (ref 4.0–10.5)
nRBC: 0 % (ref 0.0–0.2)

## 2022-08-19 NOTE — Progress Notes (Signed)
Pt stated she had dizzy episodes 2 weeks ago that she is concerned about.

## 2022-08-19 NOTE — Progress Notes (Signed)
[name]  [phone] [phone]  ID: Kelsey Klein OB: 1971-02-01  MR#: [id number]  [id number]  Patient Care Team: [facility], [place] as PCP - General ([place]) End, Harrell Gave, MD as PCP - Cardiology (Cardiology) Rico Junker, RN as Registered Nurse Grayland Ormond Kathlene November, MD as Consulting Physician (Oncology)  CHIEF COMPLAINT: Pathologic stage Ia ER-PR positive, HER-2 negative invasive lower outer quadrant the left breast.  INTERVAL HISTORY: Patient returns to clinic today for routine yearly evaluation.  She had an MRI in August 2023 requiring 1 stent placement and is now being medically managed by cardiology.  She is fully recovered and back to her baseline.  She continues to tolerate anastrozole well without significant side effects.  She has no neurologic complaints.  She denies any recent fevers or illnesses.  She has a good appetite and denies weight loss.  She denies any chest pain, shortness of breath, cough, or hemoptysis.  She denies any nausea, vomiting, constipation, or diarrhea.  She has no urinary complaints.  Patient offers no further specific complaints today.  REVIEW OF SYSTEMS:   Review of Systems  Constitutional: Negative.  Negative for fever, malaise/fatigue and weight loss.  Respiratory: Negative.  Negative for cough and shortness of breath.   Cardiovascular: Negative.  Negative for chest pain and leg swelling.  Gastrointestinal: Negative.  Negative for abdominal pain and constipation.  Genitourinary: Negative.  Negative for dysuria.  Musculoskeletal: Negative.  Negative for back pain.  Skin: Negative.  Negative for rash.  Neurological: Negative.  Negative for focal weakness, weakness and headaches.  Psychiatric/Behavioral: Negative.  The patient is not nervous/anxious.     As per HPI. Otherwise, a complete review of systems is negative.  PAST MEDICAL HISTORY: Past Medical History:   Diagnosis Date   Breast cancer, stage 1, estrogen receptor negative, right ([place]) 08/19/2015    18 mm; pT1c  pN0 (0/6nodes positive). ER/PR +; Her 2 neu not overexpressed   Family history of breast cancer     PAST SURGICAL HISTORY: Past Surgical History:  Procedure Laterality Date   BREAST BIOPSY Left 05/27/2017   U/S core Bx- invasive mammary ER/PR positive Her2 negative   BREAST BIOPSY Left 05/27/2017   Affirm Bx- DCIS   BREAST RECONSTRUCTION WITH PLACEMENT OF TISSUE EXPANDER AND FLEX HD (ACELLULAR HYDRATED DERMIS) Bilateral 08/18/2017   Procedure: BREAST RECONSTRUCTION WITH PLACEMENT OF TISSUE EXPANDER AND FLEX HD (ACELLULAR HYDRATED DERMIS);  Surgeon: Wallace Going, DO;  Location: [facility];  Service: Plastics;  Laterality: Bilateral;   CORONARY STENT INTERVENTION N/A 02/08/2022   Procedure: CORONARY STENT INTERVENTION;  Surgeon: Nelva Bush, MD;  Location: [place] [facility];  Service: Cardiovascular;  Laterality: N/A;   LEFT HEART CATH AND CORONARY ANGIOGRAPHY N/A 02/08/2022   Procedure: LEFT HEART CATH AND CORONARY ANGIOGRAPHY;  Surgeon: Nelva Bush, MD;  Location: [place] [facility];  Service: Cardiovascular;  Laterality: N/A;   LIPOSUCTION WITH LIPOFILLING Bilateral 05/21/2018   Procedure: liposuction with fat grafting and revolve;  Surgeon: Wallace Going, DO;  Location: [place];  Service: Plastics;  Laterality: Bilateral;   LIPOSUCTION WITH LIPOFILLING Bilateral 02/23/2020   Procedure: Fat filling to bilateral breasts for symmetry;  Surgeon: Wallace Going, DO;  Location: [place];  Service: Plastics;  Laterality: Bilateral;   MASTECTOMY W/ SENTINEL NODE BIOPSY Bilateral 08/18/2017   Procedure: MASTECTOMY WITH SENTINEL LYMPH NODE BIOPSY,FROZEN SECTION;  Surgeon: Robert Bellow, MD;  Location: [facility];  Service: General;  Laterality: Bilateral;   REMOVAL OF TISSUE EXPANDER AND PLACEMENT OF IMPLANT Bilateral 12/10/2017    Procedure: REMOVAL OF TISSUE EXPANDERS AND PLACEMENT OF IMPLANTS;  Surgeon: Wallace Going, DO;  Location: [place];  Service: Plastics;  Laterality: Bilateral;    FAMILY HISTORY: Family History  Problem Relation Age of Onset   Arthritis Mother    Cirrhosis Father        deceased 46; adopted   Hypertension Father    Hypertension Sister    Alzheimer's disease Maternal Grandmother    Breast cancer Paternal Aunt        age at dx unknown   Breast cancer Paternal Aunt        age at dx unknown    ADVANCED DIRECTIVES (Y/N):  N  HEALTH MAINTENANCE: Social History   Tobacco Use   Smoking status: Never   Smokeless tobacco: Never  Vaping Use   Vaping Use: Never used  Substance Use Topics   Alcohol use: Not Currently   Drug use: No     Colonoscopy:  PAP:  Bone density:  Lipid panel:  No Known Allergies  Current Outpatient Medications  Medication Sig Dispense Refill   anastrozole (ARIMIDEX) 1 MG tablet Take 1 tablet (1 mg total) by mouth daily. 90 tablet 3   aspirin EC 81 MG tablet Take 1 tablet (81 mg total) by mouth daily. Swallow whole. 30 tablet 12   metoprolol tartrate (LOPRESSOR) 25 MG tablet Take 0.5 tablets (12.5 mg total) by mouth 2 (two) times daily. 60 tablet 2   nitroGLYCERIN (NITROSTAT) 0.4 MG SL tablet Place 1 tablet (0.4 mg total) under the tongue every 5 (five) minutes x 3 doses as needed for chest pain. 30 tablet 0   rosuvastatin (CRESTOR) 20 MG tablet Take 1 tablet (20 mg total) by mouth daily. 30 tablet 6   ticagrelor (BRILINTA) 90 MG TABS tablet Take 1 tablet (90 mg total) by mouth 2 (two) times daily. 60 tablet 2   Adapalene 0.3 % gel SMARTSIG:sparingly Topical Every Night (Patient not taking: Reported on 08/19/2022)     No current facility-administered medications for this visit.    OBJECTIVE: Vitals:   08/19/22 0906  BP: 137/87  Pulse: 62  Resp: 16  Temp: (!) 96.8 F (36 C)  SpO2: 100%     Body mass index is 26.82 kg/m.     ECOG FS:0 - Asymptomatic  General: Well-developed, well-nourished, no acute distress. Eyes: Pink conjunctiva, anicteric sclera. HEENT: Normocephalic, moist mucous membranes. Breast: Bilateral mastectomies with reconstruction. Lungs: No audible wheezing or coughing. Heart: Regular rate and rhythm. Abdomen: Soft, nontender, no obvious distention. Musculoskeletal: No edema, cyanosis, or clubbing. Neuro: Alert, answering all questions appropriately. Cranial nerves grossly intact. Skin: No rashes or petechiae noted. Psych: Normal affect.   LAB RESULTS:  Lab Results  Component Value Date   NA 138 08/19/2022   K 3.8 08/19/2022   CL 103 08/19/2022   CO2 28 08/19/2022   GLUCOSE 114 (H) 08/19/2022   BUN 13 08/19/2022   CREATININE 1.00 08/19/2022   CALCIUM 9.2 08/19/2022   PROT 7.3 08/19/2022   ALBUMIN 4.1 08/19/2022   AST 19 08/19/2022   ALT 15 08/19/2022   ALKPHOS 47 08/19/2022   BILITOT 0.7 08/19/2022   GFRNONAA >60 08/19/2022    Lab Results  Component Value Date   WBC 3.1 (L) 08/19/2022   NEUTROABS 1.0 (L) 08/19/2022   HGB 14.4 08/19/2022   HCT 43.4 08/19/2022  MCV 88.8 08/19/2022   PLT 198 08/19/2022     STUDIES: No results found.  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  Oncotype DX score 13, low risk of recurrence.  PLAN:    Pathologic stage Ia ER/PR positive, HER-2 negative invasive lower outer quadrant the left breast: Patient is now status post bilateral mastectomy with reconstruction.  Patient underwent bilateral mastectomy on August 18, 2017.  Because she had bilateral mastectomy, she did not require adjuvant XRT.  She had a low risk Oncotype score therefore chemotherapy was unnecessary.  Patient was initially on tamoxifen, but this was discontinued secondary to increasing depression and mood changes.  She was transitioned to anastrozole and is tolerating treatment well.  Because she has had  bilateral mastectomies, no further mammograms are necessary.  Patient has now completed 5 years of treatment and has been instructed to discontinue anastrozole at the conclusion of this prescription.  After discussion with the patient, it was agreed upon that no further follow-up is necessary.  Please refer patient back if there are any questions or concerns.  Bone health: Unclear when patient's last bone mineral density was.  These can now be completed by primary care. Cardiac disease: Continue follow-up with cardiology as scheduled.    Patient expressed understanding and was in agreement with this plan. She also understands that She can call clinic at any time with any questions, concerns, or complaints.    Cancer Staging  Primary cancer of lower-outer quadrant of left breast [facility]) Staging form: Breast, AJCC 8th Edition - Clinical stage from 05/31/2017: Stage IB (cT2, cN0, cM0, G1, ER+, PR+, HER2-) - Signed by Lloyd Huger, MD on 05/31/2017 Histologic grading system: 3 grade system Laterality: Left - Pathologic stage from 09/26/2017: Stage IA (pT1c, pN0, cM0, G2, ER+, PR+, HER2-, Oncotype DX score: 13) - Signed by Lloyd Huger, MD on 09/26/2017 Neoadjuvant therapy: No Multigene prognostic tests performed: Oncotype DX Recurrence score range: Greater than or equal to 11 Histologic grading system: 3 grade system Laterality: Left   Lloyd Huger, MD   08/19/2022 9:37 AM

## 2023-04-09 ENCOUNTER — Ambulatory Visit: Payer: Medicaid Other | Attending: Student | Admitting: Student

## 2023-04-09 ENCOUNTER — Encounter: Payer: Self-pay | Admitting: Student

## 2023-04-09 ENCOUNTER — Ambulatory Visit: Payer: Medicaid Other | Admitting: Student

## 2023-04-09 VITALS — BP 118/80 | HR 63 | Ht 62.0 in | Wt 149.0 lb

## 2023-04-09 DIAGNOSIS — E785 Hyperlipidemia, unspecified: Secondary | ICD-10-CM | POA: Diagnosis not present

## 2023-04-09 DIAGNOSIS — R0602 Shortness of breath: Secondary | ICD-10-CM | POA: Diagnosis not present

## 2023-04-09 DIAGNOSIS — I2510 Atherosclerotic heart disease of native coronary artery without angina pectoris: Secondary | ICD-10-CM | POA: Diagnosis not present

## 2023-04-09 MED ORDER — METOPROLOL TARTRATE 25 MG PO TABS: 12.5000 mg | ORAL_TABLET | Freq: Two times a day (BID) | ORAL | 3 refills | Status: AC

## 2023-04-09 MED ORDER — ROSUVASTATIN CALCIUM 20 MG PO TABS: 20.0000 mg | ORAL_TABLET | Freq: Every day | ORAL | 3 refills | Status: AC

## 2023-04-09 NOTE — Patient Instructions (Signed)
Medication Instructions:  No changes *If you need a refill on your cardiac medications before your next appointment, please call your pharmacy*   Lab Work: Your provider would like for you to return in the next few weeks to have the following labs drawn: fasting Lipid and CMET.   Please go to [facility] [address] ([facility] [address], [place] [address] You do not need an appointment.  They are open from 7:30 am-4 pm.  Lunch from 1:00 pm- 2:00 pm You will need to be fasting.   If you have labs (blood work) drawn today and your tests are completely normal, you will receive your results only by: MyChart Message (if you have MyChart) OR A paper copy in the mail If you have any lab test that is abnormal or we need to change your treatment, we will call you to review the results.   Testing/Procedures: None ordered   Follow-Up: At [facility], you and your health needs are our priority.  As part of our continuing mission to provide you with exceptional heart care, we have created designated Provider Care Teams.  These Care Teams include your primary Cardiologist (physician) and Advanced Practice Providers (APPs -  Physician Assistants and Nurse Practitioners) who all work together to provide you with the care you need, when you need it.  We recommend signing up for the patient portal called "MyChart".  Sign up information is provided on this After Visit Summary.  MyChart is used to connect with patients for Virtual Visits (Telemedicine).  Patients are able to view lab/test results, encounter notes, upcoming appointments, etc.  Non-urgent messages can be sent to your provider as well.   To learn more about what you can do with MyChart, go to [email or web address].    Your next appointment:   6 month(s)  Provider:   You may see Yvonne Kendall, MD or one of the following Advanced Practice Providers on your designated Care Team:   Nicolasa Ducking,  NP Eula Listen, PA-C Cadence Fransico Michael, PA-C Charlsie Quest, NP

## 2023-04-09 NOTE — Progress Notes (Signed)
Cardiology Clinic Note   Date: 04/09/2023 ID: KEIR KUMPF, DOB March 03, 1971, MRN [id number]  Primary Cardiologist:  Yvonne Kendall, MD  Patient Profile    Kelsey Klein is a 52 y.o. female who presents to the clinic today for routine follow up.     Past medical history significant for: CAD. LHC 02/08/2022 (NSTEMI): Mid to distal LAD 95%.  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  PCI with DES 2.25 x 18 mm to mid to distal LAD. Echo 02/08/2022: EF 55%.  No RWMA.  Normal RV function.  Mild MR. Hyperlipidemia.  Lipid panel 04/29/2022: LDL 66, HDL 76, TG 76, total 157. Breast cancer in the left. Bilateral mastectomy with reconstructive surgery.     History of Present Illness    Kelsey Klein is followed by Dr. Okey Dupre for the above outlined history.  Patient was admitted to [facility] in June 2023 with chest pressure and associated shortness of breath, flushing and diaphoresis.  Troponin peak 224.  CTA chest negative.  Echo showed normal LV function with no RWMA, mild LVH, trivial MR.  Lexiscan showed no evidence of ischemia, low risk study.  She underwent another admission August 2023 for chest pain with associated shortness of breath and nausea/vomiting.  Troponin peak 1188.  She was taken to the Cath Lab and underwent PCI with DES to mid to distal LAD.  Patient was last seen in the office by Cadence Furth, PA-C on 07/30/2022 for routine follow-up.  She was doing well at that time and no medication changes were made.  Discussed the use of AI scribe software for clinical note transcription with the patient, who gave verbal consent to proceed.  The patient presents for routine follow up. Patient reports with occasional shortness of breath and fatigue. She describes the shortness of breath as not being as severe as during her heart attack, but more of a sudden feeling of tiredness and needing to take a deep breath. These episodes occur  randomly and have happened a few times since her last visit. The patient also reports poor sleep, often waking up to use the bathroom at least twice a night. She has tried to limit her liquid intake before bed, but still finds herself waking up. The patient has been physically active, doing strength training and occasional cardio at the gym, although she has not been to the gym in the past three weeks due to personal commitments. She has not taken Brilinta for several weeks. Patient also wonders if fatigue and dyspnea could be related to perimenopause. She reports she just recently started having regular periods after 6 months of no periods. She denies hot flashes. She works in a [employer].        ROS: All other systems reviewed and are otherwise negative except as noted in History of Present Illness.  Studies Reviewed    EKG Interpretation Date/Time:  Wednesday April 09 2023 14:04:18 EDT Ventricular Rate:  63 PR Interval:  172 QRS Duration:  68 QT Interval:  396 QTC Calculation: 405 R Axis:   17  Text Interpretation: Normal sinus rhythm Nonspecific T wave abnormality When compared with ECG of 09-Feb-2022 04:43, T wave inversion no longer evident in Inferior leads T wave inversion no longer evident in Lateral leads Confirmed by Carlos Levering [phone]) on 04/09/2023 2:08:29 PM        Physical Exam    VS:  BP 118/80 (BP Location: Left Arm, Patient Position:  Sitting, Cuff Size: Normal)   Pulse 63   Ht 5\' 2"  (1.575 m)   Wt 149 lb (67.6 kg)   SpO2 98%   BMI 27.25 kg/m  , BMI Body mass index is 27.25 kg/m.  GEN: Well nourished, well developed, in no acute distress. Neck: No JVD or carotid bruits. Cardiac:  RRR. No murmurs. No rubs or gallops.   Respiratory:  Respirations regular and unlabored. Clear to auscultation without rales, wheezing or rhonchi. GI: Soft, nontender, nondistended. Extremities: Radials/DP/PT 2+ and equal bilaterally. No clubbing or cyanosis. No edema.  Skin:  Warm and dry, no rash. Neuro: Strength intact.  Assessment & Plan      CAD/dyspnea S/p PCI with DES to mid to distal LAD August 2023.  Echo with normal LV/RV, mild MR. Patient denies chest pain. She reports intermittent shortness of breath, not associated with exertion or similar to symptoms experienced during heart attack. Brilinta discontinued several weeks ago. Lung are clear to auscultation today. This does not seem cardiac related. Discussed keeping a diary of episodes of dyspnea to see if she can relate them to anything in particular including certain foods. She agrees with this.   -Continue monitoring symptoms. -If symptoms persist or worsen she is instructed to contact the office. -Continue aspirin, metoprolol, Crestor, prn SL NTG.   Hyperlipidemia LDL November 2023 66, at goal. -Order fasting lipid panel and CMP. -Continue Crestor.        Disposition: Return in 6 months or sooner as needed.          Signed, Etta Grandchild. Tiaja Hagan, DNP, NP-C

## 2023-05-26 ENCOUNTER — Other Ambulatory Visit
Admission: RE | Admit: 2023-05-26 | Discharge: 2023-05-26 | Disposition: A | Discharge status: Home / Self Care | Payer: Medicaid Other | Attending: Student | Admitting: Student

## 2023-05-26 DIAGNOSIS — I2510 Atherosclerotic heart disease of native coronary artery without angina pectoris: Secondary | ICD-10-CM | POA: Diagnosis present

## 2023-05-26 LAB — LIPID PANEL
Cholesterol: 167 mg/dL (ref 0–200)
HDL: 84 mg/dL (ref >40)
LDL Cholesterol: 58 mg/dL (ref 0–99)
Total CHOL/HDL Ratio: 2 {ratio}
Triglycerides: 124 mg/dL (ref <150)
VLDL: 25 mg/dL (ref 0–40)

## 2023-05-26 LAB — COMPREHENSIVE METABOLIC PANEL
ALT: 16 U/L (ref 0–44)
AST: 20 U/L (ref 15–41)
Albumin: 3.9 g/dL (ref 3.5–5.0)
Alkaline Phosphatase: 61 U/L (ref 38–126)
Anion gap: 10 (ref 5–15)
BUN: 17 mg/dL (ref 6–20)
CO2: 24 mmol/L (ref 22–32)
Calcium: 9 mg/dL (ref 8.9–10.3)
Chloride: 107 mmol/L (ref 98–111)
Creatinine, Ser: 0.84 mg/dL (ref 0.44–1.00)
GFR, Estimated: >60 mL/min (ref >60)
Glucose, Bld: 89 mg/dL (ref 70–99)
Potassium: 4 mmol/L (ref 3.5–5.1)
Sodium: 141 mmol/L (ref 135–145)
Total Bilirubin: 0.6 mg/dL (ref <1.2)
Total Protein: 6.7 g/dL (ref 6.5–8.1)

## 2023-10-03 NOTE — Progress Notes (Signed)
 Cardiology Clinic Note   Date: 10/06/2023 ID: Kelsey Klein, DOB 11/21/1970, MRN [id number]  Primary Cardiologist:  Sammy Crisp, MD  Chief Complaint   Kelsey Klein is a 53 y.o. female who presents to the clinic today for routine follow up.   Patient Profile   Kelsey Klein is followed by Dr/ End for the history outlined below.      Past medical history significant for: CAD. LHC 02/08/2022 (NSTEMI): Mid to distal LAD 95%.  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  PCI with DES 2.25 x 18 mm to mid to distal LAD. Echo 02/08/2022: EF 55%.  No RWMA.  Normal RV function.  Mild MR. Hyperlipidemia.  Lipid panel 05/26/2023: LDL 50, HDL 84, TG 124, total 167. Breast cancer in the left. Bilateral mastectomy with reconstructive surgery.   In summary, patient was admitted to [facility] in May 2023 with chest pressure and associated shortness of breath, flushing and diaphoresis. Troponin peak 224. CTA chest negative. Echo showed normal LV function with no RWMA, mild LVH, trivial MR. Lexiscan  showed no evidence of ischemia, low risk study. She underwent another admission August 2023 for chest pain with associated shortness of breath and nausea/vomiting. Troponin peak 1188. She was taken to the Cath Lab and underwent PCI with DES to mid to distal LAD.   Patient was last seen in the office by me on 04/09/2023 for routine follow-up.  Patient reported occasional shortness of breath and fatigue described as a sudden feeling of tiredness and needing to take a deep breath.  She reported poor sleep waking up at least twice to use the restroom during the night.  She was active doing strength training and occasional cardio at the gym.  Further workup was deferred in favor of monitoring symptoms.     History of Present Illness    Today, patient reports continued episodes of fatigue, shortness of breath and chest tightness. She reports this can be  associated with hot flashes. Sensation is similar to symptoms she had with NSTEMI but not as intense. She has symptoms with and without exertion. 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 She exercises inconsistently and has not done any in the last 3 weeks. She reports edema in bilateral feet if she sits for long periods of time. No orthopnea or PND.     ROS: All other systems reviewed and are otherwise negative except as noted in History of Present Illness.  EKGs/Labs Reviewed    EKG Interpretation Date/Time:  Monday October 06 2023 14:15:17 EDT Ventricular Rate:  77 PR Interval:  144 QRS Duration:  64 QT Interval:  368 QTC Calculation: 416 R Axis:   19  Text Interpretation: Normal sinus rhythm Low voltage QRS Possible Inferior infarct , age undetermined Cannot rule out Anterior infarct , age undetermined When compared with ECG of 09-Apr-2023 14:04, No significant change Confirmed by Morey Ar [phone]) on 10/06/2023 2:23:38 PM   05/26/2023: ALT 16; AST 20; BUN 17; Creatinine, Ser 0.84; Potassium 4.0; Sodium 141    Physical Exam    VS:  BP 116/68   Pulse 77   Ht 5\' 3"  (1.6 m)   Wt 141 lb 6.4 oz (64.1 kg)   SpO2 97%   BMI 25.05 kg/m  , BMI Body mass index is 25.05 kg/m.  GEN: Well nourished, well developed, in no acute distress. Neck: No JVD or carotid bruits. Cardiac:  RRR. No murmurs. No rubs or gallops.   Respiratory:  Respirations regular and unlabored. Clear to auscultation without rales, wheezing or rhonchi. GI: Soft, nontender, nondistended. Extremities: Radials/DP/PT 2+ and equal bilaterally. No clubbing or cyanosis. No edema.  Skin: Warm and dry, no rash. Neuro: Strength intact.  Assessment & Plan   CAD/dyspnea S/p PCI with DES to mid to distal LAD August 2023.  Echo with normal LV/RV, mild MR. Patient reports continued episodes of fatigue, shortness of breath and chest tightness. This can occur when she is having a menopausal hot flash  or with/without exertion. She had these same symptoms when she had NSTEMI just not as intense. Given persistence of symptoms and anginal equivalent will further evaluate with PET stress.  -Schedule PET stress.  -Continue aspirin , metoprolol , Crestor , prn SL NTG.    Hyperlipidemia LDL 50 December 2024, at goal. -Continue Crestor .   Disposition: Cardiac PET stress. Return in 6 weeks after testing or sooner as needed.      Informed Consent   Shared Decision Making/Informed Consent The risks [chest pain, shortness of breath, cardiac arrhythmias, dizziness, blood pressure fluctuations, myocardial infarction, stroke/transient ischemic attack, nausea, vomiting, allergic reaction, radiation exposure, metallic taste sensation and life-threatening complications (estimated to be 1 in 10,000)], benefits (risk stratification, diagnosing coronary artery disease, treatment guidance) and alternatives of a cardiac PET stress test were discussed in detail with Kelsey Klein and she agrees to proceed.      Signed, [name], DNP, NP-C

## 2023-10-06 ENCOUNTER — Encounter: Payer: Self-pay | Admitting: Student

## 2023-10-06 ENCOUNTER — Ambulatory Visit: Payer: Medicaid Other | Attending: Student | Admitting: Student

## 2023-10-06 VITALS — BP 116/68 | HR 77 | Ht 63.0 in | Wt 141.4 lb

## 2023-10-06 DIAGNOSIS — I25118 Atherosclerotic heart disease of native coronary artery with other forms of angina pectoris: Secondary | ICD-10-CM | POA: Diagnosis not present

## 2023-10-06 DIAGNOSIS — R0602 Shortness of breath: Secondary | ICD-10-CM | POA: Diagnosis not present

## 2023-10-06 DIAGNOSIS — E785 Hyperlipidemia, unspecified: Secondary | ICD-10-CM

## 2023-10-06 NOTE — Patient Instructions (Addendum)
 Medication Instructions:  Your Physician recommend you continue on your current medication as directed.    *If you need a refill on your cardiac medications before your next appointment, please call your pharmacy*   Testing/Procedures:    Please report to Radiology at the [facility] Main Entrance 30 minutes early for your test.  [address] [place], [place] [address]                         OR   Please report to Radiology at [facility] Main Entrance, medical mall, 30 mins prior to your test.  [address]  [place], [place]  How to Prepare for Your Cardiac PET/CT Stress Test:  Nothing to eat or drink, except water, 3 hours prior to arrival time.  NO caffeine/decaffeinated products, or chocolate 12 hours prior to arrival. (Please note decaffeinated beverages (teas/coffees) still contain caffeine).  If you have caffeine within 12 hours prior, the test will need to be rescheduled.  Medication instructions: Do not take erectile dysfunction medications for 72 hours prior to test (sildenafil, tadalafil) Do not take nitrates (isosorbide mononitrate, Ranexa, nitroglycerin ) the day before or day of test Do not take tamsulosin the day before or morning of test Hold theophylline containing medications for 12 hours. Hold Dipyridamole 48 hours prior to the test.   You may take your remaining medications with water.  NO perfume, cologne or lotion on chest or abdomen area. FEMALES - Please avoid wearing dresses to this appointment.  Total time is 1 to 2 hours; you may want to bring reading material for the waiting time.  IF YOU THINK YOU MAY BE PREGNANT, OR ARE NURSING PLEASE INFORM THE TECHNOLOGIST.  In preparation for your appointment, medication and supplies will be purchased.  Appointment availability is limited, so if you need to cancel or reschedule, please call the Radiology Department Scheduler at [phone] 24 hours in advance to avoid a  cancellation fee of $100.00  What to Expect When you Arrive:  Once you arrive and check in for your appointment, you will be taken to a preparation room within the Radiology Department.  A technologist or Nurse will obtain your medical history, verify that you are correctly prepped for the exam, and explain the procedure.  Afterwards, an IV will be started in your arm and electrodes will be placed on your skin for EKG monitoring during the stress portion of the exam. Then you will be escorted to the PET/CT scanner.  There, staff will get you positioned on the scanner and obtain a blood pressure and EKG.  During the exam, you will continue to be connected to the EKG and blood pressure machines.  A small, safe amount of a radioactive tracer will be injected in your IV to obtain a series of pictures of your heart along with an injection of a stress agent.    After your Exam:  It is recommended that you eat a meal and drink a caffeinated beverage to counter act any effects of the stress agent.  Drink plenty of fluids for the remainder of the day and urinate frequently for the first couple of hours after the exam.  Your doctor will inform you of your test results within 7-10 business days.  For more information and frequently asked questions, please visit our website: [email or web address]  For questions about your test or how to prepare for your test, please call: Cardiac Imaging Nurse Navigators Office:  [phone]   Follow-Up: At [facility], you and your health needs are our priority.  As part of our continuing mission to provide you with exceptional heart care, our providers are all part of one team.  This team includes your primary Cardiologist (physician) and Advanced Practice Providers or APPs (Physician Assistants and Nurse Practitioners) who all work together to provide you with the care you need, when you need it.  Your next appointment:   6 week(s)  Provider:    Morey Ar, NP

## 2023-10-06 NOTE — Addendum Note (Signed)
 Addended by: Katrice Goel on: 10/06/2023 04:59 PM   Modules accepted: Orders

## 2023-11-15 NOTE — Progress Notes (Deleted)
   Cardiology Clinic Note   Date: 11/15/2023 ID: ZIASIA LENOIR, DOB 1971/03/31, MRN [id number]  Primary Cardiologist:  Sammy Crisp, MD  Chief Complaint   Kelsey Klein is a 53 y.o. female who presents to the clinic today for ***  Patient Profile   Kelsey Klein is followed by *** for the history outlined below.      Past medical history significant for: CAD. LHC 02/08/2022 (NSTEMI): Mid to distal LAD 95%.  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  PCI with DES 2.25 x 18 mm to mid to distal LAD. Echo 02/08/2022: EF 55%.  No RWMA.  Normal RV function.  Mild MR. Hyperlipidemia.  Lipid panel 05/26/2023: LDL 50, HDL 84, TG 124, total 167. Breast cancer in the left. Bilateral mastectomy with reconstructive surgery.   In summary, patient was admitted to [facility] in May 2023 with chest pressure and associated shortness of breath, flushing and diaphoresis. Troponin peak 224. CTA chest negative. Echo showed normal LV function with no RWMA, mild LVH, trivial MR. Lexiscan  showed no evidence of ischemia, low risk study. She underwent another admission August 2023 for chest pain with associated shortness of breath and nausea/vomiting. Troponin peak 1188. She was taken to the Cath Lab and underwent PCI with DES to mid to distal LAD.   Patient was last seen in the office by me on 04/09/2023 for routine follow-up.  Patient reported occasional shortness of breath and fatigue described as a sudden feeling of tiredness and needing to take a deep breath.  She reported poor sleep waking up at least twice to use the restroom during the night.  She was active doing strength training and occasional cardio at the gym.  Further workup was deferred in favor of monitoring symptoms.   Patient was last seen in the office by ***     History of Present Illness    Today, patient ***  ***  ROS: All other systems reviewed and are otherwise negative except as  noted in History of Present Illness.  EKGs/Labs Reviewed        05/26/2023: ALT 16; AST 20; BUN 17; Creatinine, Ser 0.84; Potassium 4.0; Sodium 141   No results found for requested labs within last 365 days.   No results found for requested labs within last 365 days.   No results found for requested labs within last 365 days.  ***  Risk Assessment/Calculations    {Does this patient have ATRIAL FIBRILLATION?:[id number]} No BP recorded.  {Refresh Note OR Click here to enter BP  :1}***        Physical Exam    VS:  There were no vitals taken for this visit. , BMI There is no height or weight on file to calculate BMI.  GEN: Well nourished, well developed, in no acute distress. Neck: No JVD or carotid bruits. Cardiac: *** RRR. *** No murmurs. No rubs or gallops.   Respiratory:  Respirations regular and unlabored. Clear to auscultation without rales, wheezing or rhonchi. GI: Soft, nontender, nondistended. Extremities: Radials/DP/PT 2+ and equal bilaterally. No clubbing or cyanosis. No edema ***  Skin: Warm and dry, no rash. Neuro: Strength intact.  Assessment & Plan   ***  Disposition: ***     {Are you ordering a CV Procedure (e.g. stress test, cath, DCCV, TEE, etc)?   Press F2        :[id number]}   Signed, Lonell Rives. Urbano Milhouse, DNP, NP-C

## 2023-11-17 ENCOUNTER — Ambulatory Visit: Admitting: Student

## 2023-11-17 ENCOUNTER — Telehealth: Payer: Self-pay | Admitting: Emergency Medicine

## 2023-11-17 NOTE — Telephone Encounter (Signed)
 Called and spoke with the patient to reschedule her appointmeint with Deborah Wittneborn after her PET on 11/20/23

## 2023-11-18 ENCOUNTER — Encounter (HOSPITAL_COMMUNITY): Payer: Self-pay

## 2023-11-19 ENCOUNTER — Telehealth (HOSPITAL_COMMUNITY): Payer: Self-pay | Admitting: *Deleted

## 2023-11-19 NOTE — Telephone Encounter (Signed)
 Reaching out to patient to offer assistance regarding upcoming cardiac imaging study; pt verbalizes understanding of appt date/time, parking situation and where to check in, pre-test NPO status and verified current allergies; name and call back number provided for further questions should they arise  Chase Copping RN Navigator Cardiac Imaging [facility] [phone] office [phone] cell  Patient aware to avoid caffeine 12 hours prior to her PET study.

## 2023-11-19 NOTE — Progress Notes (Deleted)
 Cardiology Clinic Note   Date: 11/19/2023 ID: Kelsey Klein, DOB 09-16-1970, MRN [id number]  Primary Cardiologist:  Lonni Hanson, MD  Chief Complaint   Kelsey Klein is a 53 y.o. female who presents to the clinic today for ***  Patient Profile   Kelsey Klein is followed by Dr. Hanson for the history outlined below.      Past medical history significant for: CAD. LHC 02/08/2022 (NSTEMI): Mid to distal LAD 95%.  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  PCI with DES 2.25 x 18 mm to mid to distal LAD. Echo 02/08/2022: EF 55%.  No RWMA.  Normal RV function.  Mild MR. Cardiac PET CT 11/20/2023: Normal LV perfusion.  Trivial pericardial effusion versus pericardial thickening is noted on attenuation correction CT images.  Normal, low risk study. Hyperlipidemia.  Lipid panel 05/26/2023: LDL 50, HDL 84, TG 124, total 167. Breast cancer in the left. Bilateral mastectomy with reconstructive surgery.   In summary, patient was admitted to [facility] in May 2023 with chest pressure and associated shortness of breath, flushing and diaphoresis. Troponin peak 224. CTA chest negative. Echo showed normal LV function with no RWMA, mild LVH, trivial MR. Lexiscan  showed no evidence of ischemia, low risk study. She underwent another admission August 2023 for chest pain with associated shortness of breath and nausea/vomiting. Troponin peak 1188. She was taken to the Cath Lab and underwent PCI with DES to mid to distal LAD.   Patient was last seen in the office by me on 04/09/2023 for routine follow-up.  Patient reported occasional shortness of breath and fatigue described as a sudden feeling of tiredness and needing to take a deep breath.  She reported poor sleep waking up at least twice to use the restroom during the night.  She was active doing strength training and occasional cardio at the gym.  Further workup was deferred in favor of monitoring symptoms.    Patient was last seen in the office by ***     History of Present Illness    Today, patient ***  CAD/dyspnea S/p PCI with DES to mid to distal LAD August 2023.  Echo with normal LV/RV, mild MR. Patient cardiac PET/CT was a normal, low risk study.  Patient*** -Continue aspirin , metoprolol , Crestor , prn SL NTG.    Hyperlipidemia LDL 50 December 2024, at goal. -Continue Crestor .  ROS: All other systems reviewed and are otherwise negative except as noted in History of Present Illness.  EKGs/Labs Reviewed        05/26/2023: ALT 16; AST 20; BUN 17; Creatinine, Ser 0.84; Potassium 4.0; Sodium 141   No results found for requested labs within last 365 days.   No results found for requested labs within last 365 days.   No results found for requested labs within last 365 days.  ***  Risk Assessment/Calculations    {Does this patient have ATRIAL FIBRILLATION?:[phone]} No BP recorded.  {Refresh Note OR Click here to enter BP  :1}***        Physical Exam    VS:  There were no vitals taken for this visit. , BMI There is no height or weight on file to calculate BMI.  GEN: Well nourished, well developed, in no acute distress. Neck: No JVD or carotid bruits. Cardiac: *** RRR. *** No murmurs. No rubs or gallops.   Respiratory:  Respirations regular and unlabored. Clear to auscultation without rales, wheezing or rhonchi. GI: Soft, nontender, nondistended.  Extremities: Radials/DP/PT 2+ and equal bilaterally. No clubbing or cyanosis. No edema ***  Skin: Warm and dry, no rash. Neuro: Strength intact.  Assessment & Plan   ***  Disposition: ***     {Are you ordering a CV Procedure (e.g. stress test, cath, DCCV, TEE, etc)?   Press F2        :[id number]}   Signed, Barnie HERO. Kristopher Delk, DNP, NP-C

## 2023-11-20 ENCOUNTER — Ambulatory Visit
Admission: RE | Admit: 2023-11-20 | Discharge: 2023-11-20 | Disposition: A | Discharge status: Home / Self Care | Source: Ambulatory Visit | Attending: Student | Admitting: Student

## 2023-11-20 DIAGNOSIS — I25118 Atherosclerotic heart disease of native coronary artery with other forms of angina pectoris: Secondary | ICD-10-CM | POA: Diagnosis present

## 2023-11-20 DIAGNOSIS — Z9882 Breast implant status: Secondary | ICD-10-CM | POA: Diagnosis not present

## 2023-11-20 LAB — NM PET CT CARDIAC PERFUSION MULTI W/ABSOLUTE BLOODFLOW
LV dias vol: 66 mL (ref 46–106)
LV sys vol: 21 mL
MBFR: 4.56
Nuc Rest EF: 53 %
Nuc Stress EF: 68 %
Peak HR: 103 {beats}/min
Rest HR: 64 {beats}/min
Rest MBF: 0.54 ml/g/min
Rest Nuclear Isotope Dose: 16.8 mCi
SRS: 0
SSS: 0
Stress MBF: 2.46 ml/g/min
Stress Nuclear Isotope Dose: 16.6 mCi
TID: 1.03

## 2023-11-20 MED ORDER — RUBIDIUM RB82 GENERATOR (RUBYFILL)
25.0000 | PACK | Freq: Once | INTRAVENOUS | Status: AC
  Administered 2023-11-20: 16.77 via INTRAVENOUS

## 2023-11-20 MED ORDER — REGADENOSON 0.4 MG/5ML IV SOLN
INTRAVENOUS | Status: AC
  Filled 2023-11-20: qty 5

## 2023-11-20 MED ORDER — RUBIDIUM RB82 GENERATOR (RUBYFILL)
25.0000 | PACK | Freq: Once | INTRAVENOUS | Status: AC
  Administered 2023-11-20: 16.56 via INTRAVENOUS

## 2023-11-20 MED ORDER — REGADENOSON 0.4 MG/5ML IV SOLN
0.4000 mg | Freq: Once | INTRAVENOUS | Status: AC
  Administered 2023-11-20: 0.4 mg via INTRAVENOUS
  Filled 2023-11-20: qty 5

## 2023-11-26 ENCOUNTER — Ambulatory Visit: Admitting: Student

## 2023-11-27 ENCOUNTER — Ambulatory Visit: Payer: Self-pay | Admitting: Student

## 2023-12-02 ENCOUNTER — Encounter: Payer: Self-pay | Admitting: Emergency Medicine

## 2023-12-02 NOTE — Progress Notes (Signed)
 Letter sent.

## 2023-12-13 NOTE — Progress Notes (Unsigned)
 Cardiology Clinic Note   Date: 12/16/2023 ID: CHRISTIONNA POLAND, DOB 18-Oct-1970, MRN [id number]  Primary Cardiologist:  Lonni Hanson, MD  Chief Complaint   Kelsey Klein is a 53 y.o. female who presents to the clinic today for follow up after testing.   Patient Profile   Kelsey Klein is followed by Dr. Hanson for the history outlined below.      Past medical history significant for: CAD. LHC 02/08/2022 (NSTEMI): Mid to distal LAD 95%.  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  PCI with DES 2.25 x 18 mm to mid to distal LAD. Echo 02/08/2022: EF 55%.  No RWMA.  Normal RV function.  Mild MR. Cardiac PET CT 11/20/2023: Normal LV perfusion.  Trivial pericardial effusion versus pericardial thickening is noted on attenuation correction CT images.  Normal, low risk study. Hyperlipidemia.  Lipid panel 05/26/2023: LDL 50, HDL 84, TG 124, total 167. Breast cancer in the left. Bilateral mastectomy with reconstructive surgery.   In summary, patient was admitted to [facility] in May 2023 with chest pressure and associated shortness of breath, flushing and diaphoresis. Troponin peak 224. CTA chest negative. Echo showed normal LV function with no RWMA, mild LVH, trivial MR. Lexiscan  showed no evidence of ischemia, low risk study. She underwent another admission August 2023 for chest pain with associated shortness of breath and nausea/vomiting. Troponin peak 1188. She was taken to the Cath Lab and underwent PCI with DES to mid to distal LAD.   Patient was seen in the office on 04/09/2023 for routine follow-up.  Patient reported occasional shortness of breath and fatigue described as a sudden feeling of tiredness and needing to take a deep breath.  She reported poor sleep waking up at least twice to use the restroom during the night.  She was active doing strength training and occasional cardio at the gym.  Further workup was deferred in favor of monitoring  symptoms.   Patient was last seen in the office by 10/06/2023 for routine follow up. She reported continued episodes of fatigue, shortness of breath and chest tightness that were sometimes associated with hot flashes. Sensation is similar to when she had an NSTEMI but not as intense. Cardiac PET stress was a normal low risk study.      History of Present Illness    Today, patient continues to have occasional episodes of dyspnea with chest tightness unchanged from previous. Episodes are occurring less frequently (maybe once every 1-2 weeks) and last 10-15 seconds and are sometimes associated with hot flashes. She goes to the gym once a week for cardio and strength training. If she does not get to the gym she does exercises with a kettle bell at home. She has an upcoming visit with a new PCP in a few weeks. Discussed results of PET stress in detail. All questions answered.     ROS: All other systems reviewed and are otherwise negative except as noted in History of Present Illness.  EKGs/Labs Reviewed       EKG is not ordered today.   05/26/2023: ALT 16; AST 20; BUN 17; Creatinine, Ser 0.84; Potassium 4.0; Sodium 141    Physical Exam    VS:  BP 118/82 (BP Location: Left Arm, Patient Position: Sitting, Cuff Size: Normal)   Pulse 70   Resp 16   Ht 5' 3 (1.6 m)   Wt 143 lb (64.9 kg)   SpO2 100%   BMI 25.33 kg/m  ,  BMI Body mass index is 25.33 kg/m.  GEN: Well nourished, well developed, in no acute distress. Neck: No JVD or carotid bruits. Cardiac:  RRR.  No murmurs. No rubs or gallops.   Respiratory:  Respirations regular and unlabored. Clear to auscultation without rales, wheezing or rhonchi. GI: Soft, nontender, nondistended. Extremities: Radials/DP/PT 2+ and equal bilaterally. No clubbing or cyanosis. No edema.  Skin: Warm and dry, no rash. Neuro: Strength intact.  Assessment & Plan   CAD/dyspnea S/p PCI with DES to mid to distal LAD August 2023.  Echo with normal LV/RV, mild  MR. Patient cardiac PET/CT was a normal, low risk study.  Patient reports continued episodes of chest tightness and dyspnea occurring less frequently. She is active going to the gym once a week for cardio and weights.  - Increase physical activity as tolerated.  - Continue aspirin , metoprolol , Crestor , prn SL NTG.    Hyperlipidemia LDL 50 December 2024, at goal. -Continue Crestor .  Disposition: Return in 1 year or sooner as needed.          Signed, Barnie HERO. Kamylle Axelson, DNP, NP-C

## 2023-12-15 ENCOUNTER — Encounter: Payer: Self-pay | Admitting: *Deleted

## 2023-12-16 ENCOUNTER — Encounter: Payer: Self-pay | Admitting: Student

## 2023-12-16 ENCOUNTER — Ambulatory Visit: Attending: Student | Admitting: Student

## 2023-12-16 VITALS — BP 118/82 | HR 70 | Resp 16 | Ht 63.0 in | Wt 143.0 lb

## 2023-12-16 DIAGNOSIS — E785 Hyperlipidemia, unspecified: Secondary | ICD-10-CM | POA: Diagnosis present

## 2023-12-16 DIAGNOSIS — I2510 Atherosclerotic heart disease of native coronary artery without angina pectoris: Secondary | ICD-10-CM | POA: Diagnosis present

## 2023-12-16 DIAGNOSIS — R0602 Shortness of breath: Secondary | ICD-10-CM | POA: Insufficient documentation

## 2023-12-16 NOTE — Patient Instructions (Signed)
 Medication Instructions:  Your physician recommends that you continue on your current medications as directed. Please refer to the Current Medication list given to you today.   *If you need a refill on your cardiac medications before your next appointment, please call your pharmacy*  Lab Work: None ordered at this time  If you have labs (blood work) drawn today and your tests are completely normal, you will receive your results only by: MyChart Message (if you have MyChart) OR A paper copy in the mail If you have any lab test that is abnormal or we need to change your treatment, we will call you to review the results.  Testing/Procedures: None ordered at this time   Follow-Up: At [facility], you and your health needs are our priority.  As part of our continuing mission to provide you with exceptional heart care, our providers are all part of one team.  This team includes your primary Cardiologist (physician) and Advanced Practice Providers or APPs (Physician Assistants and Nurse Practitioners) who all work together to provide you with the care you need, when you need it.  Your next appointment:   1 year(s)  Provider:   Lonni Hanson, MD or Barnie Hila, NP    We recommend signing up for the patient portal called MyChart.  Sign up information is provided on this After Visit Summary.  MyChart is used to connect with patients for Virtual Visits (Telemedicine).  Patients are able to view lab/test results, encounter notes, upcoming appointments, etc.  Non-urgent messages can be sent to your provider as well.   To learn more about what you can do with MyChart, go to [email or web address].

## 2023-12-29 ENCOUNTER — Ambulatory Visit: Admitting: Internal Medicine

## 2023-12-31 ENCOUNTER — Encounter: Payer: Self-pay | Admitting: Internal Medicine

## 2023-12-31 ENCOUNTER — Other Ambulatory Visit: Payer: Self-pay

## 2023-12-31 ENCOUNTER — Ambulatory Visit: Admitting: Internal Medicine

## 2023-12-31 VITALS — BP 120/80 | HR 74 | Temp 97.9°F | Resp 16 | Ht 63.0 in | Wt 146.9 lb

## 2023-12-31 DIAGNOSIS — Z853 Personal history of malignant neoplasm of breast: Secondary | ICD-10-CM | POA: Diagnosis not present

## 2023-12-31 DIAGNOSIS — I2510 Atherosclerotic heart disease of native coronary artery without angina pectoris: Secondary | ICD-10-CM | POA: Diagnosis not present

## 2023-12-31 DIAGNOSIS — G5601 Carpal tunnel syndrome, right upper limb: Secondary | ICD-10-CM

## 2023-12-31 DIAGNOSIS — Z1211 Encounter for screening for malignant neoplasm of colon: Secondary | ICD-10-CM

## 2023-12-31 DIAGNOSIS — Z23 Encounter for immunization: Secondary | ICD-10-CM | POA: Diagnosis not present

## 2023-12-31 NOTE — Patient Instructions (Signed)
 Pinched Nerve in the Wrist (Carpal Tunnel Syndrome): What to Know  Pinched nerve in the wrist (carpal tunnel syndrome, or CTS) is a nerve problem that causes pain, numbness, and weakness in the wrist, hand, and fingers. The carpal tunnel is a narrow space that is on the palm side of your wrist. Repeated wrist motions or certain diseases may cause swelling in the tunnel. This swelling can pinch the main nerve in the wrist (the median nerve). What are the causes? CTS may be caused by: Moving your hand and wrist over and over again while doing a task. Hurting the wrist. Arthritis. A pocket of fluid (cyst) or a growth (tumor) in the carpal tunnel. Fluid buildup when you are pregnant. Use of tools that vibrate. In some cases, the cause of CTS is not known. What increases the risk? You're more likely to have CTS if: You have a job that makes you do these things: Move your hand firmly over and over again. Work with tools that vibrate, such as drills or sanders. You're female. You have diabetes, obesity, thyroid problems, or kidney failure. What are the signs or symptoms? Symptoms of this condition include: A tingling feeling in your fingers. You may feel this pain in the thumb, index finger, or middle finger. Tingling or loss of feeling in your hand. Pain in your entire arm. This pain may get worse when you bend your wrist and elbow for a long time. Pain in your wrist that goes up your arm to your shoulder. Pain that goes down into your palm or fingers. Weakness in your hands. You may find it hard to grab and hold items. Your symptoms may feel worse during the night. How is this diagnosed? CTS is diagnosed with a medical history and physical exam. Tests and imaging may also be done to: Check the electrical signals sent by your nerves into the muscles. Check how well electrical signals pass through your nerves. Check possible causes of your CTS. These include X-rays, ultrasound, and  MRI. How is this treated? CTS may be treated with: Lifestyle changes. You will be asked to stop or change the activity that caused your problem. Physical therapy. This may include: Exercises that stretch and strengthen the muscles and tendons in the wrist and hand. Nerve gliding or flossing exercises. These help keep nerves moving smoothly through the tissues around them. Occupational therapy. You'll learn how to use your hand again. Medicines for pain and swelling. You may have injections in your wrist. A wrist splint or brace. Surgery. Follow these instructions at home: If you have a splint or brace: Wear the splint or brace as told. Take it off only if your provider says you can. Check the skin around it every day. Tell your provider if you see problems. Loosen the splint or brace if your fingers tingle, are numb, or turn cold and blue. Keep the splint or brace clean and dry. If the splint or brace isn't waterproof: Do not let it get wet. Cover it when you take a bath or shower. Use a cover that doesn't let any water in. Managing pain, stiffness, and swelling  Use ice or an ice pack as told. If you have a splint or brace that you can take off, remove it only as told. Place a towel between your skin and the ice. Leave the ice on for 20 minutes, 2-3 times a day. If your skin turns red, take off the ice right away to prevent skin damage. The risk  of damage is higher if you can't feel pain, heat, or cold. Move your fingers often to reduce stiffness and swelling. General instructions Take your medicines only as told. Rest your wrist and hand from activity that may cause pain. If your CTS is caused by things you do at work, talk with your employer about making changes. For example, you may need a wrist pad to use while typing. Exercise as told. Follow instructions on how to do nerve gliding or flossing exercises. These help keep nerves in moving smoothly through the tissues around  them. Keep all follow-up visits. This is important. Where to find more information American Academy of Orthopedic Surgeons: orthoinfo.aaos.[employer] of Neurological Disorders and Stroke: BasicFM.no Contact a health care provider if: You have new symptoms. Your pain is not controlled with medicines. Your symptoms get worse. Get help right away if: Your hand or wrist tingles or is numb, and the symptoms become very bad. This information is not intended to replace advice given to you by your health care provider. Make sure you discuss any questions you have with your health care provider. Document Revised: 04/15/2023 Document Reviewed: 01/31/2023 Elsevier Patient Education  2024 [employer].

## 2023-12-31 NOTE — Progress Notes (Signed)
 New Patient Office Visit  Subjective    Patient ID: Kelsey Klein, female    DOB: 11/20/1970  Age: 53 y.o. MRN: [id number]  CC:  Chief Complaint  Patient presents with   Establish Care    HPI ANALIZ TVEDT presents to establish care.  Discussed the use of AI scribe software for clinical note transcription with the patient, who gave verbal consent to proceed.  History of Present Illness [name] is a 53 year old female with breast cancer and cardiovascular disease who presents for an annual physical exam and evaluation of hand pain.  She experiences pain, tingling, and numbness in her right hand, occasionally affecting the left, particularly at night. Symptoms have worsened over time, causing throbbing pain upon waking. The pain is primarily in her fingers, with numbness and tingling, and is worse in the middle fingers. 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 She manages inflammation through diet, including juicing and avoiding inflammatory foods. Her mother has arthritis and neuropathy.  She was diagnosed with breast cancer in 2017 and underwent a double mastectomy in 2018. She completed hormonal therapy and no longer requires mammograms.  She experienced a heart attack in 2023, underwent heart catheterization, and had one stent placed. She takes metoprolol , Crestor , baby aspirin , vitamin D, and oil of oregano.   Hx of Breast Cancer: -Stage 1a ER-PR positive, HER-2 negative  -s/p bilateral mastectomy with reconstruction in 2019 -Initially on Tamoxifen  but could not tolerate, then on Anastrozole  but completed treatment for 5 years and is no longer following with Oncology yearly. -No further mammograms needed.  CAD/HLD: -Following with Cardiology -Hx of NSTEMI - s/p PCI with DES to mid-distal LAD in August 2023. -Echo normal and PET/CT low risk -Medications: Metoprolol  12.5 mg BID, Crestor  20 mg, aspirin  and  Nitroglycerin  PRN -Patient is compliant with above medications and reports no side effects.  -Last lipid panel: Lipid Panel     Component Value Date/Time   CHOL 167 05/26/2023 0912   TRIG 124 05/26/2023 0912   HDL 84 05/26/2023 0912   CHOLHDL 2.0 05/26/2023 0912   VLDL 25 05/26/2023 0912   LDLCALC 58 05/26/2023 0912   LDLDIRECT 56 04/29/2022 0903   Vitamin D Deficiency:  -Currently on Vitamin D supplements 1000 international units  daily.   Health Maintenance: -Blood work UTD -Mammograms: complete -Colon cancer screening due -Pap due - will schedule -Tdap due   Outpatient Encounter Medications as of 12/31/2023  Medication Sig   aspirin  EC 81 MG tablet Take 1 tablet (81 mg total) by mouth daily. Swallow whole.   cholecalciferol (VITAMIN D3) 25 MCG (1000 UNIT) tablet Take 1,000 Units by mouth daily.   metoprolol  tartrate (LOPRESSOR ) 25 MG tablet Take 0.5 tablets (12.5 mg total) by mouth 2 (two) times daily.   nitroGLYCERIN  (NITROSTAT ) 0.4 MG SL tablet Place 1 tablet (0.4 mg total) under the tongue every 5 (five) minutes x 3 doses as needed for chest pain.   OIL OF OREGANO PO Take by mouth.   rosuvastatin  (CRESTOR ) 20 MG tablet Take 1 tablet (20 mg total) by mouth daily.   No facility-administered encounter medications on file as of 12/31/2023.    Past Medical History:  Diagnosis Date   Breast cancer, stage 1, estrogen receptor negative, right (HCC) 08/19/2015    18 mm; pT1c  pN0 (0/6nodes positive). ER/PR +; Her 2 neu not overexpressed   Family history of breast cancer  Heart attack [facility])     Past Surgical History:  Procedure Laterality Date   BREAST BIOPSY Left 05/27/2017   U/S core Bx- invasive mammary ER/PR positive Her2 negative   BREAST BIOPSY Left 05/27/2017   Affirm Bx- DCIS   BREAST RECONSTRUCTION WITH PLACEMENT OF TISSUE EXPANDER AND FLEX HD (ACELLULAR HYDRATED DERMIS) Bilateral 08/18/2017   Procedure: BREAST RECONSTRUCTION WITH PLACEMENT OF TISSUE EXPANDER  AND FLEX HD (ACELLULAR HYDRATED DERMIS);  Surgeon: Lowery Estefana RAMAN, DO;  Location: [facility] ORS;  Service: Plastics;  Laterality: Bilateral;   BREAST SURGERY Bilateral    double masectomy   CORONARY ARTERY BYPASS GRAFT     CORONARY STENT INTERVENTION N/A 02/08/2022   Procedure: CORONARY STENT INTERVENTION;  Surgeon: Mady Bruckner, MD;  Location: [facility] INVASIVE CV LAB;  Service: Cardiovascular;  Laterality: N/A;   LEFT HEART CATH AND CORONARY ANGIOGRAPHY N/A 02/08/2022   Procedure: LEFT HEART CATH AND CORONARY ANGIOGRAPHY;  Surgeon: Mady Bruckner, MD;  Location: [facility] CV [facility];  Service: Cardiovascular;  Laterality: N/A;   LIPOSUCTION WITH LIPOFILLING Bilateral 05/21/2018   Procedure: liposuction with fat grafting and revolve;  Surgeon: Lowery Estefana RAMAN, DO;  Location: [facility];  Service: Plastics;  Laterality: Bilateral;   LIPOSUCTION WITH LIPOFILLING Bilateral 02/23/2020   Procedure: Fat filling to bilateral breasts for symmetry;  Surgeon: Lowery Estefana RAMAN, DO;  Location: [facility];  Service: Plastics;  Laterality: Bilateral;   MASTECTOMY W/ SENTINEL NODE BIOPSY Bilateral 08/18/2017   Procedure: MASTECTOMY WITH SENTINEL LYMPH NODE BIOPSY,FROZEN SECTION;  Surgeon: Dessa Reyes ORN, MD;  Location: [facility];  Service: General;  Laterality: Bilateral;   REMOVAL OF TISSUE EXPANDER AND PLACEMENT OF IMPLANT Bilateral 12/10/2017   Procedure: REMOVAL OF TISSUE EXPANDERS AND PLACEMENT OF IMPLANTS;  Surgeon: Lowery Estefana RAMAN, DO;  Location:  SURGERY CENTER;  Service: Plastics;  Laterality: Bilateral;    Family History  Problem Relation Age of Onset   Arthritis Mother    Cirrhosis Father        deceased 50; adopted   Hypertension Father    Hypertension Sister    Alzheimer's disease Maternal Grandmother    Breast cancer Paternal Aunt        age at dx unknown   Breast cancer Paternal Aunt        age at dx unknown    Social  History   Socioeconomic History   Marital status: Single    Spouse name: Not on file   Number of children: 1   Years of education: Not on file   Highest education level: Not on file  Occupational History   Not on file  Tobacco Use   Smoking status: Never   Smokeless tobacco: Never  Vaping Use   Vaping status: Never Used  Substance and Sexual Activity   Alcohol use: Not Currently   Drug use: No   Sexual activity: Yes    Birth control/protection: Condom  Other Topics Concern   Not on file  Social History Narrative   Not on file   Social Drivers of Health   Financial Resource Strain: Not on file  Food Insecurity: Not on file  Transportation Needs: Not on file  Physical Activity: Not on file  Stress: Not on file  Social Connections: Not on file  Intimate Partner Violence: Not on file    Review of Systems  Musculoskeletal:  Positive for joint pain.  Neurological:  Positive for tingling.        Objective  BP 120/80 (Cuff Size: Normal)   Pulse 74   Temp 97.9 F (36.6 C) (Oral)   Resp 16   Ht 5' 3 (1.6 m)   Wt 146 lb 14.4 oz (66.6 kg)   SpO2 99%   BMI 26.02 kg/m   Physical Exam Constitutional:      Appearance: Normal appearance.  HENT:     Head: Normocephalic and atraumatic.     Mouth/Throat:     Mouth: Mucous membranes are moist.     Pharynx: Oropharynx is clear.  Eyes:     Extraocular Movements: Extraocular movements intact.     Conjunctiva/sclera: Conjunctivae normal.     Pupils: Pupils are equal, round, and reactive to light.  Cardiovascular:     Rate and Rhythm: Normal rate and regular rhythm.  Pulmonary:     Effort: Pulmonary effort is normal.     Breath sounds: Normal breath sounds.  Skin:    General: Skin is warm and dry.  Neurological:     General: No focal deficit present.     Mental Status: She is alert. Mental status is at baseline.  Psychiatric:        Mood and Affect: Mood normal.        Behavior: Behavior normal.          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 - Recommend night splint to maintain wrist in neutral position during sleep. - Advise on wrist stretches and massage. - Suggest topical analgesics like Bengay or Icy Hot for symptomatic relief. - Consider referral to orthopedic specialist if symptoms do not improve. - Discuss potential need for nerve conduction study for diagnosis.  Coronary Artery Disease Post-myocardial infarction with stent placement in 2023. Maintained on metoprolol , Crestor , and aspirin  with good cardiac function on recent tests. - Continue current medication regimen for secondary prevention.  Hx of Breast Cancer Stage 1A breast cancer treated with double mastectomy in 2018. Completed therapy and released from oncologist's care.  General Health Maintenance Discussed colon cancer screening options and overdue Pap smear. Tetanus vaccination status uncertain, second shingles vaccine needed. - Refer for colonoscopy for colon cancer screening. - Schedule Pap smear later this year. - Administer tetanus vaccine today. - Advise to obtain second shingles vaccine at pharmacy.  Follow-up Plan for follow-up visit in six months for Pap smear and annual labs. - Schedule follow-up appointment in six months for Pap smear and annual labs. - Advise to contact if symptoms worsen or for any questions. - Encourage fasting before next appointment for lab work.   - Ambulatory referral to Gastroenterology - Tdap vaccine greater than or equal to 7yo IM    Return in about 6 months (around 07/02/2024) for physical w/Pap.   Sharyle Fischer, DO

## 2024-03-05 IMAGING — CR DG CERVICAL SPINE COMPLETE 4+V
1 series · 5 of 5 positions shown · non-contrast
Comparison: None Available.

CLINICAL DATA: Pain and numbness, right hand. Progressively
worsening.

EXAM:
CERVICAL SPINE - COMPLETE 4+ VIEW

[Series 1: dg cervical spine complete · 0.14mm/px · 5 of 5 slices shown]
[im 1/5]
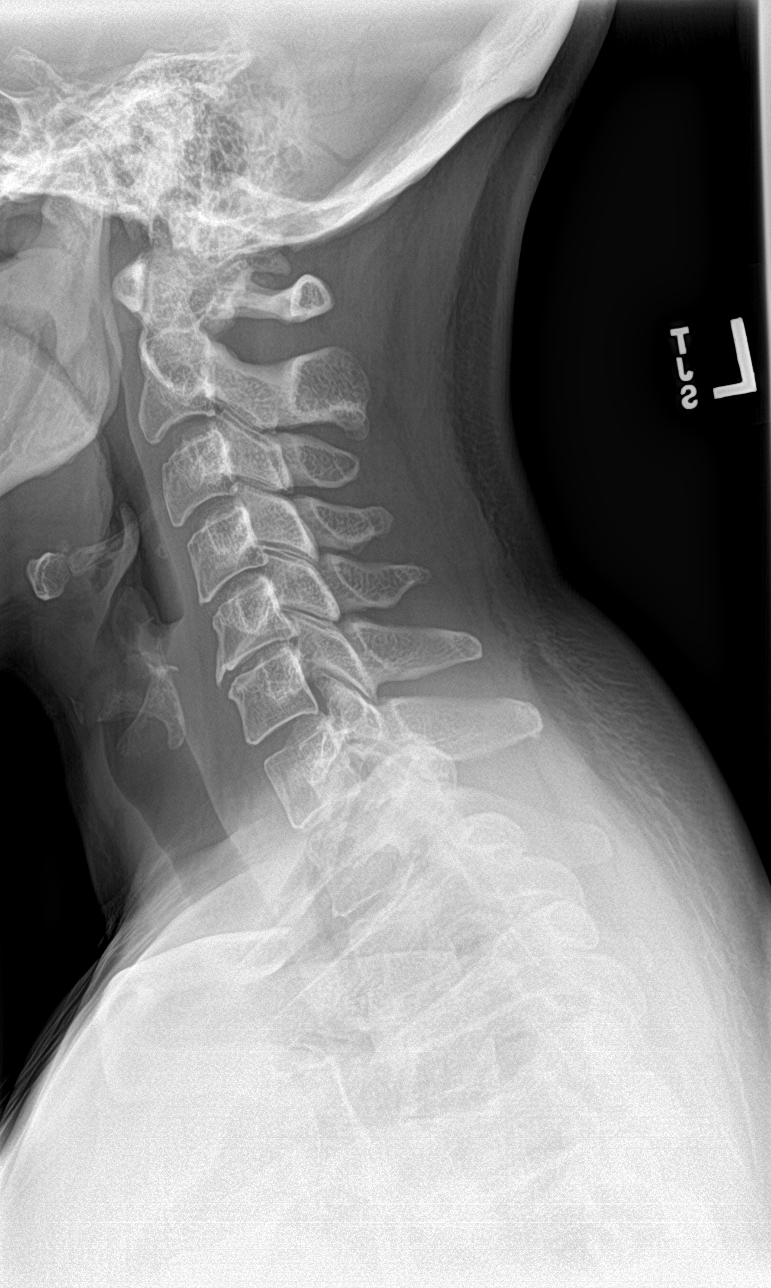
[im 2/5]
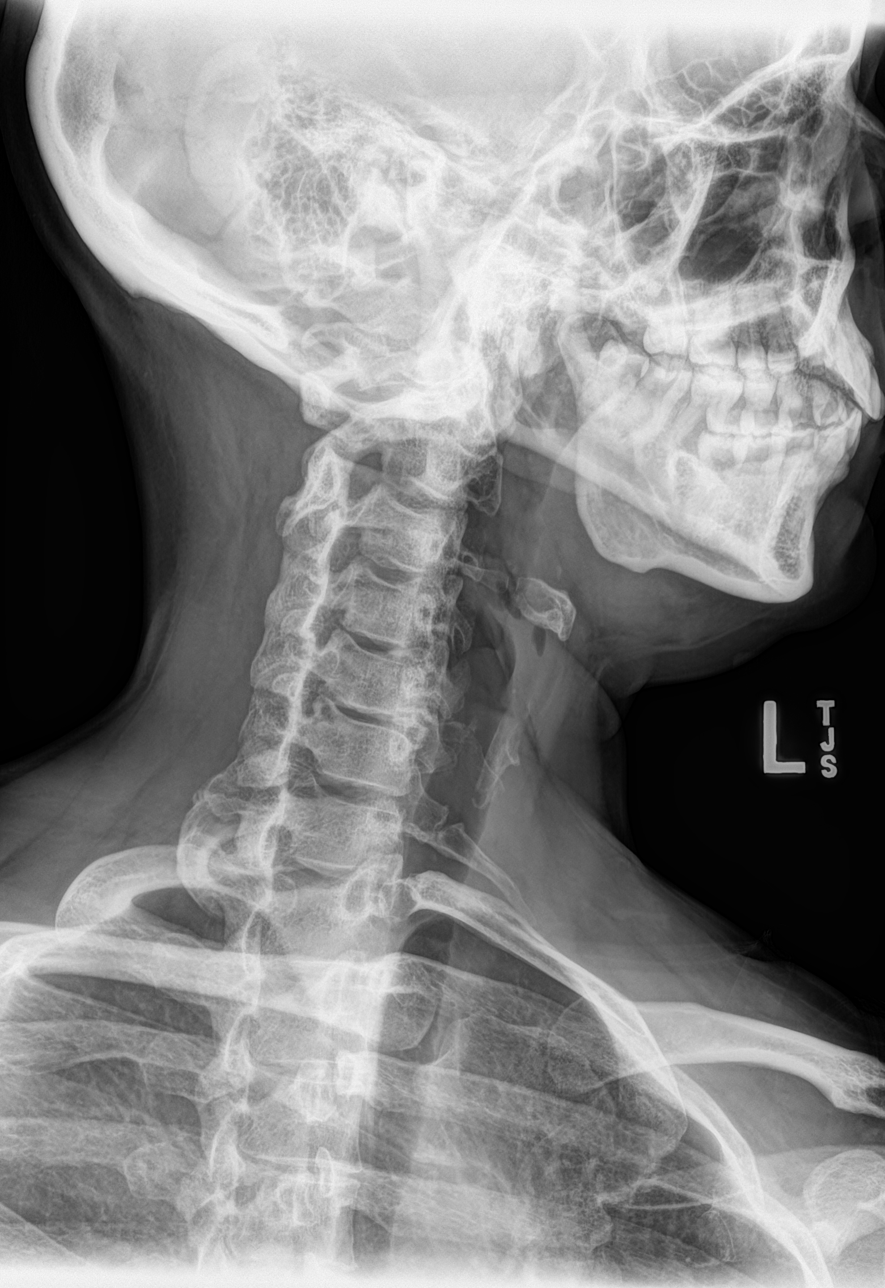
[im 3/5]
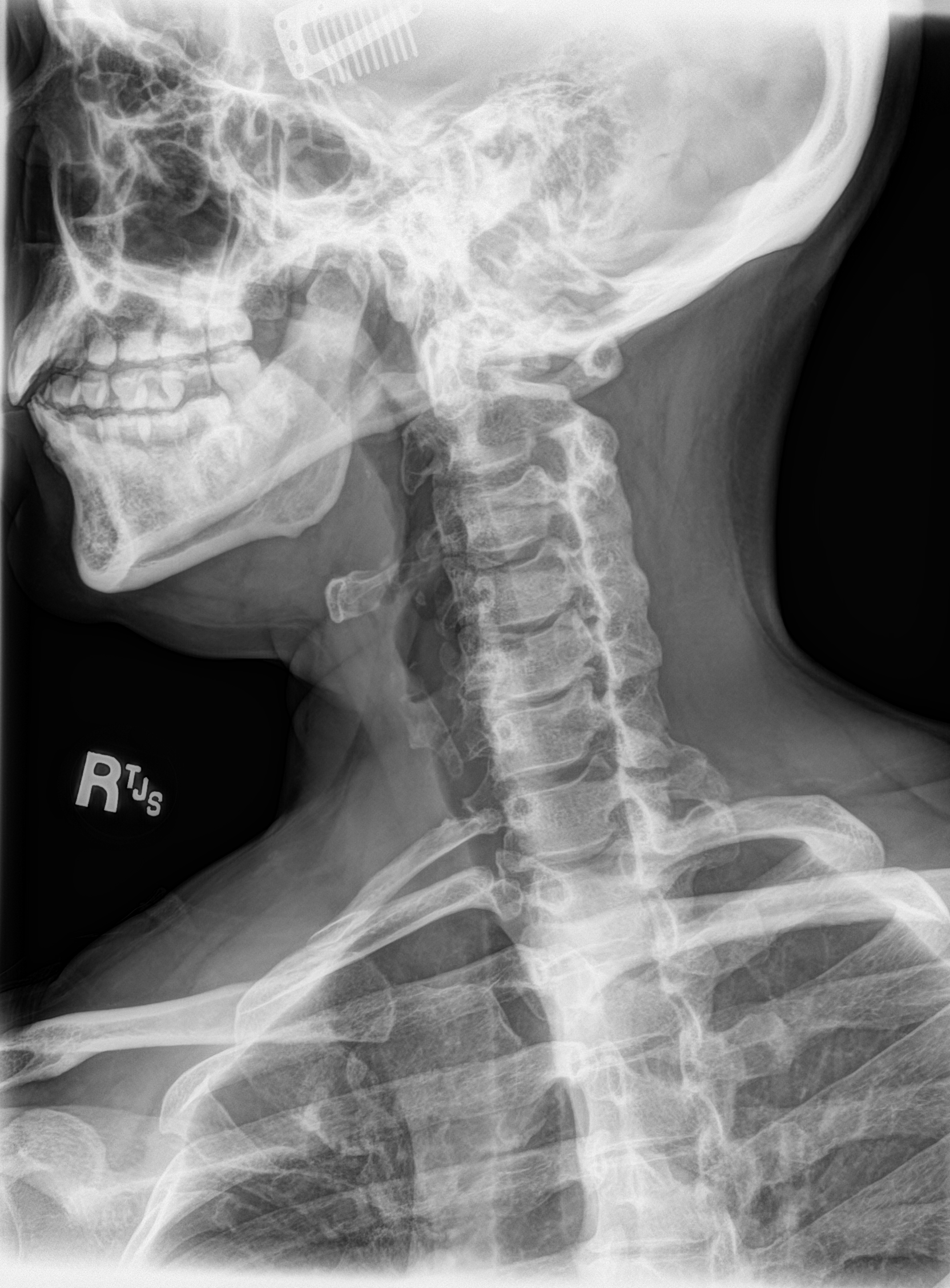
[im 4/5]
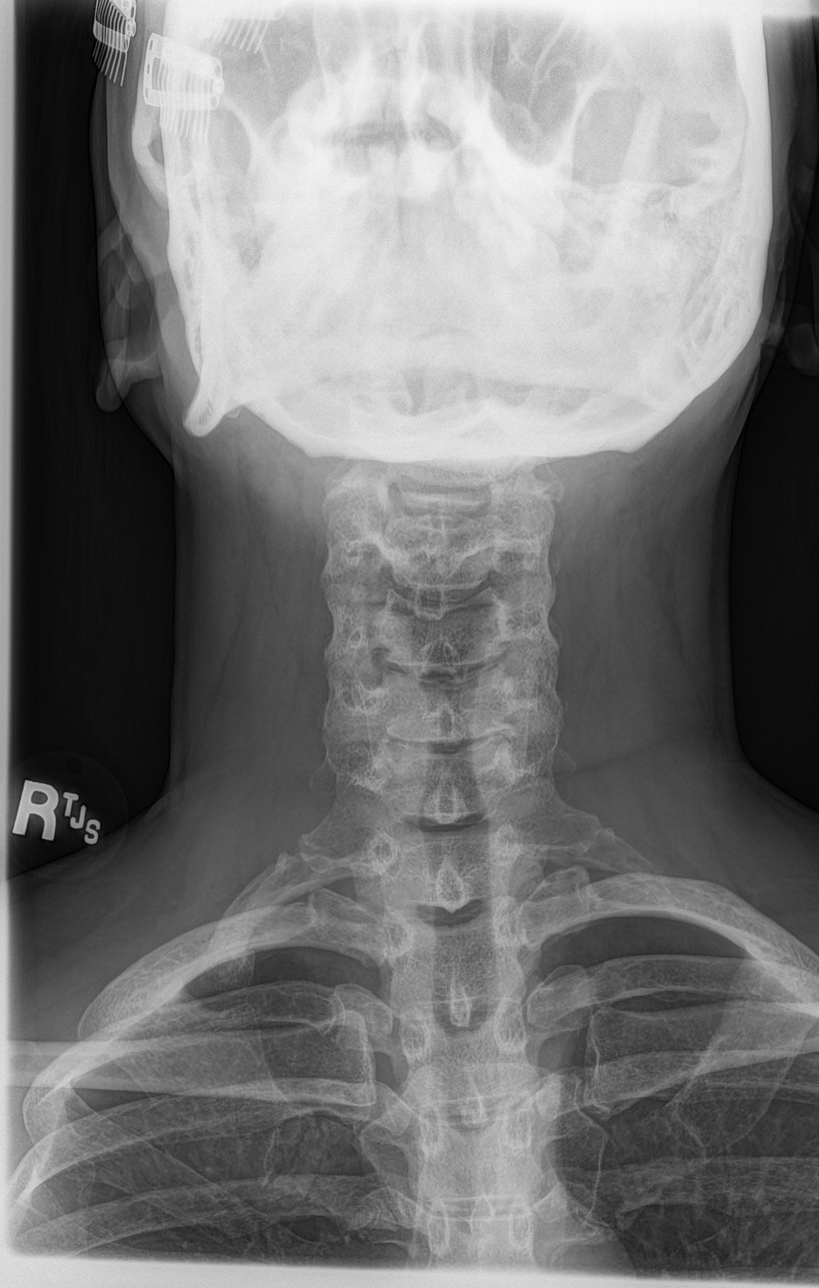
[im 5/5]
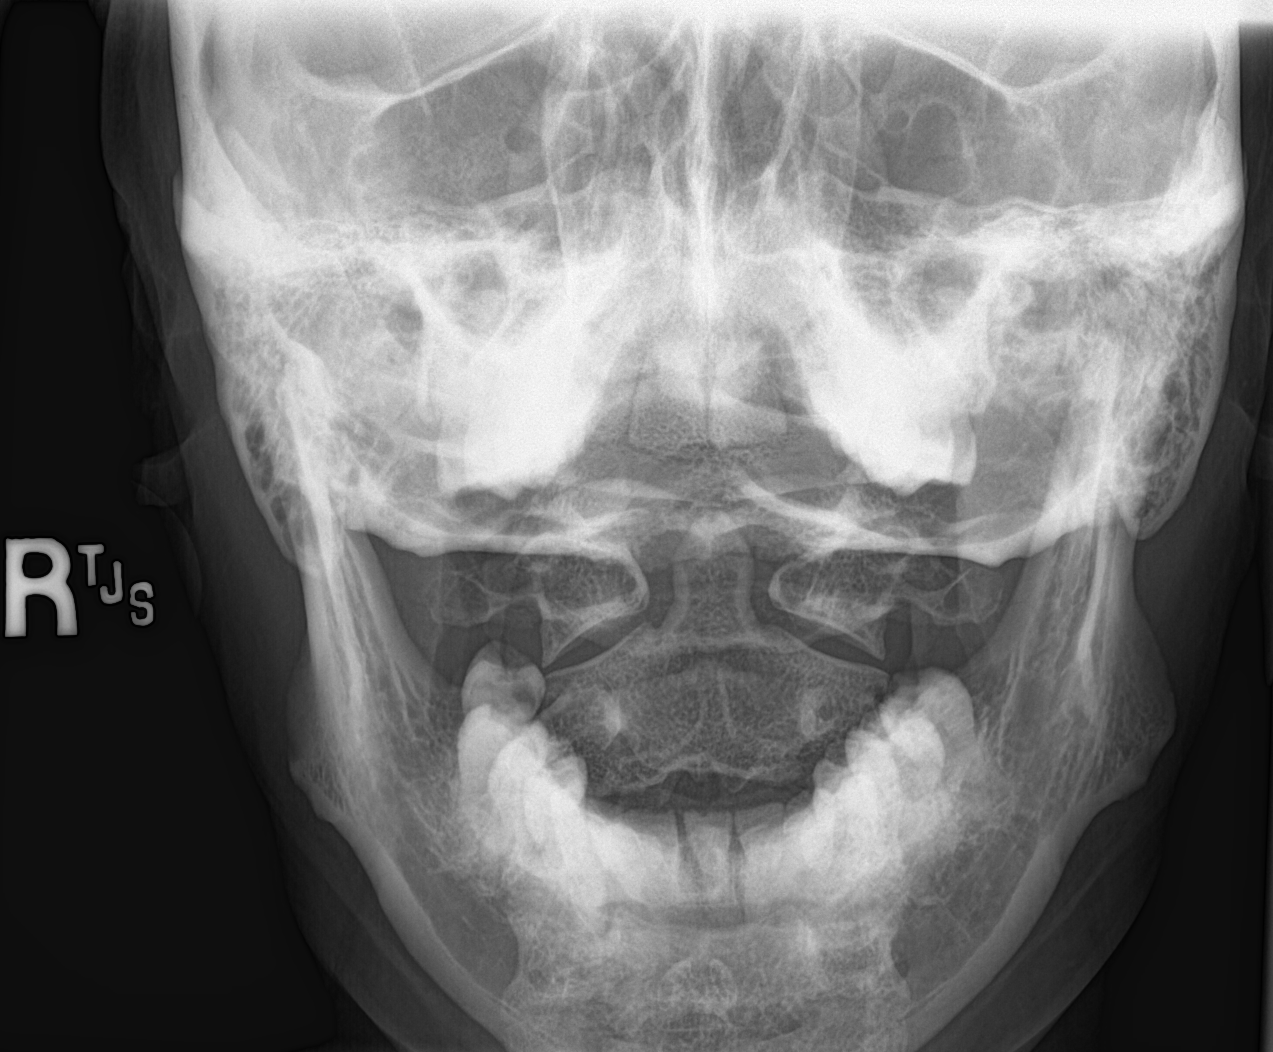

[5 of 5 positions shown; findings below may reference images not displayed]

FINDINGS: Seven cervical segments are well visualized. Vertebral body height
is well maintained. Osteophytic changes are noted at C5-6. Mild
neural foraminal narrowing is noted at C5-6 bilaterally. The
odontoid is within normal limits. No soft tissue abnormality is
seen.
IMPRESSION: Mild degenerative changes with neural foraminal narrowing at C5-6.

## 2024-03-06 IMAGING — CR DG CHEST 2V
2 series · 2 of 2 positions shown · non-contrast
Comparison: None Available.

CLINICAL DATA: RIGHT arm pain

EXAM:
CHEST - 2 VIEW

[chest pa]
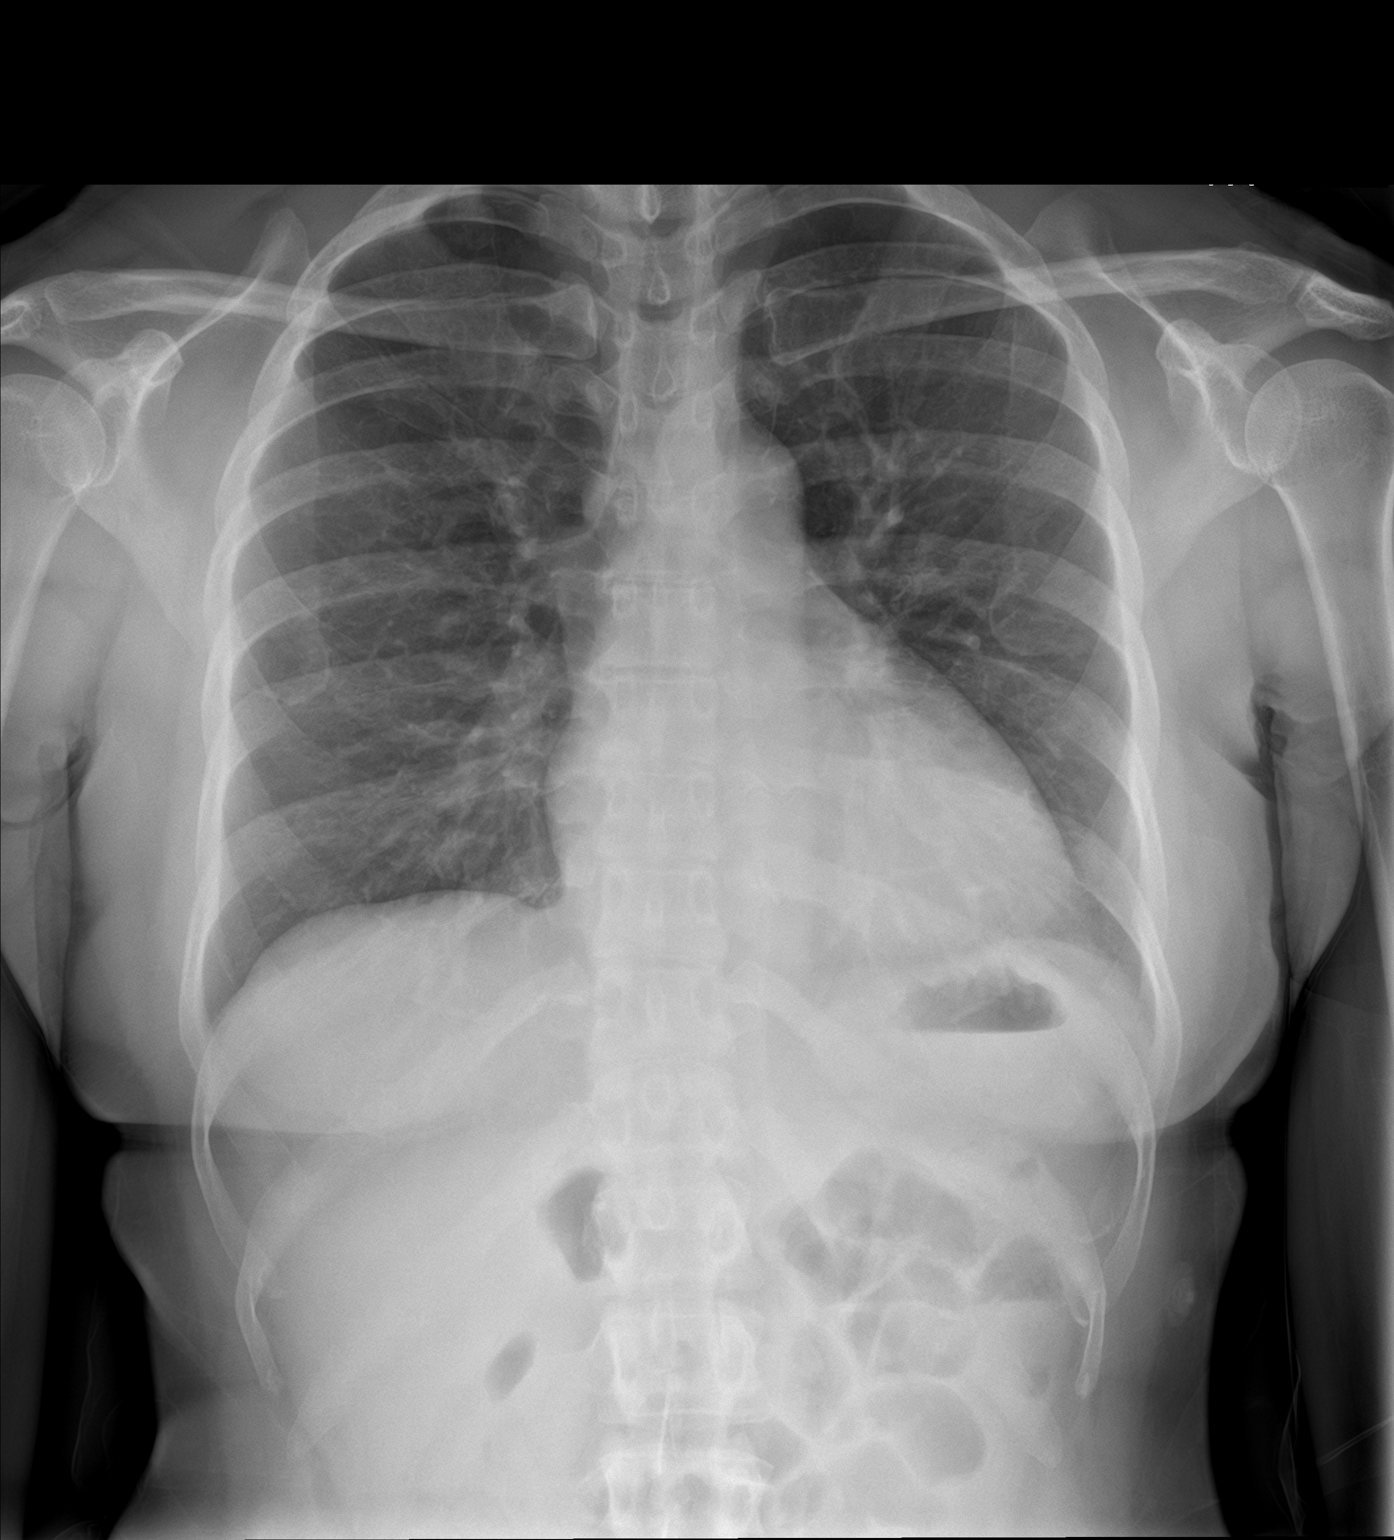

[chest lat]
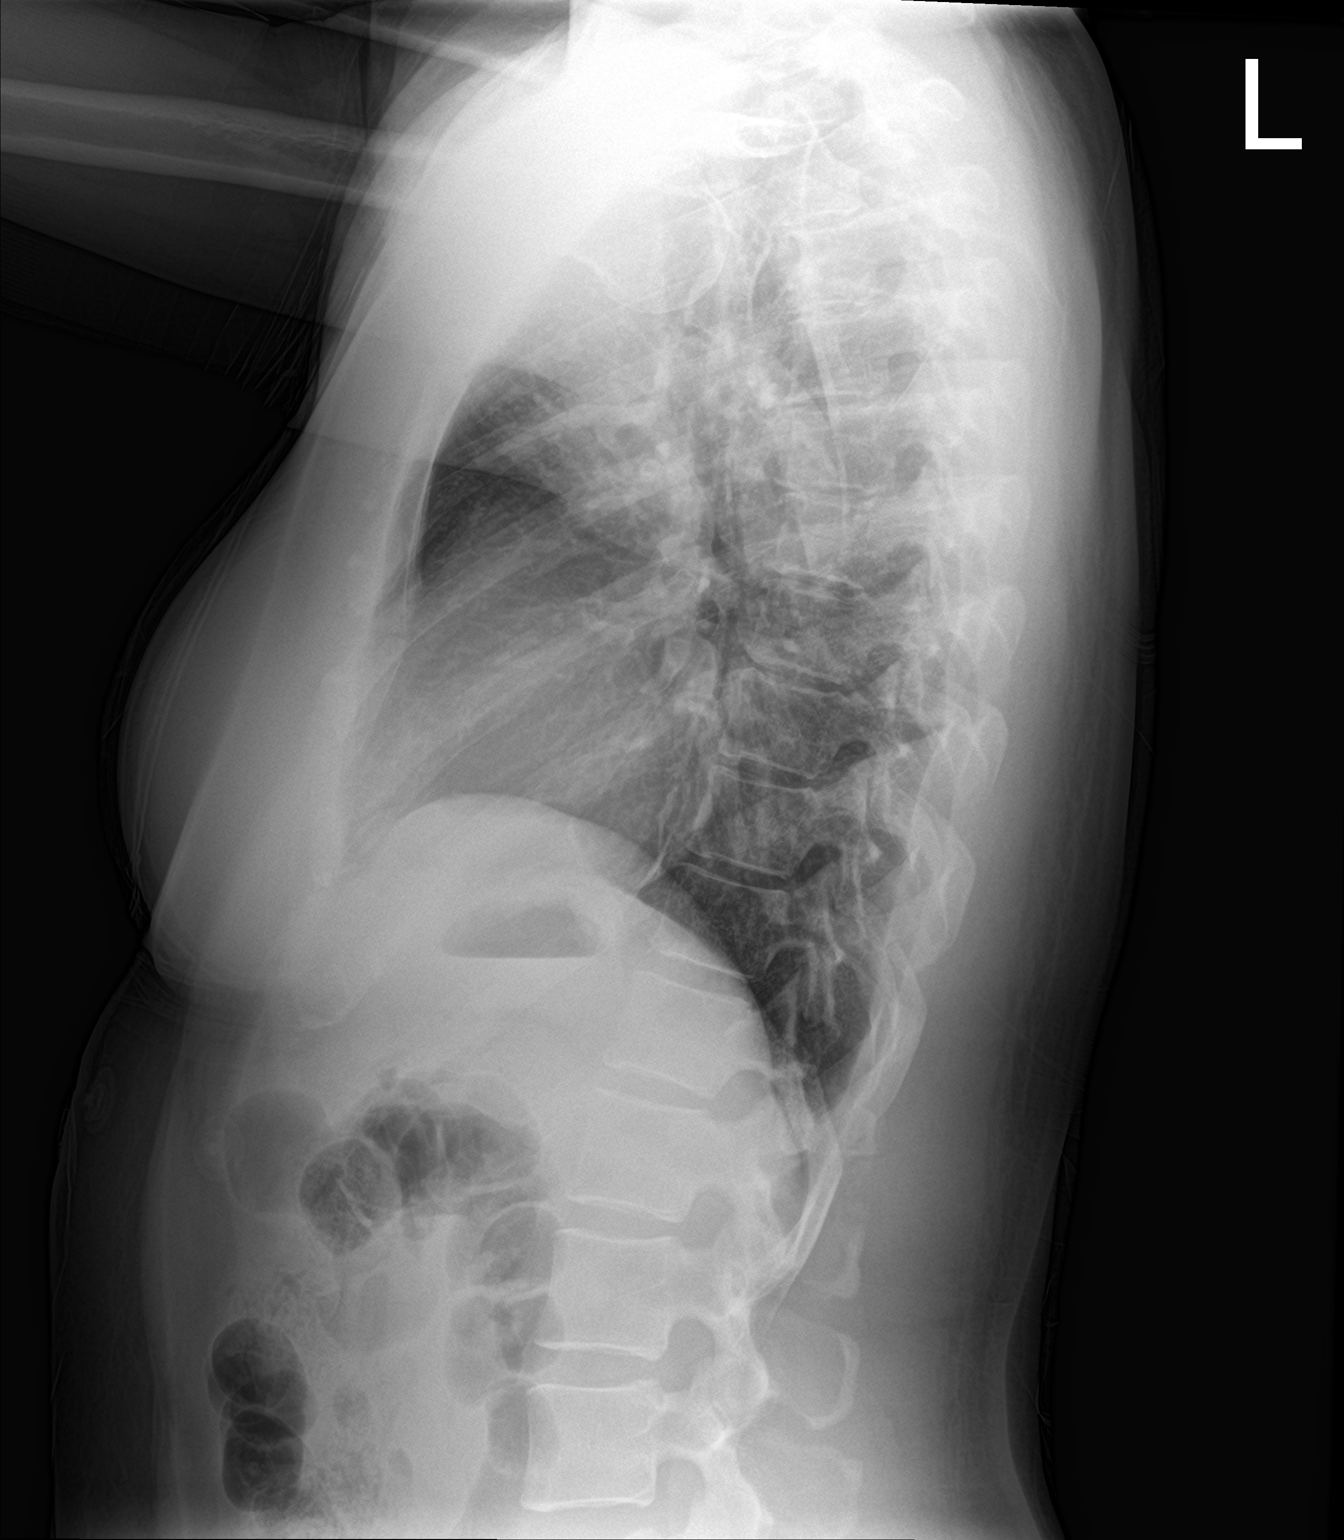

[2 of 2 positions shown; findings below may reference images not displayed]

FINDINGS: Normal mediastinum and cardiac silhouette. Normal pulmonary
vasculature. No evidence of effusion, infiltrate, or pneumothorax.
No acute bony abnormality.
IMPRESSION: No acute cardiopulmonary process.

## 2024-03-06 IMAGING — CT CT ANGIO CHEST
4 of 7 series · 19 of 46 positions shown · IV contrast (APPLIED)
Comparison: Chest radiograph from the same day.

CLINICAL DATA: Pulmonary embolism (PE) suspected, high prob

EXAM:
CT ANGIOGRAPHY CHEST WITH CONTRAST
TECHNIQUE: Multidetector CT imaging of the chest was performed using the
standard protocol during bolus administration of intravenous
contrast. Multiplanar CT image reconstructions and MIPs were
obtained to evaluate the vascular anatomy.

[Series 6: arterial · axial · arterial · 0.59mm/px · z∈[-636,-456]mm · 4 of 151 slices shown]
[im 31/151  lung]
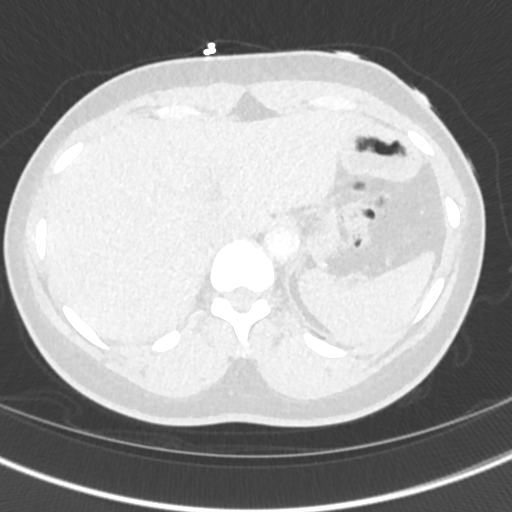
[im 61/151  soft-tissue]
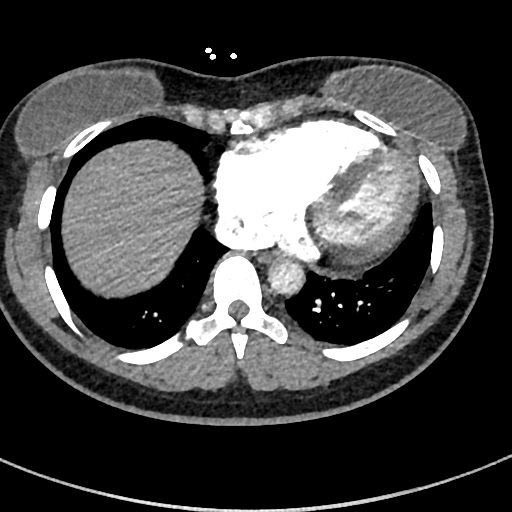
[im 91/151  lung]
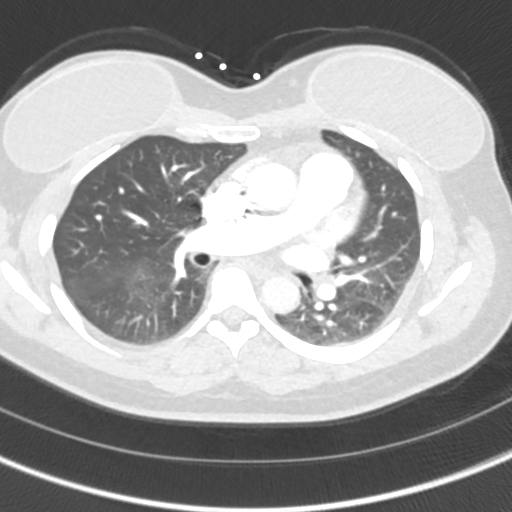
[im 121/151  soft-tissue]
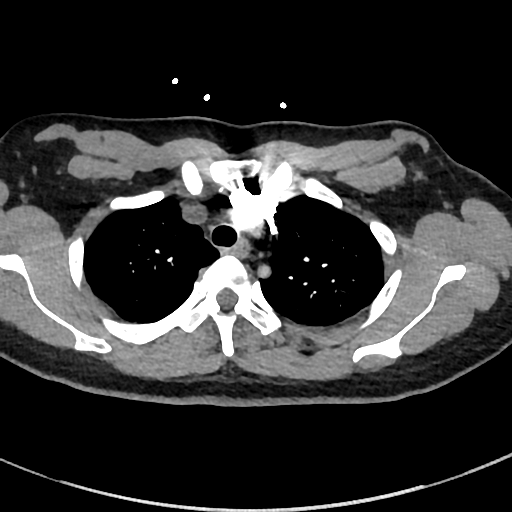

[Series 7: lung · axial · 0.59mm/px · z∈[-646,-496]mm · 4 of 151 slices shown]
[im 26/151  soft-tissue]
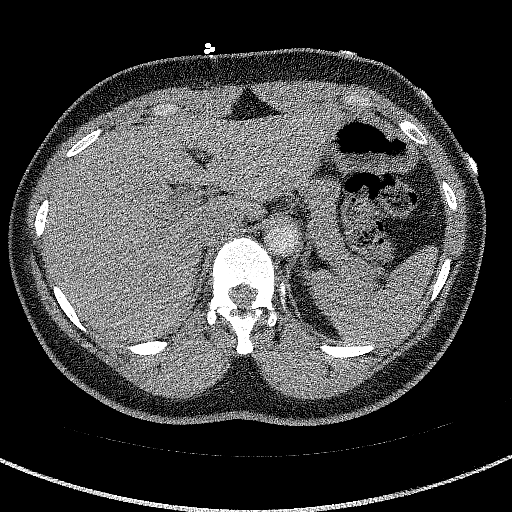
[im 51/151  soft-tissue]
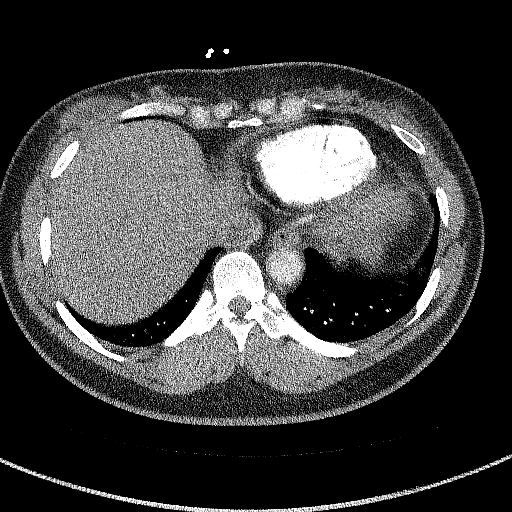
[im 76/151  soft-tissue]
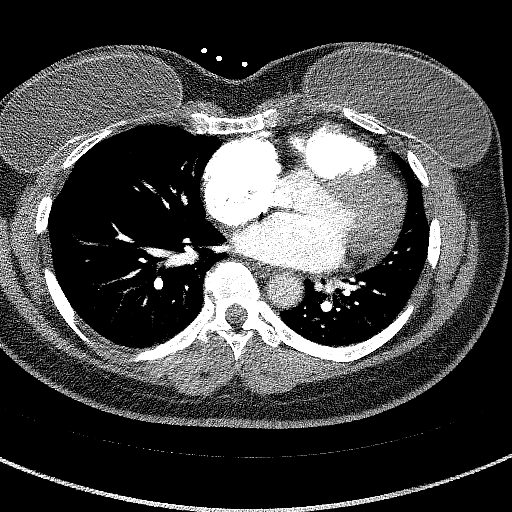
[im 101/151  soft-tissue]
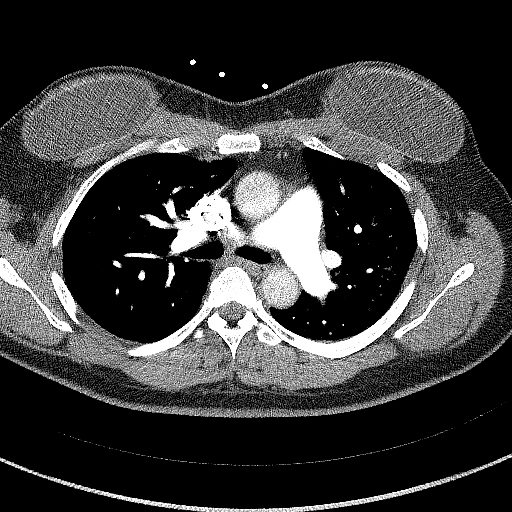

[Series 8: thins · axial · 0.59mm/px · z∈[-663,-430]mm · 8 of 429 slices shown]
[im 48/429  lung]
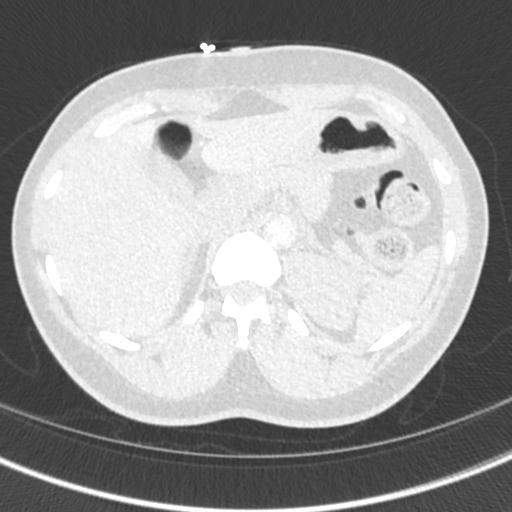
[im 96/429  lung]
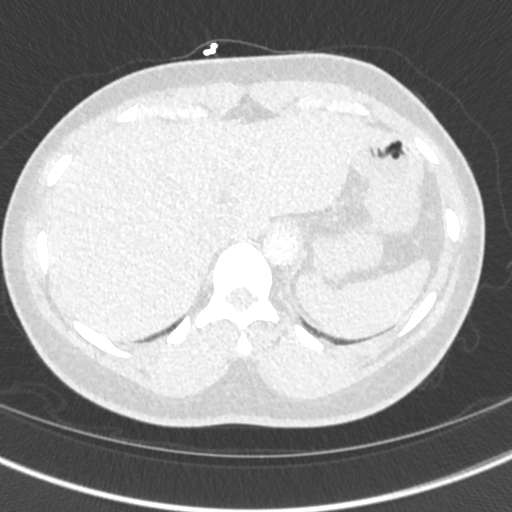
[im 143/429  lung]
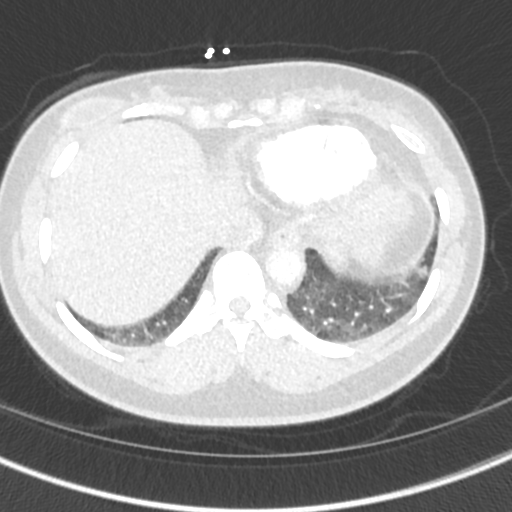
[im 191/429  lung]
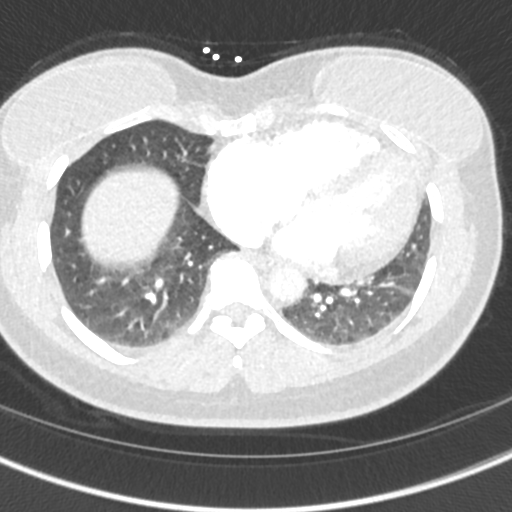
[im 238/429  lung]
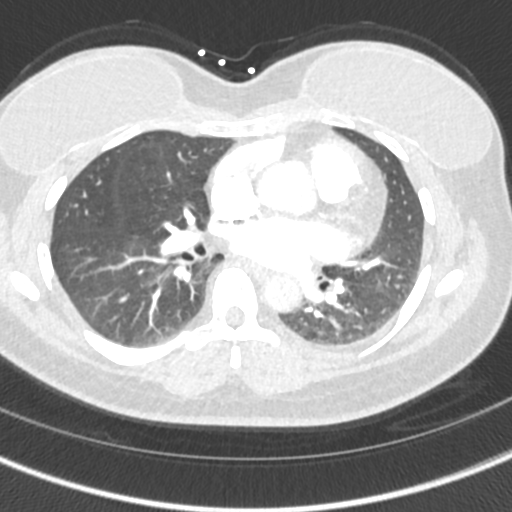
[im 286/429  lung]
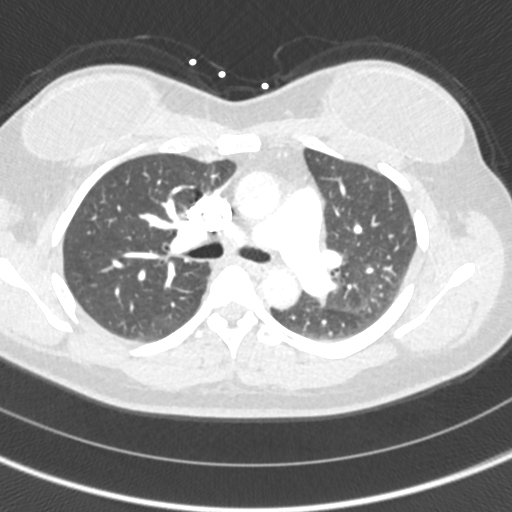
[im 333/429  lung]
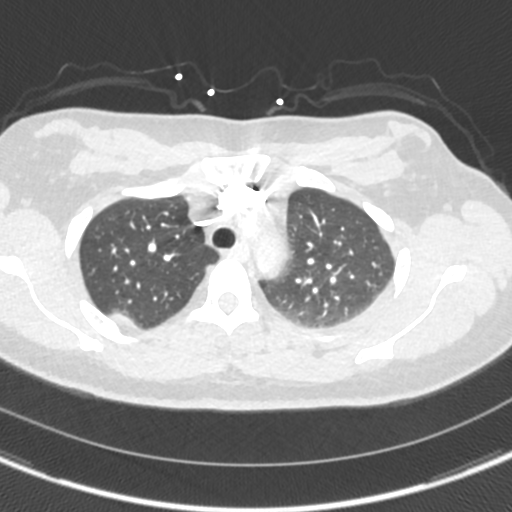
[im 381/429  lung]
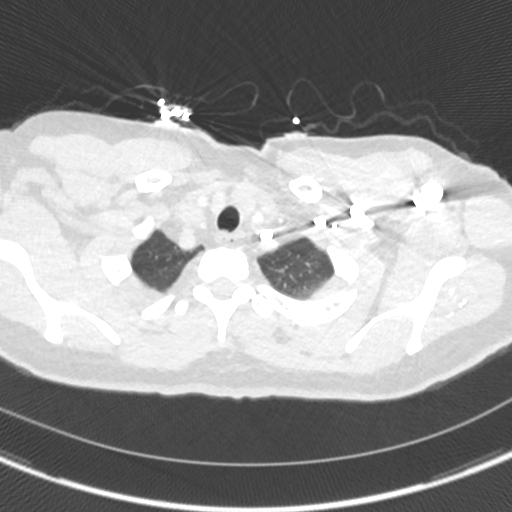

[Series 9: cor · coronal · 0.60mm/px · 3 of 119 slices shown]
[im 30/119  soft-tissue]
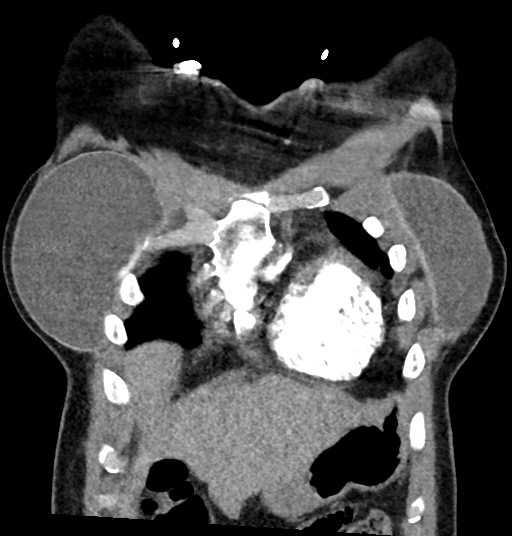
[im 60/119  soft-tissue]
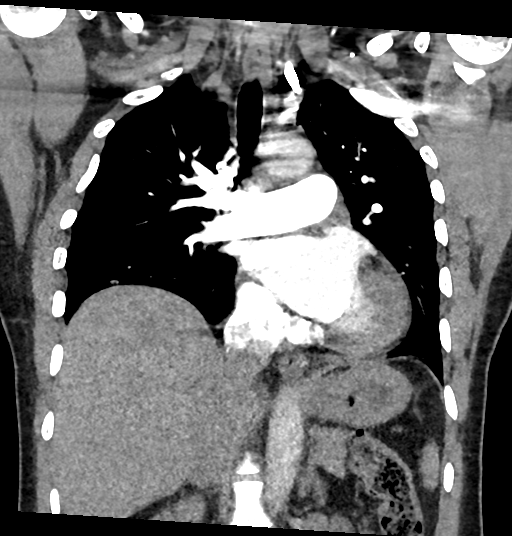
[im 89/119  soft-tissue]
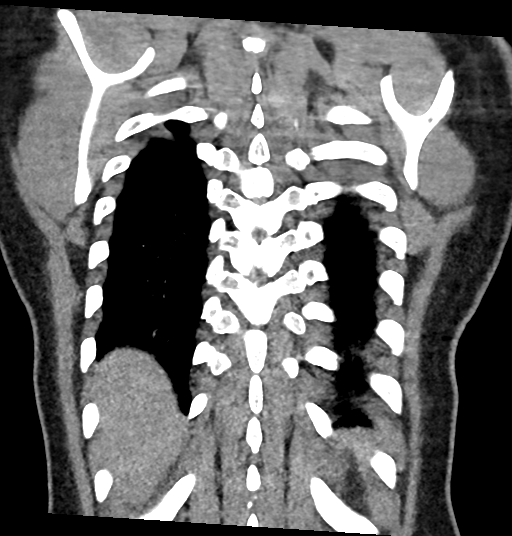

[19 of 46 positions shown; findings below may reference images not displayed]

RADIATION DOSE REDUCTION: This exam was performed according to the
departmental dose-optimization program which includes automated
exposure control, adjustment of the mA and/or kV according to
patient size and/or use of iterative reconstruction technique.

CONTRAST:  75mL OMNIPAQUE IOHEXOL 350 MG/ML SOLN
FINDINGS: Cardiovascular: Satisfactory opacification of the pulmonary arteries
to the segmental level. No evidence of pulmonary embolism. Normal
heart size. No pericardial effusion.

Mediastinum/Nodes: No enlarged mediastinal, hilar, or axillary lymph
nodes. Thyroid gland, trachea, and esophagus demonstrate no
significant findings.

Lungs/Pleura: Subsegmental left basilar atelectasis. No
consolidation. No pleural effusions or pneumothorax

Upper Abdomen: No acute abnormality.

Musculoskeletal: No acute fracture.  Bilateral breast implants.

Review of the MIP images confirms the above findings.
IMPRESSION: 1. No evidence of acute pulmonary embolism.
2. Subsegmental left lower lobe atelectasis.

## 2024-05-28 ENCOUNTER — Telehealth: Payer: Self-pay

## 2024-05-28 ENCOUNTER — Telehealth: Payer: Self-pay | Admitting: Internal Medicine

## 2024-05-28 NOTE — Telephone Encounter (Signed)
°  Patient Consent for Virtual Visit        Kelsey Klein has provided verbal consent on 05/28/2024 for a virtual visit (video or telephone).   CONSENT FOR VIRTUAL VISIT FOR:  Kelsey Klein  By participating in this virtual visit I agree to the following:  I hereby voluntarily request, consent and authorize [facility] and its employed or contracted physicians, physician assistants, nurse practitioners or other licensed health care professionals (the Practitioner), to provide me with telemedicine health care services (the Services) as deemed necessary by the treating Practitioner. I acknowledge and consent to receive the Services by the Practitioner via telemedicine. I understand that the telemedicine visit will involve communicating with the Practitioner through live audiovisual communication technology and the disclosure of certain medical information by electronic transmission. I acknowledge that I have been given the opportunity to request an in-person assessment or other available alternative prior to the telemedicine visit and am voluntarily participating in the telemedicine visit.  I understand that I have the right to withhold or withdraw my consent to the use of telemedicine in the course of my care at any time, without affecting my right to future care or treatment, and that the Practitioner or I may terminate the telemedicine visit at any time. I understand that I have the right to inspect all information obtained and/or recorded in the course of the telemedicine visit and may receive copies of available information for a reasonable fee.  I understand that some of the potential risks of receiving the Services via telemedicine include:  Delay or interruption in medical evaluation due to technological equipment failure or disruption; Information transmitted may not be sufficient (e.g. poor resolution of images) to allow for appropriate medical decision making by the  Practitioner; and/or  In rare instances, security protocols could fail, causing a breach of personal health information.  Furthermore, I acknowledge that it is my responsibility to provide information about my medical history, conditions and care that is complete and accurate to the best of my ability. I acknowledge that Practitioner's advice, recommendations, and/or decision may be based on factors not within their control, such as incomplete or inaccurate data provided by me or distortions of diagnostic images or specimens that may result from electronic transmissions. I understand that the practice of medicine is not an exact science and that Practitioner makes no warranties or guarantees regarding treatment outcomes. I acknowledge that a copy of this consent can be made available to me via my patient portal [facility] MyChart), or I can request a printed copy by calling the office of  [facility].    I understand that my insurance will be billed for this visit.   I have read or had this consent read to me. I understand the contents of this consent, which adequately explains the benefits and risks of the Services being provided via telemedicine.  I have been provided ample opportunity to ask questions regarding this consent and the Services and have had my questions answered to my satisfaction. I give my informed consent for the services to be provided through the use of telemedicine in my medical care

## 2024-05-28 NOTE — Telephone Encounter (Signed)
 Preop tele appt now scheduled, med rec and consent done.

## 2024-05-28 NOTE — Telephone Encounter (Signed)
° °  Pre-operative Risk Assessment    Patient Name: Kelsey Klein  DOB: June 29, 1970 MRN: [id number]   Date of last office visit: 12/16/23 Date of next office visit: n/a   Request for Surgical Clearance    Procedure:  bilateral lower blepharoplasty  Date of Surgery:  Clearance 08/20/24                                Surgeon:  Dr Laurance Mettu Surgeon's Group or Practice Name:  [facility] Phone number:  [phone] Fax number:  [phone]   Type of Clearance Requested:   - Medical    Type of Anesthesia:  Not Indicated   Additional requests/questions:    Bonney Rosina Stamps   05/28/2024, 2:54 PM

## 2024-05-28 NOTE — Telephone Encounter (Signed)
° °  Name: Kelsey Klein  DOB: 11/02/1970  MRN: [id number]  Primary Cardiologist: Lonni Hanson, MD   Preoperative team, please contact this patient and set up a phone call appointment for further preoperative risk assessment. Please obtain consent and complete medication review. Thank you for your help.  I confirm that guidance regarding antiplatelet and oral anticoagulation therapy has been completed and, if necessary, noted below.  Per office protocol, if patient is without any new symptoms or concerns at the time of their virtual visit,she may hold aspirin  for 7 days prior to procedure. Please resume aspirin  as soon as possible postprocedure, at the discretion of the surgeon.    I also confirmed the patient resides in the state of [place] . As per [facility] Medical Board telemedicine laws, the patient must reside in the state in which the provider is licensed.   Lamarr Satterfield, NP 05/28/2024, 3:01 PM [facility]

## 2024-06-02 NOTE — Telephone Encounter (Signed)
 Dr office called to f/u please advise

## 2024-06-22 NOTE — Telephone Encounter (Signed)
 Patient is following up. She would like to know if her telephone appointment for clearance can be moved up. Please advise.

## 2024-06-22 NOTE — Telephone Encounter (Signed)
 I s/w the pt and she tells me that the surgeon office asked if her tele visit could be moved up sooner as only giving 1 week was not enough time for their office. I have moved tele preop appt 07/14/24, pt agrees to this.   I assured the pt that I will update the surgeon office as well.

## 2024-07-05 ENCOUNTER — Encounter: Admitting: Internal Medicine

## 2024-07-12 ENCOUNTER — Ambulatory Visit: Admitting: Family Medicine

## 2024-07-13 NOTE — Progress Notes (Unsigned)
"  ° °  Virtual Visit via Telephone Note for Surgical Clearance    Because of Kelsey Klein co-morbid illnesses, she is at least at moderate risk for complications without adequate follow up.  This format is felt to be most appropriate for this patient at this time.  Due to technical limitations with video connection web designer), today's appointment will be conducted as an audio only telehealth visit, and Kelsey Klein verbally agreed to proceed in this manner.   All issues noted in this document were discussed and addressed.  No physical exam could be performed with this format.  Evaluation Performed:  Preoperative cardiovascular risk assessment _____________   Date:  07/14/2024   Patient ID:  Kelsey Klein, DOB 06/10/1971, MRN [id number]  Patient Location:  Provider location:  Home Office   Primary Care Provider:  Bernardo Fend, DO Primary Cardiologist:  Lonni Hanson, MD  Patient Profile  Coronary artery disease  NSTEMI s/p 2.25 x 18 mm DES to LAD in 01/2022 TTE 02/08/22: EF 55, mild MR PET MPI 11/20/23: low risk Hyperlipidemia  Breast CA  History of Present Illness    Kelsey Klein is a 54 y.o. female who presents via audio/video conferencing for a telehealth visit today.   Pt was last seen in cardiology clinic on 12/16/23 by Barnie Hila, NP.  At that time Kelsey Klein was doing well .    Pending Surgery: bilateral lower blepharoplasty  Type of anesthesia: Not Indicated  Medications to be held: n/a  Since her last visit, she has done well without chest pain, unusual shortness of breath or syncope.   Physical Exam  Vital Signs:  Kelsey Klein does not have vital signs available for review today. Given telephonic nature of communication, physical exam is limited. AAOx3. NAD. Normal affect.  Speech and respirations are unlabored.  05/26/23: SCr 0.84  Assessment & Plan   Assessment & Plan Preoperative cardiovascular examination Kelsey Klein's  perioperative risk of a major cardiac event is 0.9% according to the Revised Cardiac Risk Index (RCRI).  Therefore, she is at low risk for perioperative complications.   Her functional capacity is good at 4.31 METs according to the Duke Activity Status Index (DASI). Recommendations: According to ACC/AHA guidelines, no further cardiovascular testing needed.  The patient may proceed to surgery at acceptable risk.   Ideally, Aspirin  should be continued without interruption to reduce the risk of stent thrombosis. However, if the bleeding risk is felt to be too great, Aspirin  can be held for 7 days and resumed as soon as possible.     A copy of this note will be routed to requesting surgeon.  Time:   Today, I have spent 4 minutes with the patient with telehealth technology discussing medical history, symptoms, and management plan.     Glendia Ferrier, PA-C 07/14/2024, 8:44 AM  "

## 2024-07-14 ENCOUNTER — Ambulatory Visit: Attending: Cardiology | Admitting: Physician Assistant

## 2024-07-14 DIAGNOSIS — Z01810 Encounter for preprocedural cardiovascular examination: Secondary | ICD-10-CM

## 2024-07-14 NOTE — Telephone Encounter (Signed)
 Notes faxed to surgeon.  Glendia Ferrier, PA-C  07/14/2024 4:23 PM

## 2024-07-16 ENCOUNTER — Telehealth: Payer: Self-pay | Admitting: Internal Medicine

## 2024-07-16 NOTE — Telephone Encounter (Signed)
 Efaxed notes to [place] from [facility]

## 2024-07-16 NOTE — Telephone Encounter (Signed)
 Last EKG and office note faxed to [facility] from [facility]

## 2024-07-16 NOTE — Telephone Encounter (Signed)
 Office states they received the document that EKG was done, but not the actually results/notes from EKG. Please advise

## 2024-08-06 ENCOUNTER — Ambulatory Visit: Admitting: Family Medicine

## 2024-08-09 ENCOUNTER — Ambulatory Visit
